# Patient Record
Sex: Female | Born: 1955
Health system: Southern US, Community
[De-identification: ages and names within clinical notes are randomized; demographics above are authoritative.]

## PROBLEM LIST (undated history)

## (undated) DIAGNOSIS — I471 Supraventricular tachycardia, unspecified: Secondary | ICD-10-CM

## (undated) DIAGNOSIS — E663 Overweight: Secondary | ICD-10-CM

## (undated) DIAGNOSIS — G4730 Sleep apnea, unspecified: Secondary | ICD-10-CM

## (undated) DIAGNOSIS — F419 Anxiety disorder, unspecified: Secondary | ICD-10-CM

## (undated) DIAGNOSIS — G4700 Insomnia, unspecified: Secondary | ICD-10-CM

## (undated) DIAGNOSIS — C50919 Malignant neoplasm of unspecified site of unspecified female breast: Secondary | ICD-10-CM

## (undated) DIAGNOSIS — M1990 Unspecified osteoarthritis, unspecified site: Secondary | ICD-10-CM

## (undated) DIAGNOSIS — M549 Dorsalgia, unspecified: Secondary | ICD-10-CM

## (undated) DIAGNOSIS — Z923 Personal history of irradiation: Secondary | ICD-10-CM

## (undated) DIAGNOSIS — I4719 Other supraventricular tachycardia: Secondary | ICD-10-CM

## (undated) DIAGNOSIS — K529 Noninfective gastroenteritis and colitis, unspecified: Secondary | ICD-10-CM

## (undated) DIAGNOSIS — E785 Hyperlipidemia, unspecified: Secondary | ICD-10-CM

## (undated) HISTORY — PX: COLONOSCOPY: SHX174

## (undated) HISTORY — DX: Supraventricular tachycardia, unspecified: I47.10

## (undated) HISTORY — DX: Anxiety disorder, unspecified: F41.9

## (undated) HISTORY — PX: TUBAL LIGATION: SHX77

## (undated) HISTORY — DX: Overweight: E66.3

## (undated) HISTORY — DX: Hyperlipidemia, unspecified: E78.5

## (undated) HISTORY — PX: CYSTOSCOPY: SUR368

## (undated) HISTORY — DX: Supraventricular tachycardia: I47.1

---

## 1998-03-13 ENCOUNTER — Other Ambulatory Visit: Admission: RE | Admit: 1998-03-13 | Discharge: 1998-03-13 | Payer: Self-pay | Admitting: Obstetrics and Gynecology

## 1999-05-02 ENCOUNTER — Encounter (INDEPENDENT_AMBULATORY_CARE_PROVIDER_SITE_OTHER): Payer: Self-pay | Admitting: Specialist

## 1999-05-02 ENCOUNTER — Other Ambulatory Visit: Admission: RE | Admit: 1999-05-02 | Discharge: 1999-05-02 | Payer: Self-pay | Admitting: Surgery

## 1999-05-02 HISTORY — PX: BREAST BIOPSY: SHX20

## 2000-03-16 ENCOUNTER — Encounter: Admission: RE | Admit: 2000-03-16 | Discharge: 2000-03-16 | Payer: Self-pay | Admitting: Internal Medicine

## 2000-03-16 ENCOUNTER — Encounter: Payer: Self-pay | Admitting: Internal Medicine

## 2004-02-16 ENCOUNTER — Emergency Department (HOSPITAL_COMMUNITY): Admission: EM | Admit: 2004-02-16 | Discharge: 2004-02-16 | Payer: Self-pay | Admitting: Emergency Medicine

## 2004-03-26 ENCOUNTER — Encounter: Payer: Self-pay | Admitting: Surgery

## 2004-03-28 ENCOUNTER — Inpatient Hospital Stay (HOSPITAL_COMMUNITY): Admission: RE | Admit: 2004-03-28 | Discharge: 2004-03-30 | Payer: Self-pay | Admitting: Surgery

## 2004-03-28 ENCOUNTER — Ambulatory Visit: Payer: Self-pay | Admitting: Internal Medicine

## 2004-06-03 ENCOUNTER — Ambulatory Visit: Payer: Self-pay | Admitting: Internal Medicine

## 2008-12-30 ENCOUNTER — Emergency Department (HOSPITAL_COMMUNITY): Admission: EM | Admit: 2008-12-30 | Discharge: 2008-12-30 | Payer: Self-pay | Admitting: Emergency Medicine

## 2010-06-15 ENCOUNTER — Encounter: Payer: Self-pay | Admitting: Surgery

## 2010-07-10 ENCOUNTER — Encounter (HOSPITAL_COMMUNITY): Payer: Self-pay

## 2010-07-18 ENCOUNTER — Ambulatory Visit (HOSPITAL_COMMUNITY)
Admission: RE | Admit: 2010-07-18 | Discharge: 2010-07-18 | Disposition: A | Discharge status: Home / Self Care | Payer: 59 | Source: Ambulatory Visit | Attending: Interventional Cardiology | Admitting: Interventional Cardiology

## 2010-07-18 DIAGNOSIS — R000 Tachycardia, unspecified: Secondary | ICD-10-CM | POA: Insufficient documentation

## 2010-07-26 ENCOUNTER — Other Ambulatory Visit (HOSPITAL_COMMUNITY): Payer: Self-pay | Admitting: Sports Medicine

## 2010-07-26 DIAGNOSIS — S83209A Unspecified tear of unspecified meniscus, current injury, unspecified knee, initial encounter: Secondary | ICD-10-CM

## 2010-08-06 ENCOUNTER — Other Ambulatory Visit (HOSPITAL_COMMUNITY): Payer: 59

## 2010-10-11 NOTE — Discharge Summary (Signed)
NAME:  Pamela Underwood, Pamela Underwood             ACCOUNT NO.:  [id number]   MEDICAL RECORD NO.:  [id number]          PATIENT TYPE:  INP   LOCATION:  0359                         FACILITY:  [facility]   PHYSICIAN:  Thornton Park. Daphine Deutscher, MD  DATE OF BIRTH:  Sep 28, 1955   DATE OF ADMISSION:  03/27/2004  DATE OF DISCHARGE:  03/30/2004                                 DISCHARGE SUMMARY   CHIEF COMPLAINT:  Fever, increasing truncal and extremity rash.   HISTORY OF PRESENT ILLNESS:  Pamela Underwood is a 55 year old white female,  [employer] nurse, who had the onset of a rash beginning about 3 days  prior to admission.  This spread under her trunk and then later to her  extremities.  She was seen at [facility] and received some  Levaquin (2 doses) and a steroid dose-pack which she had began taking  earlier on the day of admission.  Today, the day of admission, she began  having a fever up to 101 and was seen by me at the home and was subsequently  referred for labs at [facility].  Her white count was noted to have a count  of 18,100 with 93% granulocytes and no eosinophils.  Urine was positive for  blood, and my concern was that this could possibly be a manifestation of  sepsis and not allergic reaction.   PAST MEDICAL HISTORY:   CURRENT MEDICATIONS:  1.  Diltiazem ER 180 mg daily.  2.  Toprol XL 50 mg daily.  3.  Fleconide per Dr. Evette Georges since September 2005 for PAT.   ALLERGIES:  The patient has no known allergies.   There was also a question of an anxiolytic which she had taken and started  recently, but I do not have the name of that.   PRIOR SURGERY:  1.  Left breast biopsy many years ago.  2.  Three live vaginal births.   PHYSICAL EXAMINATION:  GENERAL:  Florid rash, irregularly raised on the  trunk and extremities but no petechiae with minimal itching.  HEENT:  Remarkable.  There was no conjunctivitis and no petechiae.  NECK:  Supple without meningismus.  CHEST:  Clear  to auscultation.  HEART:  Sinus rhythm without murmurs or gallops.  ABDOMEN:  Nontender.  EXTREMITIES:  Full range of motion except for the rash as noted.  __________  Fever rash, elevated white count.  Work-up for infectious autoimmune or  allergic reaction.   HOSPITAL COURSE:  Ms. Hosack was seen initially and admitted to the [facility]  ER but transferred to [facility] because of a lack of beds.  She was seen by the  [facility], and I called Dr. Merlene Laughter, who was potentially  covering for Dr. Amil Amen.  Dr. Rito Ehrlich saw the patient in consultation as  well.  I obtained a consultation from Dr. Donzetta Starch, who saw her rash and  was concerned this was more of an allergic reaction, likely secondary to  medication hypersensitivity reaction.  He felt it was not consistent with  [facility] Fever or any other etiologic agent such as that.  He  felt the etiology agent was most likely the antidepressant medicine she had  started 2 weeks prior to this eruption.  The name of the medicine was not  known by the patient.   She continued to have this rash and become more florid.  She had 3 sweats  one night, and I got Cliffton Asters, MD, to see her, who felt it was probably  a resolving drug reaction as well.  She was to continue on the 10 day  steroid taper.  She was ready to be discharged on November 5 and was  discharged by Dr. Jamey Ripa.  She was to take steroid prednisone taper at home.  We will be following her up in the office via the office.   FINAL DIAGNOSIS:  Severe allergic reaction, possibly secondary to unknown  antidepressant that the patient was started on back in September.     Matt   MBM/MEDQ  D:  04/10/2004  T:  04/11/2004  Job:  [id number]   cc:   Evette Georges, MD   Rocco Serene, M.D.  [address] [place]  [place] [address]  Fax: [phone]   Cliffton Asters, M.D.  [address] [place]  [place] [address]  Fax: [phone]

## 2010-11-11 ENCOUNTER — Other Ambulatory Visit: Payer: Self-pay | Admitting: Sports Medicine

## 2010-11-11 DIAGNOSIS — M545 Low back pain: Secondary | ICD-10-CM

## 2010-11-16 ENCOUNTER — Ambulatory Visit
Admission: RE | Admit: 2010-11-16 | Discharge: 2010-11-16 | Disposition: A | Discharge status: Home / Self Care | Payer: 59 | Source: Ambulatory Visit | Attending: Sports Medicine | Admitting: Sports Medicine

## 2010-11-16 DIAGNOSIS — M545 Low back pain: Secondary | ICD-10-CM

## 2010-11-28 ENCOUNTER — Encounter: Payer: Self-pay | Admitting: Family Medicine

## 2011-01-10 ENCOUNTER — Ambulatory Visit: Payer: 59 | Attending: Sports Medicine | Admitting: Physical Therapy

## 2011-01-10 DIAGNOSIS — R5381 Other malaise: Secondary | ICD-10-CM | POA: Insufficient documentation

## 2011-01-10 DIAGNOSIS — M545 Low back pain, unspecified: Secondary | ICD-10-CM | POA: Insufficient documentation

## 2011-01-10 DIAGNOSIS — M6281 Muscle weakness (generalized): Secondary | ICD-10-CM | POA: Insufficient documentation

## 2011-01-10 DIAGNOSIS — IMO0001 Reserved for inherently not codable concepts without codable children: Secondary | ICD-10-CM | POA: Insufficient documentation

## 2011-01-13 ENCOUNTER — Ambulatory Visit: Payer: 59

## 2011-01-15 ENCOUNTER — Ambulatory Visit: Payer: 59 | Admitting: Physical Therapy

## 2011-01-20 ENCOUNTER — Ambulatory Visit: Payer: 59

## 2011-01-23 ENCOUNTER — Ambulatory Visit: Payer: 59 | Admitting: Physical Therapy

## 2011-01-28 ENCOUNTER — Encounter: Payer: 59 | Admitting: Physical Therapy

## 2011-01-30 ENCOUNTER — Encounter: Payer: 59 | Admitting: Physical Therapy

## 2011-02-06 ENCOUNTER — Ambulatory Visit: Payer: 59 | Attending: Sports Medicine | Admitting: Physical Therapy

## 2011-02-06 DIAGNOSIS — M545 Low back pain, unspecified: Secondary | ICD-10-CM | POA: Insufficient documentation

## 2011-02-06 DIAGNOSIS — IMO0001 Reserved for inherently not codable concepts without codable children: Secondary | ICD-10-CM | POA: Insufficient documentation

## 2011-02-06 DIAGNOSIS — R5381 Other malaise: Secondary | ICD-10-CM | POA: Insufficient documentation

## 2011-02-06 DIAGNOSIS — M6281 Muscle weakness (generalized): Secondary | ICD-10-CM | POA: Insufficient documentation

## 2011-02-11 ENCOUNTER — Ambulatory Visit: Payer: 59 | Admitting: Physical Therapy

## 2011-02-17 ENCOUNTER — Ambulatory Visit: Payer: 59

## 2011-02-25 ENCOUNTER — Encounter: Payer: 59 | Admitting: Physical Therapy

## 2011-02-27 ENCOUNTER — Encounter: Payer: 59 | Admitting: Physical Therapy

## 2011-03-04 ENCOUNTER — Encounter: Payer: 59 | Admitting: Physical Therapy

## 2011-03-06 ENCOUNTER — Encounter: Payer: 59 | Admitting: Physical Therapy

## 2011-03-19 ENCOUNTER — Ambulatory Visit: Payer: 59 | Attending: Sports Medicine

## 2011-03-19 DIAGNOSIS — M6281 Muscle weakness (generalized): Secondary | ICD-10-CM | POA: Insufficient documentation

## 2011-03-19 DIAGNOSIS — R5381 Other malaise: Secondary | ICD-10-CM | POA: Insufficient documentation

## 2011-03-19 DIAGNOSIS — M545 Low back pain, unspecified: Secondary | ICD-10-CM | POA: Insufficient documentation

## 2011-03-19 DIAGNOSIS — IMO0001 Reserved for inherently not codable concepts without codable children: Secondary | ICD-10-CM | POA: Insufficient documentation

## 2011-03-26 ENCOUNTER — Ambulatory Visit: Payer: 59

## 2011-07-15 ENCOUNTER — Encounter: Payer: Self-pay | Admitting: Gastroenterology

## 2011-08-13 ENCOUNTER — Encounter: Payer: 59 | Admitting: Gastroenterology

## 2011-08-20 ENCOUNTER — Encounter (HOSPITAL_COMMUNITY): Payer: Self-pay | Admitting: *Deleted

## 2011-08-20 ENCOUNTER — Inpatient Hospital Stay (HOSPITAL_COMMUNITY)
Admission: EM | Admit: 2011-08-20 | Discharge: 2011-08-23 | DRG: 392 | Disposition: A | Discharge status: Home / Self Care | Payer: 59 | Attending: General Surgery | Admitting: General Surgery

## 2011-08-20 ENCOUNTER — Emergency Department (HOSPITAL_COMMUNITY): Payer: 59

## 2011-08-20 DIAGNOSIS — F411 Generalized anxiety disorder: Secondary | ICD-10-CM | POA: Diagnosis present

## 2011-08-20 DIAGNOSIS — R109 Unspecified abdominal pain: Secondary | ICD-10-CM

## 2011-08-20 DIAGNOSIS — K5289 Other specified noninfective gastroenteritis and colitis: Principal | ICD-10-CM | POA: Diagnosis present

## 2011-08-20 DIAGNOSIS — Z79899 Other long term (current) drug therapy: Secondary | ICD-10-CM

## 2011-08-20 HISTORY — DX: Supraventricular tachycardia: I47.1

## 2011-08-20 HISTORY — DX: Other supraventricular tachycardia: I47.19

## 2011-08-20 LAB — BASIC METABOLIC PANEL
BUN: 13 mg/dL (ref 6–23)
Calcium: 10.6 mg/dL — ABNORMAL HIGH (ref 8.4–10.5)
Creatinine, Ser: 0.64 mg/dL (ref 0.50–1.10)
GFR calc Af Amer: >90 mL/min (ref >90)
GFR calc non Af Amer: >90 mL/min (ref >90)
Glucose, Bld: 125 mg/dL — ABNORMAL HIGH (ref 70–99)
Potassium: 4.2 mEq/L (ref 3.5–5.1)

## 2011-08-20 LAB — URINALYSIS, ROUTINE W REFLEX MICROSCOPIC
Glucose, UA: NEGATIVE mg/dL
Leukocytes, UA: NEGATIVE
Specific Gravity, Urine: 1.014 (ref 1.005–1.030)
pH: 7 (ref 5.0–8.0)

## 2011-08-20 LAB — CBC
Hemoglobin: 14.5 g/dL (ref 12.0–15.0)
MCH: 32 pg (ref 26.0–34.0)
Platelets: 187 10*3/uL (ref 150–400)
RBC: 4.53 MIL/uL (ref 3.87–5.11)
WBC: 15.2 10*3/uL — ABNORMAL HIGH (ref 4.0–10.5)

## 2011-08-20 LAB — URINE MICROSCOPIC-ADD ON

## 2011-08-20 LAB — DIFFERENTIAL
Eosinophils Absolute: 0.1 10*3/uL (ref 0.0–0.7)
Lymphocytes Relative: 10 % — ABNORMAL LOW (ref 12–46)
Lymphs Abs: 1.6 10*3/uL (ref 0.7–4.0)
Monocytes Relative: 5 % (ref 3–12)
Neutrophils Relative %: 84 % — ABNORMAL HIGH (ref 43–77)

## 2011-08-20 MED ORDER — ACETAMINOPHEN 650 MG RE SUPP
RECTAL | Status: AC
  Filled 2011-08-20: qty 1

## 2011-08-20 MED ORDER — HYDROMORPHONE HCL PF 1 MG/ML IJ SOLN
1.0000 mg | Freq: Once | INTRAMUSCULAR | Status: AC
  Administered 2011-08-20: 1 mg via INTRAVENOUS

## 2011-08-20 MED ORDER — IOHEXOL 300 MG/ML  SOLN
20.0000 mL | INTRAMUSCULAR | Status: AC
  Administered 2011-08-20 (×2): 20 mL via ORAL

## 2011-08-20 MED ORDER — HEPARIN SODIUM (PORCINE) 5000 UNIT/ML IJ SOLN
5000.0000 [IU] | Freq: Three times a day (TID) | INTRAMUSCULAR | Status: DC
  Administered 2011-08-20 – 2011-08-23 (×8): 5000 [IU] via SUBCUTANEOUS
  Filled 2011-08-20 (×13): qty 1

## 2011-08-20 MED ORDER — HYDROMORPHONE HCL PF 1 MG/ML IJ SOLN
1.0000 mg | Freq: Once | INTRAMUSCULAR | Status: AC
  Administered 2011-08-20: 1 mg via INTRAVENOUS
  Filled 2011-08-20: qty 1

## 2011-08-20 MED ORDER — ZOLPIDEM TARTRATE 5 MG PO TABS
5.0000 mg | ORAL_TABLET | Freq: Every evening | ORAL | Status: DC | PRN
  Administered 2011-08-20 – 2011-08-23 (×3): 5 mg via ORAL
  Filled 2011-08-20 (×3): qty 1

## 2011-08-20 MED ORDER — KCL IN DEXTROSE-NACL 20-5-0.9 MEQ/L-%-% IV SOLN
INTRAVENOUS | Status: DC
  Administered 2011-08-20 – 2011-08-22 (×3): via INTRAVENOUS
  Filled 2011-08-20 (×8): qty 1000

## 2011-08-20 MED ORDER — CIPROFLOXACIN IN D5W 400 MG/200ML IV SOLN
400.0000 mg | Freq: Two times a day (BID) | INTRAVENOUS | Status: DC
  Administered 2011-08-20 – 2011-08-23 (×6): 400 mg via INTRAVENOUS
  Filled 2011-08-20 (×7): qty 200

## 2011-08-20 MED ORDER — PANTOPRAZOLE SODIUM 40 MG IV SOLR
40.0000 mg | Freq: Two times a day (BID) | INTRAVENOUS | Status: DC
  Administered 2011-08-20 – 2011-08-23 (×6): 40 mg via INTRAVENOUS
  Filled 2011-08-20 (×9): qty 40

## 2011-08-20 MED ORDER — PROPAFENONE HCL ER 225 MG PO CP12
225.0000 mg | ORAL_CAPSULE | Freq: Two times a day (BID) | ORAL | Status: DC
  Administered 2011-08-20 – 2011-08-23 (×6): 225 mg via ORAL
  Filled 2011-08-20 (×8): qty 1

## 2011-08-20 MED ORDER — ONDANSETRON HCL 4 MG/2ML IJ SOLN: 4.0000 mg | Freq: Four times a day (QID) | INTRAMUSCULAR | Status: DC | PRN

## 2011-08-20 MED ORDER — METRONIDAZOLE IN NACL 5-0.79 MG/ML-% IV SOLN
500.0000 mg | Freq: Three times a day (TID) | INTRAVENOUS | Status: DC
  Administered 2011-08-20 – 2011-08-23 (×9): 500 mg via INTRAVENOUS
  Filled 2011-08-20 (×12): qty 100

## 2011-08-20 MED ORDER — ONDANSETRON HCL 4 MG/2ML IJ SOLN
4.0000 mg | Freq: Once | INTRAMUSCULAR | Status: AC
  Administered 2011-08-20: 4 mg via INTRAVENOUS
  Filled 2011-08-20: qty 2

## 2011-08-20 MED ORDER — HYDROCODONE-ACETAMINOPHEN 5-325 MG PO TABS
1.0000 | ORAL_TABLET | ORAL | Status: DC | PRN
  Administered 2011-08-20: 2 via ORAL
  Administered 2011-08-21: 1 via ORAL
  Administered 2011-08-21 (×2): 2 via ORAL
  Administered 2011-08-21: 1 via ORAL
  Administered 2011-08-22 (×3): 2 via ORAL
  Administered 2011-08-22: 1 via ORAL
  Administered 2011-08-22 – 2011-08-23 (×2): 2 via ORAL
  Filled 2011-08-20 (×2): qty 2
  Filled 2011-08-20: qty 1
  Filled 2011-08-20 (×3): qty 2
  Filled 2011-08-20: qty 1
  Filled 2011-08-20 (×5): qty 2

## 2011-08-20 MED ORDER — MORPHINE SULFATE 4 MG/ML IJ SOLN
4.0000 mg | Freq: Once | INTRAMUSCULAR | Status: AC
  Administered 2011-08-20: 4 mg via INTRAVENOUS

## 2011-08-20 MED ORDER — IOHEXOL 300 MG/ML  SOLN
100.0000 mL | Freq: Once | INTRAMUSCULAR | Status: AC | PRN
  Administered 2011-08-20: 100 mL via INTRAVENOUS

## 2011-08-20 MED ORDER — ACETAMINOPHEN 650 MG RE SUPP
650.0000 mg | Freq: Once | RECTAL | Status: AC
  Administered 2011-08-20: 650 mg via RECTAL

## 2011-08-20 MED ORDER — HYDROMORPHONE HCL PF 1 MG/ML IJ SOLN
INTRAMUSCULAR | Status: AC
  Filled 2011-08-20: qty 1

## 2011-08-20 MED ORDER — SODIUM CHLORIDE 0.9 % IV SOLN
Freq: Once | INTRAVENOUS | Status: AC
  Administered 2011-08-20: 12:00:00 via INTRAVENOUS

## 2011-08-20 MED ORDER — ALPRAZOLAM 0.5 MG PO TABS: 0.5000 mg | ORAL_TABLET | Freq: Every evening | ORAL | Status: DC | PRN

## 2011-08-20 MED ORDER — MORPHINE SULFATE 2 MG/ML IJ SOLN
2.0000 mg | INTRAMUSCULAR | Status: DC | PRN
  Administered 2011-08-20: 4 mg via INTRAVENOUS
  Administered 2011-08-20 (×3): 2 mg via INTRAVENOUS
  Administered 2011-08-21: 4 mg via INTRAVENOUS
  Filled 2011-08-20: qty 2
  Filled 2011-08-20 (×3): qty 1
  Filled 2011-08-20: qty 2

## 2011-08-20 MED ORDER — MORPHINE SULFATE 4 MG/ML IJ SOLN
INTRAMUSCULAR | Status: AC
  Filled 2011-08-20: qty 1

## 2011-08-20 NOTE — ED Notes (Signed)
Pt had some cramping this am and went to work and now with tenderness and pain to left side of abdomen and friend did not hear bowel sounds on that side

## 2011-08-20 NOTE — ED Notes (Signed)
Pt shivering and Pamela Underwood, EMT checked her temp and it was 101.9. Informed pt that we couldn't add more warm blankets with her current temperature. Pt stated that she understood.

## 2011-08-20 NOTE — ED Notes (Signed)
Pt states she was on a cruise for past week. llq pain started yesterday and worse today. Some diarrhea. No vomiting. Skin w/d, resp e/u.

## 2011-08-20 NOTE — H&P (Signed)
Pamela Underwood is an 56 y.o. female.   Chief Complaint: abdominal pain HPI: patient is a 56 year old female who awoke this morning feeling well. 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 Within about an hour this the came very severe. She describes a pressure or aching pain that waxes and wanes. She felt she needed to have a bowel movement but this did not help. The pain intensified and she presented to the emergency room. Since presenting in the emergency room she has had fever to 103. She has not had nausea or vomiting. Normal bowel movement yesterday. No melena or hematochezia. She has no history of significant chronic GI complaints were similar previous episodes. Travel history is significant for returning from a trip to [place] about 4 days ago. No 1 else in the family has been ill. No urinary symptoms appear  Past Medical History  Diagnosis Date  . Atrial tachycardia     Past Surgical History  Procedure Date  . Tubal ligation   . Back surgery     History reviewed. No pertinent family history. Social History:  reports that she has never smoked. She does not have any smokeless tobacco history on file. She reports that she drinks alcohol. She reports that she does not use illicit drugs.  Allergies:  Allergies  Allergen Reactions  . Lexapro Other (See Comments)    Hives, welts, all over skin rash    Medications Prior to Admission  Medication Dose Route Frequency Provider Last Rate Last Dose  . 0.9 %  sodium chloride infusion   Intravenous Once Toy Baker, MD 125 mL/hr at 08/20/11 1223    . acetaminophen (TYLENOL) 650 MG suppository           . acetaminophen (TYLENOL) suppository 650 mg  650 mg Rectal Once Dorthula Matas, PA   650 mg at 08/20/11 1317  . HYDROmorphone (DILAUDID) 1 MG/ML injection           . HYDROmorphone (DILAUDID) injection 1 mg  1 mg Intravenous Once  Toy Baker, MD   1 mg at 08/20/11 1215  . HYDROmorphone (DILAUDID) injection 1 mg  1 mg Intravenous Once Dorthula Matas, PA   1 mg at 08/20/11 1351  . HYDROmorphone (DILAUDID) injection 1 mg  1 mg Intravenous Once Dorthula Matas, PA   1 mg at 08/20/11 1614  . iohexol (OMNIPAQUE) 300 MG/ML solution 100 mL  100 mL Intravenous Once PRN Medication Radiologist, MD   100 mL at 08/20/11 1439  . iohexol (OMNIPAQUE) 300 MG/ML solution 20 mL  20 mL Oral Q1 Hr x 2 Medication Radiologist, MD   20 mL at 08/20/11 1353  . ondansetron (ZOFRAN) injection 4 mg  4 mg Intravenous Once Toy Baker, MD   4 mg at 08/20/11 1215   Medications Prior to Admission  Medication Sig Dispense Refill  . ALPRAZolam (XANAX) 0.5 MG tablet Take 0.5 mg by mouth at bedtime as needed. For stress/anxiety      . aspirin 81 MG tablet Take 81 mg by mouth daily.        Rhythmol bid Diltiazem daily doses unknown-pharmacy to verify    Results for orders placed during the hospital encounter of 08/20/11 (from the past 48 hour(s))  CBC     Status: Abnormal   Collection Time   08/20/11 12:04 PM  Component Value Range Comment   WBC 15.2 (*) 4.0 - 10.5 (K/uL)    RBC 4.53  3.87 - 5.11 (MIL/uL)    Hemoglobin 14.5  12.0 - 15.0 (g/dL)    HCT 21.3  08.6 - 57.8 (%)    MCV 92.1  78.0 - 100.0 (fL)    MCH 32.0  26.0 - 34.0 (pg)    MCHC 34.8  30.0 - 36.0 (g/dL)    RDW 46.9  62.9 - 52.8 (%)    Platelets 187  150 - 400 (K/uL)   DIFFERENTIAL     Status: Abnormal   Collection Time   08/20/11 12:04 PM      Component Value Range Comment   Neutrophils Relative 84 (*) 43 - 77 (%)    Neutro Abs 12.8 (*) 1.7 - 7.7 (K/uL)    Lymphocytes Relative 10 (*) 12 - 46 (%)    Lymphs Abs 1.6  0.7 - 4.0 (K/uL)    Monocytes Relative 5  3 - 12 (%)    Monocytes Absolute 0.8  0.1 - 1.0 (K/uL)    Eosinophils Relative 0  0 - 5 (%)    Eosinophils Absolute 0.1  0.0 - 0.7 (K/uL)    Basophils Relative 0  0 - 1 (%)    Basophils Absolute 0.0  0.0 - 0.1  (K/uL)   BASIC METABOLIC PANEL     Status: Abnormal   Collection Time   08/20/11 12:04 PM      Component Value Range Comment   Sodium 138  135 - 145 (mEq/L)    Potassium 4.2  3.5 - 5.1 (mEq/L)    Chloride 97  96 - 112 (mEq/L)    CO2 27  19 - 32 (mEq/L)    Glucose, Bld 125 (*) 70 - 99 (mg/dL)    BUN 13  6 - 23 (mg/dL)    Creatinine, Ser 4.13  0.50 - 1.10 (mg/dL)    Calcium 24.4 (*) 8.4 - 10.5 (mg/dL)    GFR calc non Af Amer >90  >90 (mL/min)    GFR calc Af Amer >90  >90 (mL/min)   URINALYSIS, ROUTINE W REFLEX MICROSCOPIC     Status: Abnormal   Collection Time   08/20/11 12:14 PM      Component Value Range Comment   Color, Urine YELLOW  YELLOW     APPearance CLEAR  CLEAR     Specific Gravity, Urine 1.014  1.005 - 1.030     pH 7.0  5.0 - 8.0     Glucose, UA NEGATIVE  NEGATIVE (mg/dL)    Hgb urine dipstick MODERATE (*) NEGATIVE     Bilirubin Urine NEGATIVE  NEGATIVE     Ketones, ur 15 (*) NEGATIVE (mg/dL)    Protein, ur NEGATIVE  NEGATIVE (mg/dL)    Urobilinogen, UA 1.0  0.0 - 1.0 (mg/dL)    Nitrite NEGATIVE  NEGATIVE     Leukocytes, UA NEGATIVE  NEGATIVE    URINE MICROSCOPIC-ADD ON     Status: Normal   Collection Time   08/20/11 12:14 PM      Component Value Range Comment   Squamous Epithelial / LPF RARE  RARE     WBC, UA 0-2  <3 (WBC/hpf)    RBC / HPF 7-10  <3 (RBC/hpf)    Bacteria, UA RARE  RARE     Ct Abdomen Pelvis W Contrast  08/20/2011  *RADIOLOGY REPORT*  Clinical Data: Abdominal pain  CT ABDOMEN AND PELVIS WITH CONTRAST  Technique:  Multidetector CT imaging of the abdomen and pelvis was performed following the standard protocol during bolus administration of intravenous contrast.  Contrast:  100 ml Omnipaque-300 IV  Comparison: None.  Findings: There is significant thickening of the proximal small bowel involving the fourth portion the duodenum, ligament of Treitz, and proximal jejunum. Mucosa is significantly thickened in this area but there is no stranding in the  mesentery.  No fluid or abscess is present.  Distal to this thickened segment is a segment of mildly dilated jejunum without wall thickening.  Remainder of the small bowel appears normal.  Appendix is normal and the colon is normal.  Lung bases are clear.  Liver and gallbladder and bile ducts are normal.  Pancreas spleen and kidneys are normal.  Chronic compression fracture of L2.  Disc degeneration at L5-S1.  IMPRESSION: Extensive thickening of the distal duodenum and proximal jejunum. Differential considerations include bowel ischemia and infection. Bowel tumor not considered likely.  No free fluid or adenopathy.  Original Report Authenticated By: Camelia Phenes, M.D.    Review of Systems  Constitutional: Positive for fever, chills and malaise/fatigue. Negative for weight loss.  Eyes: Negative.   Respiratory: Negative for cough and shortness of breath.   Cardiovascular: Positive for palpitations. Negative for chest pain.  Gastrointestinal: Positive for abdominal pain. Negative for heartburn, nausea, vomiting, diarrhea, constipation, blood in stool and melena.  Genitourinary: Negative.   Musculoskeletal: Negative.   Skin: Negative for rash.  Psychiatric/Behavioral: The patient is nervous/anxious.     Blood pressure 163/60, pulse 122, temperature 102.4 F (39.1 C), temperature source Oral, resp. rate 22, SpO2 98.00%. Physical Exam  General: Alert, mildly obese Caucasian female who appears uncomfortable. Skin: Warm and dry without rash or infection. HEENT: No palpable masses or thyromegaly. Sclera nonicteric. Pupils equal round and reactive. Oropharynx clear. Lymph nodes: No cervical, supraclavicular, or inguinal nodes palpable. Lungs: Breath sounds clear and equal without increased work of breathing Cardiovascular: moderate tachcardia without murmur. No JVD or edema. Peripheral pulses intact. Abdomen: Nondistended. Bowel sounds normal. There is localized moderate to marked tenderness in the  left upper quadrant with some guarding.. No masses palpable. No organomegaly. No palpable hernias. Extremities: No edema or joint swelling or deformity. No chronic venous stasis changes. Neurologic: Alert and fully oriented.  Assessment/Plan Acute left upper quadrant abdominal pain and fever with elevated white count. CT scan shows a significant enteritis involving the fourth portion of the duodenum and proximal jejunum. Differential includes infectious and ischemic etiologies. She has no risk factors or history of ischemia. This appears to be infectious, possibly bacterial or viral.  Patient will be admitted, IV antibiotics and bowel rest, IV Cipro and Flagyl with close observation.  Donis Pinder T 08/20/2011, 4:36 PM

## 2011-08-20 NOTE — ED Provider Notes (Signed)
History     CSN: [id number]  Arrival date & time 08/20/11  1113   First MD Initiated Contact with Patient 08/20/11 1145      Chief Complaint  Patient presents with  . Abdominal Pain    left side    (Consider location/radiation/quality/duration/timing/severity/associated sxs/prior treatment) Patient is a 56 y.o. female presenting with abdominal pain. The history is provided by the patient.  Abdominal Pain The primary symptoms of the illness include abdominal pain.   patient here with abdominal pain x1 day. Pain described as sharp localized to her left lower quadrant. Denies any fever, vomiting. Some constipation noted. No bloody stools. Denies any urinary symptoms. No prior history of same. Nothing makes her symptoms better or worse no medications used prior to arrival. She denies any vaginal bleeding or discharge  Past Medical History  Diagnosis Date  . Atrial tachycardia     Past Surgical History  Procedure Date  . Tubal ligation   . Back surgery     No family history on file.  History  Substance Use Topics  . Smoking status: Never Smoker   . Smokeless tobacco: Not on file  . Alcohol Use: Yes     occ    OB History    Grav Para Term Preterm Abortions TAB SAB Ect Mult Living                  Review of Systems  Gastrointestinal: Positive for abdominal pain.  All other systems reviewed and are negative.    Allergies  Lexapro  Home Medications   Current Outpatient Rx  Name Route Sig Dispense Refill  . ALPRAZOLAM 0.5 MG PO TABS Oral Take 0.5 mg by mouth at bedtime as needed.      . ASPIRIN 81 MG PO TABS Oral Take 81 mg by mouth daily.      Marland Kitchen VITAMIN D (CHOLECALCIFEROL) PO Oral Take 1 capsule by mouth daily.    Remus Loffler PO Oral Take 0.5 tablets by mouth at bedtime as needed.      BP 151/84  Pulse 122  Temp(Src) 98.9 F (37.2 C) (Oral)  Resp 20  SpO2 99%  Physical Exam  Nursing note and vitals reviewed. Constitutional: She is oriented to person,  place, and time. She appears well-developed and well-nourished.  Non-toxic appearance. No distress.  HENT:  Head: Normocephalic and atraumatic.  Eyes: Conjunctivae, EOM and lids are normal. Pupils are equal, round, and reactive to light.  Neck: Normal range of motion. Neck supple. No tracheal deviation present. No mass present.  Cardiovascular: Regular rhythm and normal heart sounds.  Tachycardia present.  Exam reveals no gallop.   No murmur heard. Pulmonary/Chest: Effort normal and breath sounds normal. No stridor. No respiratory distress. She has no decreased breath sounds. She has no wheezes. She has no rhonchi. She has no rales.  Abdominal: Soft. Normal appearance and bowel sounds are normal. She exhibits no distension. There is tenderness in the left lower quadrant. There is no rigidity, no rebound, no guarding and no CVA tenderness.  Musculoskeletal: Normal range of motion. She exhibits no edema and no tenderness.  Neurological: She is alert and oriented to person, place, and time. She has normal strength. No cranial nerve deficit or sensory deficit. GCS eye subscore is 4. GCS verbal subscore is 5. GCS motor subscore is 6.  Skin: Skin is warm and dry. No abrasion and no rash noted.  Psychiatric: Her speech is normal and behavior is normal. Her mood appears  anxious.    ED Course  Procedures (including critical care time)   Labs Reviewed  CBC  DIFFERENTIAL  URINALYSIS, ROUTINE W REFLEX MICROSCOPIC  URINE CULTURE  BASIC METABOLIC PANEL   No results found.   No diagnosis found.    MDM  Pt to have abd ct to r/o rupture diverticuli, will send to cdu to complete w/u        Toy Baker, MD 08/21/11 1818

## 2011-08-20 NOTE — ED Provider Notes (Signed)
Pt from [facility] triage with abdominal pain. The patient is now showing a fever of 101 and is tachycardic in the low 100's. 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 Daphine Deutscher, came to see patient in ED and feels that this is a rectal abscess. According to the nurse, he would like to be called with the CT abd/pelv results that are pending. The patient has been given 650 Acetaminophen suppository for fever and will be kept NPO until CT results.  Dr. Johna Sheriff [facility]) has evaluated patient and will be assuming care of the patient from this point.  Dorthula Matas, PA 08/20/11 1836

## 2011-08-20 NOTE — ED Notes (Signed)
Pt assisted to the bathroom and she urinated, no stool. Pt also c/o of h/a and asked for pain medication. T.GreenePA informed of pt's c/o and request.

## 2011-08-20 NOTE — ED Notes (Signed)
Called report to [place], Charity fundraiser.

## 2011-08-20 NOTE — ED Notes (Signed)
Pt was recently on cruise to [place] and develop some diarrhea but no pain until today. Pt having left sided abd pain and diarrhea. Pt comint to cdu and having ct scan with contrast.

## 2011-08-20 NOTE — ED Provider Notes (Signed)
Medical screening examination/treatment/procedure(s) were conducted as a shared visit with non-physician practitioner(s) and myself.  I personally evaluated the patient during the encounter  Toy Baker, MD 08/20/11 [phone]

## 2011-08-20 NOTE — ED Notes (Signed)
Pt given oral contrast to drink. On ascultation able to hear bowel sounds throughout, greater on the right than left. Pt reports that pain is decreased  Since receiving pain medication.

## 2011-08-20 NOTE — ED Notes (Signed)
Marlon Pel, PA into talk with pt about plan of care. General Surgery being paged currently.

## 2011-08-20 NOTE — ED Notes (Signed)
Radiology notified that pt finished drinking her contrast. Dr Sheron Nightingale here to see pt.

## 2011-08-21 LAB — CBC
HCT: 34.2 % — ABNORMAL LOW (ref 36.0–46.0)
Hemoglobin: 12.1 g/dL (ref 12.0–15.0)
MCH: 32.7 pg (ref 26.0–34.0)
MCHC: 35.4 g/dL (ref 30.0–36.0)
MCV: 92.4 fL (ref 78.0–100.0)
Platelets: 157 10*3/uL (ref 150–400)
RBC: 3.7 MIL/uL — ABNORMAL LOW (ref 3.87–5.11)
RDW: 12.3 % (ref 11.5–15.5)
WBC: 13.7 10*3/uL — ABNORMAL HIGH (ref 4.0–10.5)

## 2011-08-21 LAB — URINE CULTURE
Colony Count: NO GROWTH
Culture  Setup Time: 201303272243
Culture: NO GROWTH

## 2011-08-21 LAB — BASIC METABOLIC PANEL
BUN: 8 mg/dL (ref 6–23)
CO2: 28 mEq/L (ref 19–32)
Calcium: 8.8 mg/dL (ref 8.4–10.5)
Creatinine, Ser: 0.61 mg/dL (ref 0.50–1.10)
Glucose, Bld: 112 mg/dL — ABNORMAL HIGH (ref 70–99)

## 2011-08-21 MED ORDER — HYDROMORPHONE HCL PF 1 MG/ML IJ SOLN
1.0000 mg | INTRAMUSCULAR | Status: DC | PRN
  Administered 2011-08-21 (×2): 2 mg via INTRAVENOUS
  Administered 2011-08-21: 1 mg via INTRAVENOUS
  Filled 2011-08-21: qty 2
  Filled 2011-08-21: qty 1
  Filled 2011-08-21: qty 2

## 2011-08-21 NOTE — Progress Notes (Signed)
UR of chart complete.  

## 2011-08-21 NOTE — Progress Notes (Signed)
Patient ID: Pamela Underwood, female   DOB: November 13, 1955, 56 y.o.   MRN: [id number]    Subjective: Pain is a little better. No nausea  Objective: Vital signs in last 24 hours: Temp:  [97.7 F (36.5 C)-103.1 F (39.5 C)] 97.7 F (36.5 C) (03/28 0543) Pulse Rate:  [85-122] 85  (03/28 0543) Resp:  [18-22] 18  (03/28 0543) BP: (126-178)/(56-84) 126/78 mmHg (03/28 0543) SpO2:  [95 %-99 %] 95 % (03/28 0543) Last BM Date: 08/20/11  Intake/Output from previous day: 03/27 0701 - 03/28 0700 In: 1740 [P.O.:240; I.V.:1500] Out: -  Intake/Output this shift:   PE General appearance: alert,  no distress GI: abnormal findings:  moderate tenderness in the LUQ  Lab Results:   Basename 08/21/11 0515 08/20/11 1204  WBC 13.7* 15.2*  HGB 12.1 14.5  HCT 34.2* 41.7  PLT 157 187   BMET  Basename 08/21/11 0515 08/20/11 1204  NA 139 138  K 3.6 4.2  CL 104 97  CO2 28 27  GLUCOSE 112* 125*  BUN 8 13  CREATININE 0.61 0.64  CALCIUM 8.8 10.6*     Studies/Results: Ct Abdomen Pelvis W Contrast  08/20/2011  *RADIOLOGY REPORT*  Clinical Data: Abdominal pain  CT ABDOMEN AND PELVIS WITH CONTRAST  Technique:  Multidetector CT imaging of the abdomen and pelvis was performed following the standard protocol during bolus administration of intravenous contrast.  Contrast:  100 ml Omnipaque-300 IV  Comparison: None.  Findings: There is significant thickening of the proximal small bowel involving the fourth portion the duodenum, ligament of Treitz, and proximal jejunum. Mucosa is significantly thickened in this area but there is no stranding in the mesentery.  No fluid or abscess is present.  Distal to this thickened segment is a segment of mildly dilated jejunum without wall thickening.  Remainder of the small bowel appears normal.  Appendix is normal and the colon is normal.  Lung bases are clear.  Liver and gallbladder and bile ducts are normal.  Pancreas spleen and kidneys are normal.  Chronic compression  fracture of L2.  Disc degeneration at L5-S1.  IMPRESSION: Extensive thickening of the distal duodenum and proximal jejunum. Differential considerations include bowel ischemia and infection. Bowel tumor not considered likely.  No free fluid or adenopathy.  Original Report Authenticated By: Camelia Phenes, M.D.    Anti-infectives: Anti-infectives     Start     Dose/Rate Route Frequency Ordered Stop   08/20/11 1730   ciprofloxacin (CIPRO) IVPB 400 mg        400 mg 200 mL/hr over 60 Minutes Intravenous Every 12 hours 08/20/11 1716     08/20/11 1730   metroNIDAZOLE (FLAGYL) IVPB 500 mg        500 mg 100 mL/hr over 60 Minutes Intravenous Every 8 hours 08/20/11 1716            Assessment/Plan: Enteritis, probably infectious Some improvement with decreased WBC and afebrile Cont abx.  Start CL diet    LOS: 1 day    Ceilidh Torregrossa T 08/21/2011  j

## 2011-08-22 NOTE — Progress Notes (Signed)
Patient ID: Pamela Underwood, female   DOB: 1956-04-23, 56 y.o.   MRN: [id number]    Subjective: Pt reports pain is better overall. She started having loose stools today.  She is overall feeling much better.   Objective: Vital signs in last 24 hours: Temp:  [98.5 F (36.9 C)-99.6 F (37.6 C)] 99.6 F (37.6 C) (03/29 0553) Pulse Rate:  [86-89] 86  (03/29 0553) Resp:  [16-20] 20  (03/29 0553) BP: (133-146)/(71-82) 133/71 mmHg (03/29 0553) SpO2:  [95 %-98 %] 98 % (03/29 0553) Last BM Date: 08/21/11  Intake/Output from previous day: 03/28 0701 - 03/29 0700 In: 2886.7 [P.O.:1120; I.V.:1666.7; IV Piggyback:100] Out: 2 [Urine:2] Intake/Output this shift:   PE General appearance: alert,  no distress GI: abnormal findings:  moderate tenderness in the LUQ, but soft overall and not distended Lab Results:   Basename 08/21/11 0515 08/20/11 1204  WBC 13.7* 15.2*  HGB 12.1 14.5  HCT 34.2* 41.7  PLT 157 187   BMET  Basename 08/21/11 0515 08/20/11 1204  NA 139 138  K 3.6 4.2  CL 104 97  CO2 28 27  GLUCOSE 112* 125*  BUN 8 13  CREATININE 0.61 0.64  CALCIUM 8.8 10.6*     Studies/Results: Ct Abdomen Pelvis W Contrast  08/20/2011  *RADIOLOGY REPORT*  Clinical Data: Abdominal pain  CT ABDOMEN AND PELVIS WITH CONTRAST  Technique:  Multidetector CT imaging of the abdomen and pelvis was performed following the standard protocol during bolus administration of intravenous contrast.  Contrast:  100 ml Omnipaque-300 IV  Comparison: None.  Findings: There is significant thickening of the proximal small bowel involving the fourth portion the duodenum, ligament of Treitz, and proximal jejunum. Mucosa is significantly thickened in this area but there is no stranding in the mesentery.  No fluid or abscess is present.  Distal to this thickened segment is a segment of mildly dilated jejunum without wall thickening.  Remainder of the small bowel appears normal.  Appendix is normal and the colon is  normal.  Lung bases are clear.  Liver and gallbladder and bile ducts are normal.  Pancreas spleen and kidneys are normal.  Chronic compression fracture of L2.  Disc degeneration at L5-S1.  IMPRESSION: Extensive thickening of the distal duodenum and proximal jejunum. Differential considerations include bowel ischemia and infection. Bowel tumor not considered likely.  No free fluid or adenopathy.  Original Report Authenticated By: Camelia Phenes, M.D.    Anti-infectives: Anti-infectives     Start     Dose/Rate Route Frequency Ordered Stop   08/20/11 1730   ciprofloxacin (CIPRO) IVPB 400 mg        400 mg 200 mL/hr over 60 Minutes Intravenous Every 12 hours 08/20/11 1716     08/20/11 1730   metroNIDAZOLE (FLAGYL) IVPB 500 mg        500 mg 100 mL/hr over 60 Minutes Intravenous Every 8 hours 08/20/11 1716            Assessment/Plan: Enteritis, probably infectious  [Inflammation limited to distal duodenum/prox jejunum on CT scan] Will advance to low residue diet and monitor tolerance to po's CBC in am and consider transition over to po antibiotic's  Despite unclear diagnosis, patient is getting better. If she tolerates reg diet and her WBC is improved, probably home tomorrow.   LOS: 2 days    RAYBURN,SHAWN,PA-C Pager [phone] General Trauma Pager [phone]  Ovidio Kin, MD, [facility] Pager: [phone] Office phone:  [phone]

## 2011-08-23 LAB — CBC
MCH: 32.7 pg (ref 26.0–34.0)
Platelets: 167 10*3/uL (ref 150–400)
RBC: 3.76 MIL/uL — ABNORMAL LOW (ref 3.87–5.11)
RDW: 12.3 % (ref 11.5–15.5)
WBC: 8.4 10*3/uL (ref 4.0–10.5)

## 2011-08-23 MED ORDER — HYDROCODONE-ACETAMINOPHEN 5-325 MG PO TABS: 1.0000 | ORAL_TABLET | Freq: Four times a day (QID) | ORAL | Status: AC | PRN

## 2011-08-23 NOTE — Discharge Summary (Signed)
NAME:  EARLY, ORD NO.:  [id number]  MEDICAL RECORD NO.:  [id number]  LOCATION:  5035                         FACILITY:  [facility]  PHYSICIAN:  Sandria Bales. Ezzard Standing, M.D.  DATE OF BIRTH:  Aug 11, 1955  DATE OF ADMISSION:  08/20/2011 DATE OF DISCHARGE:  08/23/2011                              DISCHARGE SUMMARY  DISCHARGE DIAGNOSES: 1. Acute gastroenteritis, involving distal duodenum, proximal jejunum as measured by CT scan. 2. Anxiety.  OPERATIONS PERFORMED:  None.  HISTORY OF PRESENT ILLNESS:  Pamela Underwood is a 56 year old white female, who sees Dr. Harold Hedge for her primary medical care.  She awoke the morning of March 27, and went to work at [employer] and developed increasing left upper quadrant abdominal pain.  This became severe enough that at work, she went to the [facility] Emergency Room with a temperature of 103. She had no nausea or vomiting, had a normal bowel movement a day before presentation and no evidence of blood in her stool.  She has had no prior history of GI complaints.  She did return from a cruise trip that included landing in [place] about 4 days prior to presenting.  Her white blood count was 15,200 with 84% neutrophils, and a CT scan showed a thickening of her distal duodenum and  proximal jejunum.  She was admitted with a diagnosis of acute gastroenteritis with no obvious surgical cause for pain.  Her only medical issues is that she takes some Xanax for anxiety.  HOSPITAL COURSE:  She was placed on Cipro and Flagyl and given Protonix. She got better source steadily.  She is now 3 days into her hospitalization.  Her white blood count has returned to normal at 8,400. She did have stool sent off for C. diff which was negative.  She had stool sent off for cultures which is pending at the time of this dictation.    She is eating a regular diet, but still feels a little full filling after eating.  She is still having some irregular  bowel movements, but no blood in her stool, but she is ready for discharge.  DISCHARGE INSTRUCTIONS:  She will be given Vicodin for pain, but she will not be continued on antibiotics, and we found no bacterial source for her enteritis.   She will be out of work for 1 week's time.   She will be given Vicodin for pain, and her return appointment to office will be on a p.r.n. basis.   I told her if her symptoms resume, I would consider having one of gastroenterologist see her.   Sandria Bales. Ezzard Standing, M.D., FACS  DHN/MEDQ  D:  08/23/2011  T:  08/23/2011  Job:  [id number]  cc:   Guy Sandifer. Henderson Cloud, M.D. Corky Crafts, MD

## 2011-08-23 NOTE — Progress Notes (Signed)
D/C per ambulation per pt insistance. Pt stable,free of pain or nausea, steady gait. D/C with script,D/C instructions.

## 2011-08-23 NOTE — Progress Notes (Signed)
General Surgery Note  LOS: 3 days  PCP - J. Tomblin  Assessment/Plan: 1.  Enteritis, distal duodenum/prox jejunum.  (Note - she did just back from a cruise/visit to [place] 3 days prior to getting sick)  WBC now normal.  C. Difficile - neg - 08/22/2011  Clearly better.  Ready to go home.  Discharge instructions reviewed.  D/C dictated:    Subjective:  Doing better.  Taking reg diet. Objective:   Filed Vitals:   08/23/11 0553  BP: 144/56  Pulse: 87  Temp: 98.1 F (36.7 C)  Resp: 18     Intake/Output from previous day:  03/29 0701 - 03/30 0700 In: 1240 [P.O.:1000; I.V.:240] Out: -   Intake/Output this shift:  Total I/O In: 240 [P.O.:240] Out: -    Physical Exam:   General: WN mildly obese WF who is alert and oriented.    HEENT: Normal. Pupils equal. Good dentition. .   Lungs: Clear.   Abdomen: Soft.  BS present.   Neurologic:  Grossly intact to motor and sensory function.   Psychiatric: Has normal mood and affect. Behavior is normal   Lab Results:    Basename 08/23/11 0600 08/21/11 0515  WBC 8.4 13.7*  HGB 12.3 12.1  HCT 35.0* 34.2*  PLT 167 157    BMET   Basename 08/21/11 0515 08/20/11 1204  NA 139 138  K 3.6 4.2  CL 104 97  CO2 28 27  GLUCOSE 112* 125*  BUN 8 13  CREATININE 0.61 0.64  CALCIUM 8.8 10.6*    PT/INR  No results found for this basename: LABPROT:2,INR:2 in the last 72 hours  ABG  No results found for this basename: PHART:2,PCO2:2,PO2:2,HCO3:2 in the last 72 hours   Studies/Results:  No results found.   Anti-infectives:   Anti-infectives     Start     Dose/Rate Route Frequency Ordered Stop   08/20/11 1730   ciprofloxacin (CIPRO) IVPB 400 mg        400 mg 200 mL/hr over 60 Minutes Intravenous Every 12 hours 08/20/11 1716     08/20/11 1730   metroNIDAZOLE (FLAGYL) IVPB 500 mg        500 mg 100 mL/hr over 60 Minutes Intravenous Every 8 hours 08/20/11 1716            Ovidio Kin, MD, FACS Pager: [phone],   [place]  [facility]gery Office: [phone] 08/23/2011

## 2011-08-23 NOTE — Discharge Instructions (Signed)
[facility] - DISCHARGE INSTRUCTIONS TO PATIENT  Return to work on:  1 week  Activity:  Driving - may drive in 2 to 3 days, if you are feeling okay   Lifting - no limit  Diet:  Eat light until you think your GI track is getting better.  Follow up appointment:  No set follow up appointment unless you keep having symptoms.  Call Dr. Allene Pyo office [facility]) at [phone] for continued problems.  Medications and dosages:  Resume your home medications.  You have a prescription for:  Vicodin.  Call Dr. Ezzard Standing or his office  [phone]) if you have:  Temperature greater than 100.4,  Persistent nausea and vomiting,  Severe uncontrolled pain,  Redness, tenderness, or signs of infection (pain, swelling, redness, odor or green/yellow discharge around the site),  Difficulty breathing, headache or visual disturbances,  Any other questions or concerns you may have after discharge.  In an emergency, call 911 or go to an Emergency Department at a nearby hospital.

## 2011-08-25 LAB — OVA AND PARASITE EXAMINATION

## 2011-08-27 ENCOUNTER — Telehealth (INDEPENDENT_AMBULATORY_CARE_PROVIDER_SITE_OTHER): Payer: Self-pay

## 2011-08-27 NOTE — Telephone Encounter (Signed)
The patient called to complain of her fever returning.  Her temperature is 100 now.  She is feeling very tired.  She has no pain.  The patient is concerned about the fever and that it is related to what was going on in the hospital.  I read her discharge summary which said not to continue antibiotics but she wanted Dr Ezzard Standing to know.  I paged him and he gave an order for Cipro 500mg  po BID for one week.  I called this into [facility] on [address] [phone].  The pt is aware.

## 2011-10-02 ENCOUNTER — Other Ambulatory Visit (INDEPENDENT_AMBULATORY_CARE_PROVIDER_SITE_OTHER): Payer: Self-pay | Admitting: General Surgery

## 2011-10-02 ENCOUNTER — Other Ambulatory Visit (INDEPENDENT_AMBULATORY_CARE_PROVIDER_SITE_OTHER): Payer: Self-pay

## 2011-10-02 ENCOUNTER — Ambulatory Visit (HOSPITAL_COMMUNITY)
Admission: RE | Admit: 2011-10-02 | Discharge: 2011-10-02 | Disposition: A | Discharge status: Home / Self Care | Payer: 59 | Source: Ambulatory Visit | Attending: General Surgery | Admitting: General Surgery

## 2011-10-02 DIAGNOSIS — K5289 Other specified noninfective gastroenteritis and colitis: Secondary | ICD-10-CM | POA: Insufficient documentation

## 2011-10-02 DIAGNOSIS — R109 Unspecified abdominal pain: Secondary | ICD-10-CM | POA: Insufficient documentation

## 2011-10-02 MED ORDER — CIPROFLOXACIN HCL 500 MG PO TABS: 500.0000 mg | ORAL_TABLET | Freq: Two times a day (BID) | ORAL | Status: AC

## 2011-10-02 MED ORDER — IOHEXOL 300 MG/ML  SOLN
80.0000 mL | Freq: Once | INTRAMUSCULAR | Status: AC | PRN
  Administered 2011-10-02: 80 mL via INTRAVENOUS

## 2011-10-02 MED ORDER — HYDROCODONE-ACETAMINOPHEN 5-325 MG PO TABS: 1.0000 | ORAL_TABLET | Freq: Four times a day (QID) | ORAL | Status: AC | PRN

## 2011-10-02 MED ORDER — METRONIDAZOLE 500 MG PO TABS: 500.0000 mg | ORAL_TABLET | Freq: Three times a day (TID) | ORAL | Status: AC

## 2012-10-08 ENCOUNTER — Ambulatory Visit (HOSPITAL_BASED_OUTPATIENT_CLINIC_OR_DEPARTMENT_OTHER): Payer: 59 | Attending: Interventional Cardiology

## 2012-10-08 VITALS — Ht 68.0 in | Wt 189.0 lb

## 2012-10-08 DIAGNOSIS — R0989 Other specified symptoms and signs involving the circulatory and respiratory systems: Secondary | ICD-10-CM | POA: Insufficient documentation

## 2012-10-08 DIAGNOSIS — G4733 Obstructive sleep apnea (adult) (pediatric): Secondary | ICD-10-CM | POA: Insufficient documentation

## 2012-10-08 DIAGNOSIS — R0609 Other forms of dyspnea: Secondary | ICD-10-CM | POA: Insufficient documentation

## 2012-10-16 DIAGNOSIS — G4733 Obstructive sleep apnea (adult) (pediatric): Secondary | ICD-10-CM

## 2012-10-16 DIAGNOSIS — R0989 Other specified symptoms and signs involving the circulatory and respiratory systems: Secondary | ICD-10-CM

## 2012-10-16 DIAGNOSIS — R0609 Other forms of dyspnea: Secondary | ICD-10-CM

## 2012-10-16 NOTE — Procedures (Signed)
NAME:  Pamela Underwood, Pamela Underwood             ACCOUNT NO.:  [id number]  MEDICAL RECORD NO.:  [id number]          PATIENT TYPE:  OUT  LOCATION:  SLEEP CENTER                 FACILITY:  [facility]  PHYSICIAN:  Clinton D. Maple Hudson, MD, FCCP, FACPDATE OF BIRTH:  Sep 25, 1955  DATE OF STUDY:  10/08/2012                           NOCTURNAL POLYSOMNOGRAM  REFERRING PHYSICIAN:  Corky Crafts, MD  INDICATION FOR STUDY:  Hypersomnia with sleep apnea.  EPWORTH SLEEPINESS SCORE:  5/24.  BMI 28.1.  Weight 185 pounds, height 68 inches, neck 13 inches.  MEDICATIONS:  Home medications are charted and reviewed.  SLEEP ARCHITECTURE:  Total sleep time 348.5 minutes with sleep efficiency 91%.  Stage I was 7.5%.  Stage II 67.4%.  Stage III absent. REM 25.1% of total sleep time.  Sleep latency 13.5 minutes, REM latency 88.5 minutes, awake after sleep onset 22 minutes.  Arousal index 14.5. Bedtime medication:  Ambien.  RESPIRATORY DATA:  Apnea-hypopnea index (AHI) 20.3 per hour.  A total of 118 events were scored, including 28 obstructive apneas and 90 hypopneas.  Events were associated with supine sleep position and REM. REM AHI 51.4 per hour.  This study was ordered as a diagnostic NPSG protocol and CPAP titration was not done.  OXYGEN DATA:  Very loud snoring with oxygen desaturation to a nadir of 81% and mean oxygen saturation through the study of 94.4% on room air.  CARDIAC DATA:  Normal sinus rhythm.  MOVEMENT-PARASOMNIA:  No significant movement disturbance.  Bathroom x1.  IMPRESSIONS-RECOMMENDATIONS: 1. Moderate obstructive sleep apnea/hypopnea syndrome, AHI 20.3 per     hour with supine events.  Very loud snoring with oxygen     desaturation to a nadir of 81% and mean oxygen saturation through     the study of 94.4% on room air. 2. This study was ordered as a diagnostic NPSG protocol without CPAP.     Consider return for dedicated CPAP titration study or evaluate for     alternative management as  appropriate.    Clinton D. Maple Hudson, MD, [facility], FACP Diplomate, American Board of Sleep Medicine   CDY/MEDQ  D:  10/16/2012 10:35:13  T:  10/16/2012 11:06:59  Job:  [id number]

## 2012-11-30 ENCOUNTER — Ambulatory Visit (HOSPITAL_BASED_OUTPATIENT_CLINIC_OR_DEPARTMENT_OTHER): Payer: 59

## 2012-12-23 ENCOUNTER — Ambulatory Visit (HOSPITAL_BASED_OUTPATIENT_CLINIC_OR_DEPARTMENT_OTHER): Payer: 59 | Attending: Interventional Cardiology

## 2012-12-23 VITALS — Ht 68.0 in | Wt 185.0 lb

## 2012-12-23 DIAGNOSIS — G4733 Obstructive sleep apnea (adult) (pediatric): Secondary | ICD-10-CM

## 2012-12-23 DIAGNOSIS — G4710 Hypersomnia, unspecified: Secondary | ICD-10-CM | POA: Insufficient documentation

## 2012-12-25 DIAGNOSIS — G4730 Sleep apnea, unspecified: Secondary | ICD-10-CM

## 2012-12-25 DIAGNOSIS — G4710 Hypersomnia, unspecified: Secondary | ICD-10-CM

## 2012-12-25 NOTE — Procedures (Signed)
NAME:  Pamela Underwood, Pamela Underwood             ACCOUNT NO.:  [id number]  MEDICAL RECORD NO.:  [id number]          PATIENT TYPE:  OUT  LOCATION:  SLEEP CENTER                 FACILITY:  [facility]  PHYSICIAN:  Melvena Vink D. Maple Hudson, MD, FCCP, FACPDATE OF BIRTH:  01[birth year]07-1956  DATE OF STUDY:  12/23/2012                           NOCTURNAL POLYSOMNOGRAM  REFERRING PHYSICIAN:  Corky Crafts, MD  INDICATION FOR STUDY:  Hypersomnia with sleep apnea.  EPWORTH SLEEPINESS SCORE:  5/24.  BMI 28.1, weight 185 pounds, height 68 inches, neck 14 inches.  MEDICATIONS:  Home medications charted for review.  The baseline diagnostic study on Oct 08, 2012 recorded AHI 20.3 per hour.  Body weight was 185 pounds.  CPAP titration is requested.  SLEEP ARCHITECTURE:  Total sleep time 308.5 minutes with sleep efficiency 74.6%.  Stage I was 3.7%, stage II was 61.3%, stage III was 0.2%, REM was 34.8% of total sleep time.  Sleep latency 14.5 minutes, REM latency 148.5 minutes.  Arousal index 0.8.  Bedtime medication: Ambien.  RESPIRATORY DATA:  CPAP titration protocol.  CPAP was titrated to 10 CWP with a residual of AHI 5.4 per hour reflecting a couple of central apneas which would not be expected to respond to CPAP.  She wore a medium Eson nasal mask with heated humidifier.  OXYGEN DATA:  Snoring was prevented by CPAP and mean oxygen saturation held 95.6% on room air.  CARDIAC DATA:  Normal sinus rhythm.  MOVEMENT/PARASOMNIA:  No significant movement disturbance.  Bathroom x1.  IMPRESSION/RECOMMENDATION: 1. Successful CPAP titration to 10 CWP, AHI 5.4 per hour reflecting a     few residual central apneas.  She wore a medium Eson nasal mask     with heated humidifier.  Snoring was prevented and mean oxygen     saturation held 95.6% on room air. 2. A baseline diagnostic study on Oct 08, 2012 had recorded AHI 20.3     per hour.  Body weight was 185 pounds for that study.     [name], MD, [facility],  FACP Diplomate, American Board of Sleep Medicine    CDY/MEDQ  D:  12/25/2012 12:06:54  T:  12/25/2012 13:30:27  Job:  [id number]

## 2013-02-15 ENCOUNTER — Other Ambulatory Visit (HOSPITAL_COMMUNITY): Payer: Self-pay | Admitting: Orthopedic Surgery

## 2013-02-15 DIAGNOSIS — R609 Edema, unspecified: Secondary | ICD-10-CM

## 2013-02-15 DIAGNOSIS — M25562 Pain in left knee: Secondary | ICD-10-CM

## 2013-02-18 ENCOUNTER — Ambulatory Visit (HOSPITAL_COMMUNITY)
Admission: RE | Admit: 2013-02-18 | Discharge: 2013-02-18 | Disposition: A | Discharge status: Home / Self Care | Payer: 59 | Source: Ambulatory Visit | Attending: Orthopedic Surgery | Admitting: Orthopedic Surgery

## 2013-02-18 DIAGNOSIS — M25569 Pain in unspecified knee: Secondary | ICD-10-CM | POA: Insufficient documentation

## 2013-02-18 DIAGNOSIS — M25469 Effusion, unspecified knee: Secondary | ICD-10-CM | POA: Insufficient documentation

## 2013-02-18 DIAGNOSIS — M2240 Chondromalacia patellae, unspecified knee: Secondary | ICD-10-CM | POA: Insufficient documentation

## 2013-02-18 DIAGNOSIS — R609 Edema, unspecified: Secondary | ICD-10-CM

## 2013-02-18 DIAGNOSIS — M25562 Pain in left knee: Secondary | ICD-10-CM

## 2013-02-18 DIAGNOSIS — M23319 Other meniscus derangements, anterior horn of medial meniscus, unspecified knee: Secondary | ICD-10-CM | POA: Insufficient documentation

## 2013-02-18 DIAGNOSIS — M1710 Unilateral primary osteoarthritis, unspecified knee: Secondary | ICD-10-CM | POA: Insufficient documentation

## 2013-02-18 DIAGNOSIS — M6750 Plica syndrome, unspecified knee: Secondary | ICD-10-CM | POA: Insufficient documentation

## 2013-02-19 ENCOUNTER — Encounter: Payer: Self-pay | Admitting: Cardiology

## 2013-03-28 ENCOUNTER — Encounter (HOSPITAL_BASED_OUTPATIENT_CLINIC_OR_DEPARTMENT_OTHER): Payer: Self-pay | Admitting: *Deleted

## 2013-03-28 ENCOUNTER — Other Ambulatory Visit: Payer: Self-pay | Admitting: Orthopedic Surgery

## 2013-03-28 NOTE — Progress Notes (Signed)
To come in for bmet-will do ekg if needed-

## 2013-03-29 NOTE — H&P (Signed)
Pamela Underwood/[facility] [address]   [address] [place], [place] [address] [phone] A Division of [facility]  Pamela Underwood, M.D.   Pamela Underwood, M.D.   Pamela Underwood, M.D.   Pamela Underwood, M.D.   Pamela Underwood, M.D Pamela Underwood, M.D.  Pamela Underwood, M.D.   Pamela Underwood, M.D.   Pamela Underwood, M.D. Pamela Underwood. Pamela Dienes, PA-C            Pamela A. Shepperson, PA-C Pamela Pescadero, PA-C [place], [place]   RE: Pamela Underwood, [place]   [id number]      DOB: March 18, 1956 PROGRESS NOTE: 10-19-12 Pamela Underwood comes in for her left knee. Symptoms 2-3 weeks. All of this retropatellar. She started working out with a Systems analyst doing a fair amount of squats and irritated the knee. Rest pain, night pain and swelling.  Cannot do squats or stairs. No other discrete injury. She had mild chondromalacia in the past but nothing marked. No symptoms on the right. She feels like she is de-conditioned from her compression fractures lumbar spine from last year and is making an effort to get back in shape.  Remaining history general exam is outlined included in the chart.  EXAMINATION: 57 year old female, 5'8" 182 pounds. Normal gait and stance. 1+ effusion on the left. Mildly increased q-angle lateral tracking some tethering both sides left greater than right. Not a lot of crepitus. Full motion both knees. No meniscal signs stable ligaments neurovascularly intact distally. Normal femoral version.  X-RAYS: 4 view x-rays left knee really shows good alignment overall structures. No significant degenerative change.  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 This is fairly dramatic.   DISPOSITION: We discussed intraarticular injection to settle down synovitis. Modify exercise program with less squats and lunges. Advance as tolerated. She'll let me know if things resolve or not. She understands and  agrees.  PROCEDURE: The patient's clinical condition is marked by substantial pain and/or significant functional disability. Other conservative therapy has not provided relief, is contraindicated, or not appropriate. There is a reasonable likelihood that injection will significantly improve the patient's pain and/or functional disability.   After routine sterile prep of the knee with use of ethyl chloride and 1% plain Xylocaine the knee is injected with 2:6 Depo-Medrol/Marcaine, accomplished atraumatically.  Patient tolerated the procedure well.   Pamela Underwood, M.D.  Electronically verified by Pamela Underwood, M.D. DFM:kah D 10-19-12  Pamela Underwood/[facility] [address]   [address] [place], [place] [address] [phone] A Division of [facility]  Pamela Underwood, M.D.   Pamela Underwood, M.D.   Pamela Underwood, M.D.   Pamela Underwood, M.D.   Pamela Underwood, M.D Pamela Underwood. Pamela Underwood, M.D.  Pamela Underwood, M.D.  Pamela Underwood, M.D.  Pamela Underwood, M.D.   Pamela Underwood, M.D. Pamela Underwood. Pamela Rough, MD.  Pamela A. Shepperson, PA-C  Pamela Spickard, PA-C [place], [place]  RE: Pamela Underwood, Pamela Underwood   [id number]      DOB: 1956-01-13 PHONE NOTE: 02-23-13 I went over [name]'s MRI and spoke with her. Severe degeneration anterior third medial meniscus with tearing and a cyst. This is where all her symptoms are. There are diffuse changes in her knee mild to moderate especially patellofemoral joint. Medial plica as well. She is getting worse rather than better. We discussed definitive treatment with exam under anesthesia arthroscopy generalized debridement meniscectomy excision of her  plica and chondroplasty. Discussed risks benefits and possible complications in detail. Out of work for 3-6 weeks depending on recovery and rehab. We have exhausted other conservative treatment. Paperwork complete all questions answered. See her at the time of  surgery.  Pamela Underwood, M.D.  Electronically verified by Pamela Underwood, M.D. DFM:kah D 02-23-13 T 02-24-13 T 10-20-12

## 2013-03-30 ENCOUNTER — Encounter (HOSPITAL_BASED_OUTPATIENT_CLINIC_OR_DEPARTMENT_OTHER)
Admission: RE | Admit: 2013-03-30 | Discharge: 2013-03-30 | Disposition: A | Discharge status: Home / Self Care | Payer: 59 | Source: Ambulatory Visit | Attending: Orthopedic Surgery | Admitting: Orthopedic Surgery

## 2013-03-30 LAB — BASIC METABOLIC PANEL WITH GFR
BUN: 19 mg/dL (ref 6–23)
CO2: 22 meq/L (ref 19–32)
Calcium: 9.4 mg/dL (ref 8.4–10.5)
Chloride: 102 meq/L (ref 96–112)
Creatinine, Ser: 0.74 mg/dL (ref 0.50–1.10)
GFR calc Af Amer: >90 mL/min
GFR calc non Af Amer: >90 mL/min
Glucose, Bld: 118 mg/dL — ABNORMAL HIGH (ref 70–99)
Potassium: 4.2 meq/L (ref 3.5–5.1)
Sodium: 138 meq/L (ref 135–145)

## 2013-03-31 ENCOUNTER — Encounter (HOSPITAL_BASED_OUTPATIENT_CLINIC_OR_DEPARTMENT_OTHER)
Admission: RE | Disposition: A | Discharge status: Home / Self Care | Payer: Self-pay | Source: Ambulatory Visit | Attending: Orthopedic Surgery

## 2013-03-31 ENCOUNTER — Encounter (HOSPITAL_BASED_OUTPATIENT_CLINIC_OR_DEPARTMENT_OTHER): Payer: Self-pay | Admitting: *Deleted

## 2013-03-31 ENCOUNTER — Ambulatory Visit (HOSPITAL_BASED_OUTPATIENT_CLINIC_OR_DEPARTMENT_OTHER)
Admission: RE | Admit: 2013-03-31 | Discharge: 2013-03-31 | Disposition: A | Discharge status: Home / Self Care | Payer: 59 | Source: Ambulatory Visit | Attending: Orthopedic Surgery | Admitting: Orthopedic Surgery

## 2013-03-31 ENCOUNTER — Encounter (HOSPITAL_BASED_OUTPATIENT_CLINIC_OR_DEPARTMENT_OTHER): Payer: 59 | Admitting: Anesthesiology

## 2013-03-31 ENCOUNTER — Ambulatory Visit (HOSPITAL_BASED_OUTPATIENT_CLINIC_OR_DEPARTMENT_OTHER): Payer: 59 | Admitting: Anesthesiology

## 2013-03-31 DIAGNOSIS — I472 Ventricular tachycardia, unspecified: Secondary | ICD-10-CM | POA: Insufficient documentation

## 2013-03-31 DIAGNOSIS — M23329 Other meniscus derangements, posterior horn of medial meniscus, unspecified knee: Secondary | ICD-10-CM | POA: Insufficient documentation

## 2013-03-31 DIAGNOSIS — M6750 Plica syndrome, unspecified knee: Secondary | ICD-10-CM | POA: Insufficient documentation

## 2013-03-31 DIAGNOSIS — I4729 Other ventricular tachycardia: Secondary | ICD-10-CM | POA: Insufficient documentation

## 2013-03-31 DIAGNOSIS — M2240 Chondromalacia patellae, unspecified knee: Secondary | ICD-10-CM | POA: Insufficient documentation

## 2013-03-31 DIAGNOSIS — M23302 Other meniscus derangements, unspecified lateral meniscus, unspecified knee: Secondary | ICD-10-CM | POA: Insufficient documentation

## 2013-03-31 DIAGNOSIS — M25562 Pain in left knee: Secondary | ICD-10-CM

## 2013-03-31 HISTORY — PX: KNEE ARTHROSCOPY WITH MEDIAL MENISECTOMY: SHX5651

## 2013-03-31 HISTORY — DX: Sleep apnea, unspecified: G47.30

## 2013-03-31 SURGERY — ARTHROSCOPY, KNEE, WITH MEDIAL MENISCECTOMY
Anesthesia: General | Site: Knee | Laterality: Left | Wound class: Clean

## 2013-03-31 MED ORDER — HYDROMORPHONE HCL PF 1 MG/ML IJ SOLN
INTRAMUSCULAR | Status: AC
  Filled 2013-03-31: qty 1

## 2013-03-31 MED ORDER — CEFAZOLIN SODIUM-DEXTROSE 2-3 GM-% IV SOLR
2.0000 g | INTRAVENOUS | Status: AC
  Administered 2013-03-31: 2 g via INTRAVENOUS

## 2013-03-31 MED ORDER — FENTANYL CITRATE 0.05 MG/ML IJ SOLN
INTRAMUSCULAR | Status: DC | PRN
  Administered 2013-03-31 (×2): 50 ug via INTRAVENOUS

## 2013-03-31 MED ORDER — METHYLPREDNISOLONE ACETATE 80 MG/ML IJ SUSP
INTRAMUSCULAR | Status: DC | PRN
  Administered 2013-03-31: 08:00:00 via INTRA_ARTICULAR

## 2013-03-31 MED ORDER — ACETAMINOPHEN 500 MG PO TABS
ORAL_TABLET | ORAL | Status: AC
  Filled 2013-03-31: qty 2

## 2013-03-31 MED ORDER — ONDANSETRON HCL 4 MG/2ML IJ SOLN
INTRAMUSCULAR | Status: DC | PRN
  Administered 2013-03-31: 4 mg via INTRAVENOUS

## 2013-03-31 MED ORDER — ACETAMINOPHEN 500 MG PO TABS
1000.0000 mg | ORAL_TABLET | Freq: Once | ORAL | Status: AC
  Administered 2013-03-31: 1000 mg via ORAL

## 2013-03-31 MED ORDER — CHLORHEXIDINE GLUCONATE 4 % EX LIQD: 60.0000 mL | Freq: Once | CUTANEOUS | Status: DC

## 2013-03-31 MED ORDER — MIDAZOLAM HCL 2 MG/2ML IJ SOLN
INTRAMUSCULAR | Status: AC
  Filled 2013-03-31: qty 2

## 2013-03-31 MED ORDER — OXYCODONE HCL 5 MG PO TABS
ORAL_TABLET | ORAL | Status: AC
  Filled 2013-03-31: qty 1

## 2013-03-31 MED ORDER — PROPOFOL 10 MG/ML IV BOLUS
INTRAVENOUS | Status: DC | PRN
  Administered 2013-03-31: 200 mg via INTRAVENOUS

## 2013-03-31 MED ORDER — LIDOCAINE HCL (CARDIAC) 20 MG/ML IV SOLN
INTRAVENOUS | Status: DC | PRN
  Administered 2013-03-31: 50 mg via INTRAVENOUS

## 2013-03-31 MED ORDER — DEXTROSE-NACL 5-0.45 % IV SOLN: INTRAVENOUS | Status: DC

## 2013-03-31 MED ORDER — ONDANSETRON HCL 4 MG/2ML IJ SOLN: 4.0000 mg | Freq: Once | INTRAMUSCULAR | Status: DC | PRN

## 2013-03-31 MED ORDER — LACTATED RINGERS IV SOLN
INTRAVENOUS | Status: DC
Start: 2013-03-31 — End: 2013-03-31
  Administered 2013-03-31: 07:00:00 via INTRAVENOUS

## 2013-03-31 MED ORDER — DEXAMETHASONE SODIUM PHOSPHATE 4 MG/ML IJ SOLN
INTRAMUSCULAR | Status: DC | PRN
  Administered 2013-03-31: 10 mg via INTRAVENOUS

## 2013-03-31 MED ORDER — FENTANYL CITRATE 0.05 MG/ML IJ SOLN
50.0000 ug | Freq: Once | INTRAMUSCULAR | Status: AC
  Administered 2013-03-31: 100 ug via INTRAVENOUS

## 2013-03-31 MED ORDER — OXYCODONE-ACETAMINOPHEN 5-325 MG PO TABS: 1.0000 | ORAL_TABLET | ORAL | Status: DC | PRN

## 2013-03-31 MED ORDER — BUPIVACAINE HCL (PF) 0.5 % IJ SOLN
INTRAMUSCULAR | Status: AC
  Filled 2013-03-31: qty 30

## 2013-03-31 MED ORDER — OXYCODONE HCL 5 MG PO TABS
5.0000 mg | ORAL_TABLET | Freq: Once | ORAL | Status: AC | PRN
  Administered 2013-03-31: 5 mg via ORAL

## 2013-03-31 MED ORDER — BUPIVACAINE HCL (PF) 0.25 % IJ SOLN
INTRAMUSCULAR | Status: DC | PRN
  Administered 2013-03-31: 5 mL via INTRA_ARTICULAR

## 2013-03-31 MED ORDER — METHYLPREDNISOLONE ACETATE 80 MG/ML IJ SUSP
INTRAMUSCULAR | Status: AC
  Filled 2013-03-31: qty 1

## 2013-03-31 MED ORDER — BUPIVACAINE HCL (PF) 0.25 % IJ SOLN
INTRAMUSCULAR | Status: AC
  Filled 2013-03-31: qty 30

## 2013-03-31 MED ORDER — OXYCODONE HCL 5 MG/5ML PO SOLN: 5.0000 mg | Freq: Once | ORAL | Status: AC | PRN

## 2013-03-31 MED ORDER — SODIUM CHLORIDE 0.9 % IR SOLN
Status: DC | PRN
  Administered 2013-03-31: 4500 mL

## 2013-03-31 MED ORDER — BUPIVACAINE-EPINEPHRINE PF 0.5-1:200000 % IJ SOLN
INTRAMUSCULAR | Status: DC | PRN
  Administered 2013-03-31: 25 mL

## 2013-03-31 MED ORDER — MIDAZOLAM HCL 5 MG/5ML IJ SOLN
INTRAMUSCULAR | Status: DC | PRN
  Administered 2013-03-31: 1 mg via INTRAVENOUS

## 2013-03-31 MED ORDER — FENTANYL CITRATE 0.05 MG/ML IJ SOLN
INTRAMUSCULAR | Status: AC
  Filled 2013-03-31: qty 4

## 2013-03-31 MED ORDER — FENTANYL CITRATE 0.05 MG/ML IJ SOLN
INTRAMUSCULAR | Status: AC
  Filled 2013-03-31: qty 2

## 2013-03-31 MED ORDER — MIDAZOLAM HCL 2 MG/2ML IJ SOLN
1.0000 mg | INTRAMUSCULAR | Status: DC | PRN
  Administered 2013-03-31: 2 mg via INTRAVENOUS

## 2013-03-31 MED ORDER — KETOROLAC TROMETHAMINE 30 MG/ML IJ SOLN
INTRAMUSCULAR | Status: DC | PRN
  Administered 2013-03-31: 30 mg via INTRAVENOUS

## 2013-03-31 MED ORDER — CEFAZOLIN SODIUM 1-5 GM-% IV SOLN
INTRAVENOUS | Status: AC
  Filled 2013-03-31: qty 100

## 2013-03-31 MED ORDER — HYDROMORPHONE HCL PF 1 MG/ML IJ SOLN
0.2500 mg | INTRAMUSCULAR | Status: DC | PRN
  Administered 2013-03-31 (×4): 0.5 mg via INTRAVENOUS

## 2013-03-31 SURGICAL SUPPLY — 40 items
BANDAGE ELASTIC 6 VELCRO ST LF (GAUZE/BANDAGES/DRESSINGS) ×2 IMPLANT
BLADE CUDA 5.5 (BLADE) IMPLANT
BLADE CUDA GRT WHITE 3.5 (BLADE) IMPLANT
BLADE CUTTER GATOR 3.5 (BLADE) ×2 IMPLANT
BLADE CUTTER MENIS 5.5 (BLADE) IMPLANT
BLADE GREAT WHITE 4.2 (BLADE) ×2 IMPLANT
BUR OVAL 4.0 (BURR) IMPLANT
CANISTER SUCT 3000ML (MISCELLANEOUS) IMPLANT
CANISTER SUCT LVC 12 LTR MEDI- (MISCELLANEOUS) ×2 IMPLANT
CUTTER MENISCUS  4.2MM (BLADE)
CUTTER MENISCUS 4.2MM (BLADE) IMPLANT
DRAPE ARTHROSCOPY W/POUCH 90 (DRAPES) ×2 IMPLANT
DURAPREP 26ML APPLICATOR (WOUND CARE) ×2 IMPLANT
ELECT MENISCUS 165MM 90D (ELECTRODE) IMPLANT
ELECT REM PT RETURN 9FT ADLT (ELECTROSURGICAL) ×2
ELECTRODE REM PT RTRN 9FT ADLT (ELECTROSURGICAL) ×1 IMPLANT
GAUZE XEROFORM 1X8 LF (GAUZE/BANDAGES/DRESSINGS) ×2 IMPLANT
GLOVE BIO SURGEON STRL SZ 6.5 (GLOVE) ×2 IMPLANT
GLOVE BIO SURGEON STRL SZ8 (GLOVE) ×2 IMPLANT
GLOVE BIOGEL PI IND STRL 7.0 (GLOVE) ×2 IMPLANT
GLOVE BIOGEL PI IND STRL 8.5 (GLOVE) ×1 IMPLANT
GLOVE BIOGEL PI INDICATOR 7.0 (GLOVE) ×2
GLOVE BIOGEL PI INDICATOR 8.5 (GLOVE) ×1
GLOVE ORTHO TXT STRL SZ7.5 (GLOVE) ×2 IMPLANT
GOWN PREVENTION PLUS XLARGE (GOWN DISPOSABLE) ×2 IMPLANT
GOWN STRL REIN 2XL XLG LVL4 (GOWN DISPOSABLE) ×2 IMPLANT
HOLDER KNEE FOAM BLUE (MISCELLANEOUS) ×2 IMPLANT
IV NS IRRIG 3000ML ARTHROMATIC (IV SOLUTION) ×4 IMPLANT
KNEE WRAP E Z 3 GEL PACK (MISCELLANEOUS) ×2 IMPLANT
NEEDLE HYPO 22GX1.5 SAFETY (NEEDLE) ×2 IMPLANT
PACK ARTHROSCOPY DSU (CUSTOM PROCEDURE TRAY) ×2 IMPLANT
PACK BASIN DAY SURGERY FS (CUSTOM PROCEDURE TRAY) ×2 IMPLANT
PENCIL BUTTON HOLSTER BLD 10FT (ELECTRODE) IMPLANT
SET ARTHROSCOPY TUBING (MISCELLANEOUS) ×1
SET ARTHROSCOPY TUBING LN (MISCELLANEOUS) ×1 IMPLANT
SPONGE GAUZE 4X4 12PLY (GAUZE/BANDAGES/DRESSINGS) ×2 IMPLANT
SUT ETHILON 3 0 PS 1 (SUTURE) ×2 IMPLANT
SUT VIC AB 3-0 FS2 27 (SUTURE) IMPLANT
TOWEL OR 17X24 6PK STRL BLUE (TOWEL DISPOSABLE) ×2 IMPLANT
WATER STERILE IRR 1000ML POUR (IV SOLUTION) ×2 IMPLANT

## 2013-03-31 NOTE — Anesthesia Postprocedure Evaluation (Signed)
  Anesthesia Post-op Note  Patient: Pamela Underwood  Procedure(s) Performed: Procedure(s): LEFT KNEE ARTHROSCOPY WITH PARTIAL MEDIAL MENISECTOMY, CHONDROPLASTY OF PATELLA  FEMORAL JOINT, EXCISION OF MEDIAL PLICA, PARTIAL LATERAL MENISECTOMY (Left)  Patient Location: PACU  Anesthesia Type:General  Level of Consciousness: awake, alert  and oriented  Airway and Oxygen Therapy: Patient Spontanous Breathing and Patient connected to nasal cannula oxygen  Post-op Pain: mild  Post-op Assessment: Post-op Vital signs reviewed  Post-op Vital Signs: Reviewed  Complications: No apparent anesthesia complications

## 2013-03-31 NOTE — Anesthesia Preprocedure Evaluation (Signed)
Anesthesia Evaluation  Patient identified by MRN, date of birth, ID band Patient awake    Reviewed: Allergy & Precautions, H&P , NPO status , Patient's Chart, lab work & pertinent test results  Airway Mallampati: I TM Distance: >3 FB Neck ROM: Full    Dental  (+) Teeth Intact and Dental Advisory Given   Pulmonary  breath sounds clear to auscultation        Cardiovascular + dysrhythmias Supra Ventricular Tachycardia Rhythm:Regular Rate:Normal     Neuro/Psych    GI/Hepatic   Endo/Other    Renal/GU      Musculoskeletal   Abdominal   Peds  Hematology   Anesthesia Other Findings   Reproductive/Obstetrics                           Anesthesia Physical Anesthesia Plan  ASA: II  Anesthesia Plan: General   Post-op Pain Management:    Induction: Intravenous  Airway Management Planned: LMA  Additional Equipment:   Intra-op Plan:   Post-operative Plan: Extubation in OR  Informed Consent: I have reviewed the patients History and Physical, chart, labs and discussed the procedure including the risks, benefits and alternatives for the proposed anesthesia with the patient or authorized representative who has indicated his/her understanding and acceptance.   Dental advisory given  Plan Discussed with: CRNA, Anesthesiologist and Surgeon  Anesthesia Plan Comments:         Anesthesia Quick Evaluation

## 2013-03-31 NOTE — Interval H&P Note (Signed)
History and Physical Interval Note:  03/31/2013 7:24 AM  Pamela Underwood  has presented today for surgery, with the diagnosis of 836.0 LEFT KNEE MEDIAL MENISCAL TEAR  The various methods of treatment have been discussed with the patient and family. After consideration of risks, benefits and other options for treatment, the patient has consented to  Procedure(s): LEFT KNEE ARTHROSCOPY WITH MEDIAL MENISECTOMY (Left) as a surgical intervention .  The patient's history has been reviewed, patient examined, no change in status, stable for surgery.  I have reviewed the patient's chart and labs.  Questions were answered to the patient's satisfaction.     [name]

## 2013-03-31 NOTE — Interval H&P Note (Signed)
History and Physical Interval Note:  03/31/2013 7:25 AM  Pamela Underwood  has presented today for surgery, with the diagnosis of 836.0 LEFT KNEE MEDIAL MENISCAL TEAR  The various methods of treatment have been discussed with the patient and family. After consideration of risks, benefits and other options for treatment, the patient has consented to  Procedure(s): LEFT KNEE ARTHROSCOPY WITH MEDIAL MENISECTOMY (Left) as a surgical intervention .  The patient's history has been reviewed, patient examined, no change in status, stable for surgery.  I have reviewed the patient's chart and labs.  Questions were answered to the patient's satisfaction.     Anyelina Claycomb F

## 2013-03-31 NOTE — Progress Notes (Signed)
Assisted Dr. Crews with left, knee block. Side rails up, monitors on throughout procedure. See vital signs in flow sheet. Tolerated Procedure well. 

## 2013-03-31 NOTE — Anesthesia Procedure Notes (Signed)
Procedure Name: LMA Insertion Performed by: Miasha Emmons W Pre-anesthesia Checklist: Patient identified, Timeout performed, Emergency Drugs available, Suction available and Patient being monitored Patient Re-evaluated:Patient Re-evaluated prior to inductionOxygen Delivery Method: Circle system utilized Preoxygenation: Pre-oxygenation with 100% oxygen Intubation Type: IV induction Ventilation: Mask ventilation without difficulty LMA: LMA inserted LMA Size: 4.0 Number of attempts: 1 Tube secured with: Tape Dental Injury: Teeth and Oropharynx as per pre-operative assessment      

## 2013-03-31 NOTE — Transfer of Care (Signed)
Immediate Anesthesia Transfer of Care Note  Patient: Pamela Underwood  Procedure(s) Performed: Procedure(s): LEFT KNEE ARTHROSCOPY WITH PARTIAL MEDIAL MENISECTOMY, CHONDROPLASTY OF PATELLA  FEMORAL JOINT, EXCISION OF MEDIAL PLICA, PARTIAL LATERAL MENISECTOMY (Left)  Patient Location: PACU  Anesthesia Type:General  Level of Consciousness: awake, alert  and oriented  Airway & Oxygen Therapy: Patient Spontanous Breathing and Patient connected to face mask oxygen  Post-op Assessment: Report given to PACU RN and Post -op Vital signs reviewed and stable  Post vital signs: Reviewed and stable  Complications: No apparent anesthesia complications

## 2013-04-01 ENCOUNTER — Encounter (HOSPITAL_BASED_OUTPATIENT_CLINIC_OR_DEPARTMENT_OTHER): Payer: Self-pay | Admitting: Orthopedic Surgery

## 2013-04-01 ENCOUNTER — Encounter: Payer: Self-pay | Admitting: Family Medicine

## 2013-04-01 NOTE — Op Note (Signed)
NAME:  Pamela Underwood, Pamela Underwood NO.:  [id number]  MEDICAL RECORD NO.:  [id number]  LOCATION:                               FACILITY:  [facility]  PHYSICIAN:  Loreta Ave, M.D. DATE OF BIRTH:  06-13-1955  DATE OF PROCEDURE:  03/31/2013 DATE OF DISCHARGE:  03/31/2013                              OPERATIVE REPORT   PREOPERATIVE DIAGNOSIS:  Left knee medial meniscus tear with tricompartmental changes.  POSTOPERATIVE DIAGNOSES:  Left knee medial meniscus tear with tricompartmental changes with complex tearing posterior medial meniscus, as well as circumferential complex tearing lateral meniscus.  Grade 3 and 4 changes in entire patellofemoral joint, as well as weightbearing dome, medial femoral condyle and lateral tibial plateau with chondral loose bodies.  Medial plica.  PROCEDURE:  Left knee exam under anesthesia, arthroscopy. Tricompartmental chondroplasty.  Partial medial and lateral meniscectomies.  Excision of medial plica.  SURGEON:  Loreta Ave, MD.  ASSISTANT:  Domingo Cocking, PA-C.  ANESTHESIA:  General.  BLOOD LOSS:  Minimal.  SPECIMENS:  None.  CULTURES:  None.  COMPLICATIONS:  None.  DRESSINGS:  Soft compressive.  TOURNIQUET:  Not employed.  DESCRIPTION OF PROCEDURE:  The patient was brought to the operating room, and placed on the operating table in supine position.  After adequate anesthesia had been obtained, leg holder applied.  Leg prepped and draped in usual sterile fashion.  Two portals, one each medial and lateral parapatellar.  Arthroscope was introduced.  Knee distended and inspected.  Patella tracking relatively good with grade 3 and 4 changes over the entire patellofemoral joint with the entire medial trochlea down the bone.  Fibrotic medial plica excised.  Chondroplasty to a stable surface.  Chondral loose bodies removed.  ACL intact.  Medially grade 3 changes weightbearing dome on the condyle debrided.  Complex tearing  posterior medial meniscus saucerized out to a stable rim, tapered in smoothly.  Some roughening in the frontal meniscus debrided. Laterally, there were flap tears throughout the meniscus debrided all the way around.  The condyle looked good but there was some focal grade 3 and 4 changes in the middle of the plateau with loose chondral fragments.  Chondroplasty to a stable surface.  Given the graphic nature of degenerative changes, microfracture not indicated.  The entire knee was examined to be sure all loose fragments removed.  Instruments were fully removed.  Knee injected with Depo- Medrol and Marcaine.  Portals were closed with nylon.  Sterile compressive dressing applied.  Anesthesia reversed.  Brought to the recovery room.  Tolerated the surgery well.  No complications.     Loreta Ave, M.D.     DFM/MEDQ  D:  03/31/2013  T:  04/01/2013  Job:  [id number]

## 2013-04-04 ENCOUNTER — Ambulatory Visit: Payer: 59 | Admitting: Cardiology

## 2013-04-25 ENCOUNTER — Ambulatory Visit: Payer: 59 | Admitting: Cardiology

## 2013-05-09 ENCOUNTER — Encounter: Payer: Self-pay | Admitting: General Surgery

## 2013-05-09 ENCOUNTER — Ambulatory Visit (INDEPENDENT_AMBULATORY_CARE_PROVIDER_SITE_OTHER): Payer: 59 | Admitting: Cardiology

## 2013-05-09 VITALS — BP 150/90 | HR 87 | Ht 68.0 in | Wt 189.0 lb

## 2013-05-09 DIAGNOSIS — G4733 Obstructive sleep apnea (adult) (pediatric): Secondary | ICD-10-CM

## 2013-05-09 DIAGNOSIS — I471 Supraventricular tachycardia: Secondary | ICD-10-CM | POA: Insufficient documentation

## 2013-05-09 DIAGNOSIS — R5383 Other fatigue: Secondary | ICD-10-CM | POA: Insufficient documentation

## 2013-05-09 DIAGNOSIS — E669 Obesity, unspecified: Secondary | ICD-10-CM

## 2013-05-09 DIAGNOSIS — Z13220 Encounter for screening for lipoid disorders: Secondary | ICD-10-CM | POA: Insufficient documentation

## 2013-05-09 NOTE — Patient Instructions (Signed)
Your physician recommends that you continue on your current medications as directed. Please refer to the Current Medication list given to you today.  We will call you once we have the results of your download from [facility]  Your physician wants you to follow-up in: 6 Months with Dr. Sherlyn Lick will receive a reminder letter in the mail two months in advance. If you don't receive a letter, please call our office to schedule the follow-up appointment.

## 2013-05-09 NOTE — Progress Notes (Signed)
[address] [place], [place]  [address] Phone: [phone] Fax:  [phone]  Date:  05/09/2013   ID:  Pamela Underwood, DOB 03-Jun-1955, MRN [id number]  PCP:  Leo Grosser, MD  Cardiologist:  Everette Rank, MD  Sleep Medicine:  Armanda Magic, MD   History of Present Illness: Pamela Underwood is a 57 y.o. female with a history of SVT who recently underwent PSG showing moderate OSA and underwent CPAP titration to 10cm H2O and now presents today for followup.  She states that she told Dr. Eldridge Dace that she was feeling fatigued with snoring.  She uses a Resmed Airfit Nasal mask.  She feels rested when she gets up in the am.  She has no daytime sleepiness.  She just recently had arthroscopic knee surgery which she is recovering from.  Wt Readings from Last 3 Encounters:  05/09/13 189 lb (85.73 kg)  03/31/13 186 lb 12.8 oz (84.732 kg)  03/31/13 186 lb 12.8 oz (84.732 kg)     Past Medical History  Diagnosis Date  . Wears glasses   . Sleep apnea     has a cpap-mild-moderate  . Atrial tachycardia   . Mechanical low back pain   . Chronic anxiety   . Obesity (BMI 30-39.9)     Current Outpatient Prescriptions  Medication Sig Dispense Refill  . ALPRAZolam (XANAX) 0.5 MG tablet Take 0.5 mg by mouth at bedtime as needed. For stress/anxiety      . aspirin EC 81 MG tablet Take 81 mg by mouth daily.      Marland Kitchen diltiazem (CARDIZEM LA) 120 MG 24 hr tablet Take 120 mg by mouth daily.      . propafenone (RYTHMOL SR) 225 MG 12 hr capsule Take 225 mg by mouth every 12 (twelve) hours.      Marland Kitchen VITAMIN D, CHOLECALCIFEROL, PO Take 1 capsule by mouth daily.      Marland Kitchen zolpidem (AMBIEN) 10 MG tablet Take 5-10 mg by mouth at bedtime as needed. For sleep        No current facility-administered medications for this visit.    Allergies:    Allergies  Allergen Reactions  . Escitalopram Oxalate Other (See Comments)    Hives, welts, all over skin rash    Social History:  The patient  reports  that she has never smoked. She does not have any smokeless tobacco history on file. She reports that she drinks alcohol. She reports that she does not use illicit drugs.   Family History:  The patient's family history includes Heart disease in her father.   ROS:  Please see the history of present illness.      All other systems reviewed and negative.   PHYSICAL EXAM: VS:  BP 150/90  Pulse 87  Ht 5\' 8"  (1.727 m)  Wt 189 lb (85.73 kg)  BMI 28.74 kg/m2 Well nourished, well developed, in no acute distress HEENT: normal Neck: no JVD Cardiac:  normal S1, S2; RRR; no murmur Lungs:  clear to auscultation bilaterally, no wheezing, rhonchi or rales Abd: soft, nontender, no hepatomegaly Ext: no edema Skin: warm and dry Neuro:  CNs 2-12 intact, no focal abnormalities noted \  ASSESSMENT AND PLAN:  1. OSA on CPAP and doing well 2. Obesity - her exercise is limited right now due to recent knee surgery 3. Elevated BP - at home her BP runs around 130/80'smmHg   Followup with me in 6 months  Signed, Armanda Magic, MD 05/09/2013 2:28 PM

## 2013-05-11 ENCOUNTER — Encounter: Payer: Self-pay | Admitting: General Surgery

## 2013-06-21 ENCOUNTER — Other Ambulatory Visit: Payer: Self-pay | Admitting: Physician Assistant

## 2013-06-21 NOTE — H&P (Signed)
TOTAL KNEE ADMISSION H&P  Patient is being admitted for left total knee arthroplasty.  Subjective:  Chief Complaint:left knee pain.  HPI: Pamela Underwood, 58 y.o. female, has a history of pain and functional disability in the left knee due to arthritis and has failed non-surgical conservative treatments for greater than 12 weeks to includeNSAID's and/or analgesics, corticosteriod injections, viscosupplementation injections, flexibility and strengthening excercises, supervised PT with diminished ADL's post treatment, use of assistive devices and activity modification.  Onset of symptoms was gradual, starting >10 years ago with gradually worsening course since that time. The patient noted prior procedures on the knee to include  arthroscopy and menisectomy on the left knee(s).  Patient currently rates pain in the left knee(s) at 3 out of 10 with activity. Patient has night pain, worsening of pain with activity and weight bearing, pain that interferes with activities of daily living, pain with passive range of motion, crepitus and joint swelling.  Patient has evidence of periarticular osteophytes and joint space narrowing by imaging studies. There is no active infection.  Patient Active Problem List   Diagnosis Date Noted  . Paroxysmal supraventricular tachycardia 05/09/2013  . Fatigue 05/09/2013  . Screening for hypercholesterolemia 05/09/2013  . OSA (obstructive sleep apnea)   . Obesity (BMI 30-39.9)    Past Medical History  Diagnosis Date  . Wears glasses   . Sleep apnea     has a cpap-mild-moderate  . Atrial tachycardia   . Mechanical low back pain   . Chronic anxiety   . Obesity (BMI 30-39.9)     Past Surgical History  Procedure Laterality Date  . Tubal ligation    . Colonoscopy    . Breast surgery  1986    lt br bx-neg  . Knee arthroscopy with medial menisectomy Left 03/31/2013    Procedure: LEFT KNEE ARTHROSCOPY WITH PARTIAL MEDIAL MENISECTOMY, CHONDROPLASTY OF PATELLA  FEMORAL  JOINT, EXCISION OF MEDIAL PLICA, PARTIAL LATERAL MENISECTOMY;  Surgeon: Daniel F Murphy, MD;  Location: [facility];  Service: Orthopedics;  Laterality: Left;     (Not in a hospital admission) Allergies  Allergen Reactions  . Escitalopram Oxalate Other (See Comments)    Hives, welts, all over skin rash    History  Substance Use Topics  . Smoking status: Never Smoker   . Smokeless tobacco: Not on file  . Alcohol Use: Yes     Comment: occ    Family History  Problem Relation Age of Onset  . Heart disease Father      Review of Systems  Constitutional: Negative.   HENT: Negative.   Eyes: Negative.   Respiratory: Negative.   Cardiovascular: Negative.   Gastrointestinal: Negative.   Genitourinary: Negative.   Musculoskeletal: Positive for back pain and joint pain.  Skin: Negative.   Neurological: Negative.   Endo/Heme/Allergies: Negative.   Psychiatric/Behavioral: The patient is nervous/anxious.     Objective:  Physical Exam  Constitutional: She is oriented to person, place, and time. She appears well-developed and well-nourished.  HENT:  Head: Normocephalic and atraumatic.  Eyes: EOM are normal. Pupils are equal, round, and reactive to light.  Neck: Normal range of motion. Neck supple.  Cardiovascular: Normal rate and regular rhythm.  Exam reveals no gallop and no friction rub.   No murmur heard. Respiratory: Effort normal. No respiratory distress. She has no wheezes. She has no rales.  GI: Soft. Bowel sounds are normal.  Musculoskeletal:  1+ effusion on the left. Mildly increased q-angle lateral tracking some tethering.    Normal femoral version. Not a lot of crepitus. Motion from about 0-120 degrees. Ligaments stable.  She is neurovascularly intact distally.  Neurological: She is alert and oriented to person, place, and time.  Skin: Skin is warm and dry.  Psychiatric: She has a normal mood and affect. Her behavior is normal. Judgment and thought content  normal.    Vital signs in last 24 hours: @VSRANGES @  Labs:   Estimated body mass index is 28.74 kg/(m^2) as calculated from the following:   Height as of 05/09/13: 5\' 8"  (1.727 m).   Weight as of 05/09/13: 85.73 kg (189 lb).   Imaging Review Plain radiographs demonstrate moderate degenerative joint disease of the left knee(s). The overall alignment isneutral. The bone quality appears to be fair for age and reported activity level.  Assessment/Plan:  End stage arthritis, left knee   The patient history, physical examination, clinical judgment of the provider and imaging studies are consistent with end stage degenerative joint disease of the left knee(s) and total knee arthroplasty is deemed medically necessary. The treatment options including medical management, injection therapy arthroscopy and arthroplasty were discussed at length. The risks and benefits of total knee arthroplasty were presented and reviewed. The risks due to aseptic loosening, infection, stiffness, patella tracking problems, thromboembolic complications and other imponderables were discussed. The patient acknowledged the explanation, agreed to proceed with the plan and consent was signed. Patient is being admitted for inpatient treatment for surgery, pain control, PT, OT, prophylactic antibiotics, VTE prophylaxis, progressive ambulation and ADL's and discharge planning. The patient is planning to be discharged home with home health services

## 2013-06-22 ENCOUNTER — Encounter (HOSPITAL_COMMUNITY): Payer: Self-pay | Admitting: Pharmacy Technician

## 2013-06-27 ENCOUNTER — Ambulatory Visit (HOSPITAL_COMMUNITY)
Admission: RE | Admit: 2013-06-27 | Discharge: 2013-06-27 | Disposition: A | Discharge status: Home / Self Care | Payer: 59 | Source: Ambulatory Visit | Attending: Physician Assistant | Admitting: Physician Assistant

## 2013-06-27 ENCOUNTER — Encounter (HOSPITAL_COMMUNITY)
Admission: RE | Admit: 2013-06-27 | Discharge: 2013-06-27 | Disposition: A | Discharge status: Home / Self Care | Payer: 59 | Source: Ambulatory Visit | Attending: Orthopedic Surgery | Admitting: Orthopedic Surgery

## 2013-06-27 ENCOUNTER — Encounter (HOSPITAL_COMMUNITY): Payer: Self-pay

## 2013-06-27 DIAGNOSIS — Z01812 Encounter for preprocedural laboratory examination: Secondary | ICD-10-CM | POA: Insufficient documentation

## 2013-06-27 DIAGNOSIS — Z01818 Encounter for other preprocedural examination: Secondary | ICD-10-CM | POA: Insufficient documentation

## 2013-06-27 HISTORY — DX: Dorsalgia, unspecified: M54.9

## 2013-06-27 HISTORY — DX: Noninfective gastroenteritis and colitis, unspecified: K52.9

## 2013-06-27 HISTORY — DX: Unspecified osteoarthritis, unspecified site: M19.90

## 2013-06-27 HISTORY — DX: Insomnia, unspecified: G47.00

## 2013-06-27 LAB — URINE MICROSCOPIC-ADD ON

## 2013-06-27 LAB — CBC WITH DIFFERENTIAL/PLATELET
BASOS ABS: 0 10*3/uL (ref 0.0–0.1)
BASOS PCT: 0 % (ref 0–1)
Eosinophils Absolute: 0.2 10*3/uL (ref 0.0–0.7)
Eosinophils Relative: 2 % (ref 0–5)
HCT: 38.5 % (ref 36.0–46.0)
Hemoglobin: 14 g/dL (ref 12.0–15.0)
Lymphocytes Relative: 31 % (ref 12–46)
Lymphs Abs: 3.3 10*3/uL (ref 0.7–4.0)
MCH: 33.9 pg (ref 26.0–34.0)
MCHC: 36.4 g/dL — AB (ref 30.0–36.0)
MCV: 93.2 fL (ref 78.0–100.0)
Monocytes Absolute: 1 10*3/uL (ref 0.1–1.0)
Monocytes Relative: 9 % (ref 3–12)
NEUTROS ABS: 6.2 10*3/uL (ref 1.7–7.7)
Neutrophils Relative %: 58 % (ref 43–77)
Platelets: 203 10*3/uL (ref 150–400)
RBC: 4.13 MIL/uL (ref 3.87–5.11)
RDW: 12 % (ref 11.5–15.5)
WBC: 10.7 10*3/uL — ABNORMAL HIGH (ref 4.0–10.5)

## 2013-06-27 LAB — TYPE AND SCREEN
ABO/RH(D): A NEG
ANTIBODY SCREEN: NEGATIVE

## 2013-06-27 LAB — URINALYSIS, ROUTINE W REFLEX MICROSCOPIC
Bilirubin Urine: NEGATIVE
Glucose, UA: NEGATIVE mg/dL
KETONES UR: NEGATIVE mg/dL
Leukocytes, UA: NEGATIVE
NITRITE: NEGATIVE
PH: 5 (ref 5.0–8.0)
Protein, ur: NEGATIVE mg/dL
Specific Gravity, Urine: 1.026 (ref 1.005–1.030)
UROBILINOGEN UA: 0.2 mg/dL (ref 0.0–1.0)

## 2013-06-27 LAB — COMPREHENSIVE METABOLIC PANEL
ALBUMIN: 4.3 g/dL (ref 3.5–5.2)
ALT: 24 U/L (ref 0–35)
AST: 19 U/L (ref 0–37)
Alkaline Phosphatase: 75 U/L (ref 39–117)
BUN: 16 mg/dL (ref 6–23)
CO2: 25 mEq/L (ref 19–32)
CREATININE: 0.72 mg/dL (ref 0.50–1.10)
Calcium: 9.1 mg/dL (ref 8.4–10.5)
Chloride: 101 mEq/L (ref 96–112)
GFR calc Af Amer: >90 mL/min (ref >90)
GFR calc non Af Amer: >90 mL/min (ref >90)
Glucose, Bld: 93 mg/dL (ref 70–99)
POTASSIUM: 4.1 meq/L (ref 3.7–5.3)
Sodium: 140 mEq/L (ref 137–147)
Total Bilirubin: 0.4 mg/dL (ref 0.3–1.2)
Total Protein: 7.3 g/dL (ref 6.0–8.3)

## 2013-06-27 LAB — APTT: aPTT: 26 seconds (ref 24–37)

## 2013-06-27 LAB — ABO/RH: ABO/RH(D): A NEG

## 2013-06-27 LAB — PROTIME-INR
INR: 0.94 (ref 0.00–1.49)
Prothrombin Time: 12.4 seconds (ref 11.6–15.2)

## 2013-06-27 LAB — SURGICAL PCR SCREEN
MRSA, PCR: NEGATIVE
Staphylococcus aureus: NEGATIVE

## 2013-06-27 MED ORDER — CHLORHEXIDINE GLUCONATE 4 % EX LIQD: 60.0000 mL | Freq: Once | CUTANEOUS | Status: DC

## 2013-06-27 NOTE — Pre-Procedure Instructions (Signed)
Pamela Underwood  06/27/2013   Your procedure is scheduled on:  Wed, Feb 4 @ 12:30 PM  Report to [facility]  at 9:30 AM.  Call this number if you have problems the morning of surgery: [phone]   Remember:   Do not eat food or drink liquids after midnight.   Take these medicines the morning of surgery with A SIP OF WATER: Alprazolam(Xanax),Cardizem(Diltiazem),Propafenone(Rythmol),and Pain Pill(if needed)              Stop taking your Aspirin. No Goody's,BC's,Aleve,Ibuprofen,Fish Oil,or any Herbal Medications   Do not wear jewelry, make-up or nail polish.  Do not wear lotions, powders, or perfumes. You may wear deodorant.  Do not shave 48 hours prior to surgery.   Do not bring valuables to the hospital.  [facility] is not responsible                  for any belongings or valuables.               Contacts, dentures or bridgework may not be worn into surgery.  Leave suitcase in the car. After surgery it may be brought to your room.  For patients admitted to the hospital, discharge time is determined by your                treatment team.                 Special Instructions: Shower using CHG 2 nights before surgery and the night before surgery.  If you shower the day of surgery use CHG.  Use special wash - you have one bottle of CHG for all showers.  You should use approximately 1/3 of the bottle for each shower.   Please read over the following fact sheets that you were given: Pain Booklet, Coughing and Deep Breathing, Blood Transfusion Information, MRSA Information and Surgical Site Infection Prevention

## 2013-06-27 NOTE — Progress Notes (Signed)
Dr.Varanasi is cardiologist with last visit in Mar 2014-to request visit  Denies ever having a heart cath  Echo/Stress test done and to be requested from Dr. Irish Lack  EKG to be requested from [place]  Denies CXR in past yr   Pt doesn't have a Medical Md  Sleep study in epic from 2014

## 2013-06-28 ENCOUNTER — Telehealth: Payer: Self-pay | Admitting: Interventional Cardiology

## 2013-06-28 LAB — URINE CULTURE
Colony Count: NO GROWTH
Culture: NO GROWTH

## 2013-06-28 MED ORDER — CEFAZOLIN SODIUM-DEXTROSE 2-3 GM-% IV SOLR
2.0000 g | INTRAVENOUS | Status: AC
  Administered 2013-06-29: 2 g via INTRAVENOUS
  Filled 2013-06-28: qty 50

## 2013-06-28 MED ORDER — CHLORHEXIDINE GLUCONATE 4 % EX LIQD: 60.0000 mL | Freq: Once | CUTANEOUS | Status: DC

## 2013-06-28 MED ORDER — LACTATED RINGERS IV SOLN: INTRAVENOUS | Status: DC

## 2013-06-28 NOTE — Telephone Encounter (Signed)
Received request from Nurse fax box, documents faxed for surgical clearance. To: Person Fax number: [phone] Attention: [phone]/kdm

## 2013-06-29 ENCOUNTER — Inpatient Hospital Stay (HOSPITAL_COMMUNITY): Payer: 59

## 2013-06-29 ENCOUNTER — Encounter (HOSPITAL_COMMUNITY): Payer: 59 | Admitting: Anesthesiology

## 2013-06-29 ENCOUNTER — Inpatient Hospital Stay (HOSPITAL_COMMUNITY)
Admission: RE | Admit: 2013-06-29 | Discharge: 2013-07-01 | DRG: 470 | Disposition: A | Discharge status: Home Health | Payer: 59 | Source: Ambulatory Visit | Attending: Orthopedic Surgery | Admitting: Orthopedic Surgery

## 2013-06-29 ENCOUNTER — Encounter (HOSPITAL_COMMUNITY)
Admission: RE | Disposition: A | Discharge status: Home Health | Payer: Self-pay | Source: Ambulatory Visit | Attending: Orthopedic Surgery

## 2013-06-29 ENCOUNTER — Encounter (HOSPITAL_COMMUNITY): Payer: Self-pay | Admitting: Anesthesiology

## 2013-06-29 ENCOUNTER — Inpatient Hospital Stay (HOSPITAL_COMMUNITY): Payer: 59 | Admitting: Anesthesiology

## 2013-06-29 DIAGNOSIS — J45909 Unspecified asthma, uncomplicated: Secondary | ICD-10-CM | POA: Diagnosis present

## 2013-06-29 DIAGNOSIS — F411 Generalized anxiety disorder: Secondary | ICD-10-CM | POA: Diagnosis present

## 2013-06-29 DIAGNOSIS — I471 Supraventricular tachycardia, unspecified: Secondary | ICD-10-CM | POA: Diagnosis present

## 2013-06-29 DIAGNOSIS — M1710 Unilateral primary osteoarthritis, unspecified knee: Secondary | ICD-10-CM | POA: Diagnosis present

## 2013-06-29 DIAGNOSIS — Z889 Allergy status to unspecified drugs, medicaments and biological substances status: Secondary | ICD-10-CM

## 2013-06-29 DIAGNOSIS — M25569 Pain in unspecified knee: Secondary | ICD-10-CM | POA: Diagnosis present

## 2013-06-29 DIAGNOSIS — M179 Osteoarthritis of knee, unspecified: Secondary | ICD-10-CM | POA: Diagnosis present

## 2013-06-29 DIAGNOSIS — G4733 Obstructive sleep apnea (adult) (pediatric): Secondary | ICD-10-CM | POA: Diagnosis present

## 2013-06-29 DIAGNOSIS — E669 Obesity, unspecified: Secondary | ICD-10-CM | POA: Diagnosis present

## 2013-06-29 DIAGNOSIS — I10 Essential (primary) hypertension: Secondary | ICD-10-CM | POA: Diagnosis present

## 2013-06-29 DIAGNOSIS — Z8249 Family history of ischemic heart disease and other diseases of the circulatory system: Secondary | ICD-10-CM

## 2013-06-29 HISTORY — PX: TOTAL KNEE ARTHROPLASTY: SHX125

## 2013-06-29 SURGERY — ARTHROPLASTY, KNEE, TOTAL
Anesthesia: General | Site: Knee | Laterality: Left

## 2013-06-29 MED ORDER — MORPHINE SULFATE 10 MG/ML IJ SOLN
INTRAMUSCULAR | Status: DC | PRN
  Administered 2013-06-29 (×5): 2 mg via INTRAVENOUS

## 2013-06-29 MED ORDER — BUPIVACAINE LIPOSOME 1.3 % IJ SUSP
20.0000 mL | Freq: Once | INTRAMUSCULAR | Status: DC
  Filled 2013-06-29: qty 20

## 2013-06-29 MED ORDER — FENTANYL CITRATE 0.05 MG/ML IJ SOLN
INTRAMUSCULAR | Status: AC
  Filled 2013-06-29: qty 5

## 2013-06-29 MED ORDER — ZOLPIDEM TARTRATE 5 MG PO TABS
5.0000 mg | ORAL_TABLET | Freq: Every evening | ORAL | Status: DC | PRN
  Administered 2013-06-29: 5 mg via ORAL
  Administered 2013-07-01: 2.5 mg via ORAL
  Filled 2013-06-29 (×2): qty 1

## 2013-06-29 MED ORDER — ASPIRIN EC 325 MG PO TBEC: 325.0000 mg | DELAYED_RELEASE_TABLET | Freq: Every day | ORAL | Status: DC

## 2013-06-29 MED ORDER — MIDAZOLAM HCL 2 MG/2ML IJ SOLN
INTRAMUSCULAR | Status: AC
  Filled 2013-06-29: qty 2

## 2013-06-29 MED ORDER — DEXAMETHASONE 6 MG PO TABS
10.0000 mg | ORAL_TABLET | Freq: Three times a day (TID) | ORAL | Status: AC
  Administered 2013-06-29 – 2013-06-30 (×3): 10 mg via ORAL
  Filled 2013-06-29 (×3): qty 1

## 2013-06-29 MED ORDER — METHOCARBAMOL 500 MG PO TABS: 500.0000 mg | ORAL_TABLET | Freq: Four times a day (QID) | ORAL | Status: DC | PRN

## 2013-06-29 MED ORDER — PROPOFOL 10 MG/ML IV BOLUS
INTRAVENOUS | Status: AC
  Filled 2013-06-29: qty 20

## 2013-06-29 MED ORDER — PROPOFOL 10 MG/ML IV BOLUS
INTRAVENOUS | Status: DC | PRN
  Administered 2013-06-29: 200 mg via INTRAVENOUS

## 2013-06-29 MED ORDER — OXYCODONE-ACETAMINOPHEN 5-325 MG PO TABS: 1.0000 | ORAL_TABLET | ORAL | Status: DC | PRN

## 2013-06-29 MED ORDER — OXYCODONE-ACETAMINOPHEN 5-325 MG PO TABS
1.0000 | ORAL_TABLET | ORAL | Status: DC | PRN
  Administered 2013-06-29 – 2013-07-01 (×10): 2 via ORAL
  Filled 2013-06-29 (×10): qty 2

## 2013-06-29 MED ORDER — DEXAMETHASONE SODIUM PHOSPHATE 10 MG/ML IJ SOLN
10.0000 mg | Freq: Three times a day (TID) | INTRAMUSCULAR | Status: AC
  Filled 2013-06-29 (×3): qty 1

## 2013-06-29 MED ORDER — MIDAZOLAM HCL 2 MG/2ML IJ SOLN
0.5000 mg | Freq: Once | INTRAMUSCULAR | Status: AC | PRN
  Administered 2013-06-29: 2 mg via INTRAVENOUS

## 2013-06-29 MED ORDER — FENTANYL CITRATE 0.05 MG/ML IJ SOLN
50.0000 ug | Freq: Once | INTRAMUSCULAR | Status: AC
  Administered 2013-06-29: 50 ug via INTRAVENOUS

## 2013-06-29 MED ORDER — ACETAMINOPHEN 325 MG PO TABS
650.0000 mg | ORAL_TABLET | Freq: Four times a day (QID) | ORAL | Status: DC | PRN
  Filled 2013-06-29: qty 2

## 2013-06-29 MED ORDER — METOCLOPRAMIDE HCL 5 MG PO TABS
5.0000 mg | ORAL_TABLET | Freq: Three times a day (TID) | ORAL | Status: DC | PRN
  Filled 2013-06-29: qty 2

## 2013-06-29 MED ORDER — GLYCOPYRROLATE 0.2 MG/ML IJ SOLN
INTRAMUSCULAR | Status: AC
  Filled 2013-06-29: qty 3

## 2013-06-29 MED ORDER — ONDANSETRON HCL 4 MG/2ML IJ SOLN: 4.0000 mg | Freq: Four times a day (QID) | INTRAMUSCULAR | Status: DC | PRN

## 2013-06-29 MED ORDER — MIDAZOLAM HCL 2 MG/2ML IJ SOLN
INTRAMUSCULAR | Status: AC
  Administered 2013-06-29: 1 mg
  Filled 2013-06-29: qty 2

## 2013-06-29 MED ORDER — METHOCARBAMOL 100 MG/ML IJ SOLN
500.0000 mg | Freq: Four times a day (QID) | INTRAVENOUS | Status: DC | PRN
  Filled 2013-06-29: qty 5

## 2013-06-29 MED ORDER — ONDANSETRON HCL 4 MG/2ML IJ SOLN
INTRAMUSCULAR | Status: AC
  Filled 2013-06-29: qty 2

## 2013-06-29 MED ORDER — HYDROMORPHONE HCL PF 1 MG/ML IJ SOLN
0.2500 mg | INTRAMUSCULAR | Status: DC | PRN
  Administered 2013-06-29 (×4): 0.5 mg via INTRAVENOUS

## 2013-06-29 MED ORDER — ONDANSETRON HCL 4 MG/2ML IJ SOLN
INTRAMUSCULAR | Status: DC | PRN
  Administered 2013-06-29: 4 mg via INTRAVENOUS

## 2013-06-29 MED ORDER — HYDROMORPHONE HCL PF 1 MG/ML IJ SOLN
0.5000 mg | INTRAMUSCULAR | Status: DC | PRN
  Administered 2013-06-29 – 2013-06-30 (×9): 0.5 mg via INTRAVENOUS
  Filled 2013-06-29 (×10): qty 1

## 2013-06-29 MED ORDER — METOCLOPRAMIDE HCL 5 MG/ML IJ SOLN
INTRAMUSCULAR | Status: DC | PRN
  Administered 2013-06-29: 10 mg via INTRAVENOUS

## 2013-06-29 MED ORDER — ARTIFICIAL TEARS OP OINT
TOPICAL_OINTMENT | OPHTHALMIC | Status: DC | PRN
  Administered 2013-06-29: 1 via OPHTHALMIC

## 2013-06-29 MED ORDER — BUPIVACAINE HCL (PF) 0.25 % IJ SOLN
INTRAMUSCULAR | Status: DC | PRN
  Administered 2013-06-29: 10 mL

## 2013-06-29 MED ORDER — SORBITOL 70 % SOLN: 30.0000 mL | Freq: Every day | Status: DC | PRN

## 2013-06-29 MED ORDER — SCOPOLAMINE 1 MG/3DAYS TD PT72
MEDICATED_PATCH | TRANSDERMAL | Status: AC
  Administered 2013-06-29: 1.5 mg via TRANSDERMAL
  Filled 2013-06-29: qty 1

## 2013-06-29 MED ORDER — METOCLOPRAMIDE HCL 5 MG/ML IJ SOLN: 10.0000 mg | Freq: Once | INTRAMUSCULAR | Status: DC | PRN

## 2013-06-29 MED ORDER — METOCLOPRAMIDE HCL 5 MG/ML IJ SOLN
INTRAMUSCULAR | Status: AC
  Filled 2013-06-29: qty 2

## 2013-06-29 MED ORDER — METHOCARBAMOL 500 MG PO TABS
500.0000 mg | ORAL_TABLET | Freq: Four times a day (QID) | ORAL | Status: DC | PRN
  Administered 2013-06-29 – 2013-07-01 (×8): 500 mg via ORAL
  Filled 2013-06-29 (×8): qty 1

## 2013-06-29 MED ORDER — FENTANYL CITRATE 0.05 MG/ML IJ SOLN
INTRAMUSCULAR | Status: DC | PRN
Start: 2013-06-29 — End: 2013-06-29
  Administered 2013-06-29: 100 ug via INTRAVENOUS
  Administered 2013-06-29: 50 ug via INTRAVENOUS
  Administered 2013-06-29: 150 ug via INTRAVENOUS
  Administered 2013-06-29: 100 ug via INTRAVENOUS

## 2013-06-29 MED ORDER — OXYCODONE HCL 5 MG/5ML PO SOLN: 5.0000 mg | Freq: Once | ORAL | Status: DC | PRN

## 2013-06-29 MED ORDER — CEFAZOLIN SODIUM-DEXTROSE 2-3 GM-% IV SOLR
2.0000 g | Freq: Four times a day (QID) | INTRAVENOUS | Status: AC
  Administered 2013-06-29 (×2): 2 g via INTRAVENOUS
  Filled 2013-06-29 (×2): qty 50

## 2013-06-29 MED ORDER — NEOSTIGMINE METHYLSULFATE 1 MG/ML IJ SOLN
INTRAMUSCULAR | Status: DC | PRN
  Administered 2013-06-29: 2 mg via INTRAVENOUS
  Administered 2013-06-29 (×2): 1 mg via INTRAVENOUS

## 2013-06-29 MED ORDER — MIDAZOLAM HCL 2 MG/2ML IJ SOLN: 1.0000 mg | Freq: Once | INTRAMUSCULAR | Status: DC

## 2013-06-29 MED ORDER — DILTIAZEM HCL ER COATED BEADS 120 MG PO TB24
120.0000 mg | ORAL_TABLET | Freq: Every day | ORAL | Status: DC
  Filled 2013-06-29: qty 1

## 2013-06-29 MED ORDER — ACETAMINOPHEN 650 MG RE SUPP: 650.0000 mg | Freq: Four times a day (QID) | RECTAL | Status: DC | PRN

## 2013-06-29 MED ORDER — FENTANYL CITRATE 0.05 MG/ML IJ SOLN
INTRAMUSCULAR | Status: AC
  Administered 2013-06-29: 50 ug via INTRAVENOUS
  Filled 2013-06-29: qty 2

## 2013-06-29 MED ORDER — POTASSIUM CHLORIDE IN NACL 20-0.9 MEQ/L-% IV SOLN
INTRAVENOUS | Status: DC
  Administered 2013-06-29 – 2013-06-30 (×2): via INTRAVENOUS
  Filled 2013-06-29 (×4): qty 1000

## 2013-06-29 MED ORDER — HYDROMORPHONE HCL PF 1 MG/ML IJ SOLN
INTRAMUSCULAR | Status: AC
  Filled 2013-06-29: qty 1

## 2013-06-29 MED ORDER — DEXAMETHASONE SODIUM PHOSPHATE 4 MG/ML IJ SOLN
INTRAMUSCULAR | Status: AC
  Filled 2013-06-29: qty 1

## 2013-06-29 MED ORDER — SODIUM CHLORIDE 0.9 % IJ SOLN
INTRAMUSCULAR | Status: DC | PRN
  Administered 2013-06-29: 13:00:00

## 2013-06-29 MED ORDER — SCOPOLAMINE 1 MG/3DAYS TD PT72
1.0000 | MEDICATED_PATCH | TRANSDERMAL | Status: DC
  Administered 2013-06-29: 1.5 mg via TRANSDERMAL

## 2013-06-29 MED ORDER — DEXAMETHASONE SODIUM PHOSPHATE 4 MG/ML IJ SOLN
INTRAMUSCULAR | Status: DC | PRN
  Administered 2013-06-29: 4 mg via INTRAVENOUS

## 2013-06-29 MED ORDER — LIDOCAINE HCL (CARDIAC) 20 MG/ML IV SOLN
INTRAVENOUS | Status: DC | PRN
  Administered 2013-06-29: 60 mg via INTRAVENOUS

## 2013-06-29 MED ORDER — BUPIVACAINE HCL (PF) 0.25 % IJ SOLN
INTRAMUSCULAR | Status: AC
  Filled 2013-06-29: qty 30

## 2013-06-29 MED ORDER — ONDANSETRON HCL 4 MG PO TABS: 4.0000 mg | ORAL_TABLET | Freq: Four times a day (QID) | ORAL | Status: DC | PRN

## 2013-06-29 MED ORDER — DILTIAZEM HCL ER COATED BEADS 120 MG PO CP24
120.0000 mg | ORAL_CAPSULE | Freq: Every day | ORAL | Status: DC
  Administered 2013-06-29 – 2013-06-30 (×2): 120 mg via ORAL
  Filled 2013-06-29 (×4): qty 1

## 2013-06-29 MED ORDER — METHOCARBAMOL 500 MG PO TABS
ORAL_TABLET | ORAL | Status: AC
  Filled 2013-06-29: qty 1

## 2013-06-29 MED ORDER — LACTATED RINGERS IV SOLN
INTRAVENOUS | Status: DC
  Administered 2013-06-29: 10:00:00 via INTRAVENOUS

## 2013-06-29 MED ORDER — NEOSTIGMINE METHYLSULFATE 1 MG/ML IJ SOLN
INTRAMUSCULAR | Status: AC
  Filled 2013-06-29: qty 10

## 2013-06-29 MED ORDER — MENTHOL 3 MG MT LOZG: 1.0000 | LOZENGE | OROMUCOSAL | Status: DC | PRN

## 2013-06-29 MED ORDER — ASPIRIN EC 325 MG PO TBEC
325.0000 mg | DELAYED_RELEASE_TABLET | Freq: Every day | ORAL | Status: DC
  Administered 2013-06-30 – 2013-07-01 (×2): 325 mg via ORAL
  Filled 2013-06-29 (×3): qty 1

## 2013-06-29 MED ORDER — LIDOCAINE HCL (CARDIAC) 20 MG/ML IV SOLN
INTRAVENOUS | Status: AC
  Filled 2013-06-29: qty 5

## 2013-06-29 MED ORDER — SODIUM CHLORIDE 0.9 % IJ SOLN
INTRAMUSCULAR | Status: AC
  Filled 2013-06-29: qty 12

## 2013-06-29 MED ORDER — DIPHENHYDRAMINE HCL 12.5 MG/5ML PO ELIX: 12.5000 mg | ORAL_SOLUTION | ORAL | Status: DC | PRN

## 2013-06-29 MED ORDER — ROCURONIUM BROMIDE 50 MG/5ML IV SOLN
INTRAVENOUS | Status: AC
  Filled 2013-06-29: qty 1

## 2013-06-29 MED ORDER — DEXTROSE 5 % IV SOLN
INTRAVENOUS | Status: DC | PRN
  Administered 2013-06-29: 13:00:00 via INTRAVENOUS

## 2013-06-29 MED ORDER — PHENOL 1.4 % MT LIQD: 1.0000 | OROMUCOSAL | Status: DC | PRN

## 2013-06-29 MED ORDER — ROCURONIUM BROMIDE 100 MG/10ML IV SOLN
INTRAVENOUS | Status: DC | PRN
  Administered 2013-06-29: 50 mg via INTRAVENOUS

## 2013-06-29 MED ORDER — OXYCODONE HCL 5 MG PO TABS: 5.0000 mg | ORAL_TABLET | Freq: Once | ORAL | Status: DC | PRN

## 2013-06-29 MED ORDER — PROPAFENONE HCL ER 225 MG PO CP12
225.0000 mg | ORAL_CAPSULE | Freq: Two times a day (BID) | ORAL | Status: DC
  Administered 2013-06-29 – 2013-07-01 (×4): 225 mg via ORAL
  Filled 2013-06-29 (×6): qty 1

## 2013-06-29 MED ORDER — SODIUM CHLORIDE 0.9 % IR SOLN
Status: DC | PRN
  Administered 2013-06-29: 1000 mL

## 2013-06-29 MED ORDER — CELECOXIB 200 MG PO CAPS
200.0000 mg | ORAL_CAPSULE | Freq: Two times a day (BID) | ORAL | Status: DC
  Administered 2013-06-29 – 2013-07-01 (×4): 200 mg via ORAL
  Filled 2013-06-29 (×5): qty 1

## 2013-06-29 MED ORDER — MORPHINE SULFATE 10 MG/ML IJ SOLN
INTRAMUSCULAR | Status: AC
  Filled 2013-06-29: qty 1

## 2013-06-29 MED ORDER — METOCLOPRAMIDE HCL 5 MG/ML IJ SOLN: 5.0000 mg | Freq: Three times a day (TID) | INTRAMUSCULAR | Status: DC | PRN

## 2013-06-29 MED ORDER — LACTATED RINGERS IV SOLN
INTRAVENOUS | Status: DC | PRN
  Administered 2013-06-29 (×3): via INTRAVENOUS

## 2013-06-29 MED ORDER — GLYCOPYRROLATE 0.2 MG/ML IJ SOLN
INTRAMUSCULAR | Status: DC | PRN
Start: 2013-06-29 — End: 2013-06-29
  Administered 2013-06-29: 0.2 mg via INTRAVENOUS
  Administered 2013-06-29: .3 mg via INTRAVENOUS
  Administered 2013-06-29: .1 mg via INTRAVENOUS

## 2013-06-29 MED ORDER — ALPRAZOLAM 0.5 MG PO TABS: 0.5000 mg | ORAL_TABLET | Freq: Every evening | ORAL | Status: DC | PRN

## 2013-06-29 MED ORDER — LIDOCAINE HCL (CARDIAC) 20 MG/ML IV SOLN
INTRAVENOUS | Status: AC
Start: 2013-06-29 — End: 2013-06-29
  Filled 2013-06-29: qty 5

## 2013-06-29 SURGICAL SUPPLY — 53 items
BANDAGE ESMARK 6X9 LF (GAUZE/BANDAGES/DRESSINGS) ×1 IMPLANT
BENZOIN TINCTURE PRP APPL 2/3 (GAUZE/BANDAGES/DRESSINGS) ×2 IMPLANT
BLADE SAG 18X100X1.27 (BLADE) ×4 IMPLANT
BNDG ESMARK 6X9 LF (GAUZE/BANDAGES/DRESSINGS) ×2
BOWL SMART MIX CTS (DISPOSABLE) ×2 IMPLANT
CEMENT BONE SIMPLEX SPEEDSET (Cement) ×4 IMPLANT
COVER SURGICAL LIGHT HANDLE (MISCELLANEOUS) ×2 IMPLANT
CUFF TOURNIQUET SINGLE 34IN LL (TOURNIQUET CUFF) ×2 IMPLANT
DRAPE EXTREMITY T 121X128X90 (DRAPE) ×2 IMPLANT
DRAPE PROXIMA HALF (DRAPES) IMPLANT
DRAPE U-SHAPE 47X51 STRL (DRAPES) ×2 IMPLANT
DRSG PAD ABDOMINAL 8X10 ST (GAUZE/BANDAGES/DRESSINGS) ×2 IMPLANT
DURAPREP 26ML APPLICATOR (WOUND CARE) ×4 IMPLANT
ELECT CAUTERY BLADE 6.4 (BLADE) ×2 IMPLANT
ELECT REM PT RETURN 9FT ADLT (ELECTROSURGICAL) ×2
ELECTRODE REM PT RTRN 9FT ADLT (ELECTROSURGICAL) ×1 IMPLANT
EVACUATOR 1/8 PVC DRAIN (DRAIN) ×2 IMPLANT
FACESHIELD LNG OPTICON STERILE (SAFETY) ×4 IMPLANT
GLOVE BIO SURGEON STRL SZ 6.5 (GLOVE) ×4 IMPLANT
GLOVE BIOGEL PI IND STRL 7.0 (GLOVE) ×2 IMPLANT
GLOVE BIOGEL PI INDICATOR 7.0 (GLOVE) ×2
GLOVE ORTHO TXT STRL SZ7.5 (GLOVE) ×2 IMPLANT
GOWN STRL REUS W/TWL 2XL LVL3 (GOWN DISPOSABLE) IMPLANT
HANDPIECE INTERPULSE COAX TIP (DISPOSABLE) ×1
IMMOBILIZER KNEE 22 UNIV (SOFTGOODS) ×2 IMPLANT
IMMOBILIZER KNEE 24 THIGH 36 (MISCELLANEOUS) IMPLANT
IMMOBILIZER KNEE 24 UNIV (MISCELLANEOUS)
KIT BASIN OR (CUSTOM PROCEDURE TRAY) ×2 IMPLANT
KIT ROOM TURNOVER OR (KITS) ×2 IMPLANT
KNEE/VIT E POLY LINER LEVEL 1B ×2 IMPLANT
MANIFOLD NEPTUNE II (INSTRUMENTS) ×2 IMPLANT
NEEDLE HYPO 25GX1X1/2 BEV (NEEDLE) ×2 IMPLANT
NS IRRIG 1000ML POUR BTL (IV SOLUTION) ×2 IMPLANT
PACK TOTAL JOINT (CUSTOM PROCEDURE TRAY) ×2 IMPLANT
PAD ARMBOARD 7.5X6 YLW CONV (MISCELLANEOUS) ×4 IMPLANT
PAD CAST 4YDX4 CTTN HI CHSV (CAST SUPPLIES) ×1 IMPLANT
PADDING CAST COTTON 4X4 STRL (CAST SUPPLIES) ×1
PADDING CAST COTTON 6X4 STRL (CAST SUPPLIES) ×2 IMPLANT
SET HNDPC FAN SPRY TIP SCT (DISPOSABLE) ×1 IMPLANT
SPONGE GAUZE 4X4 12PLY (GAUZE/BANDAGES/DRESSINGS) ×2 IMPLANT
STRIP CLOSURE SKIN 1/2X4 (GAUZE/BANDAGES/DRESSINGS) ×4 IMPLANT
SUCTION FRAZIER TIP 10 FR DISP (SUCTIONS) ×2 IMPLANT
SUT MNCRL AB 4-0 PS2 18 (SUTURE) ×2 IMPLANT
SUT MON AB 2-0 CT1 36 (SUTURE) ×2 IMPLANT
SUT VIC AB 0 CT1 27 (SUTURE) ×1
SUT VIC AB 0 CT1 27XBRD ANBCTR (SUTURE) ×1 IMPLANT
SUT VIC AB 2-0 CT1 27 (SUTURE) ×1
SUT VIC AB 2-0 CT1 TAPERPNT 27 (SUTURE) ×1 IMPLANT
SYR 50ML LL SCALE MARK (SYRINGE) ×2 IMPLANT
SYR CONTROL 10ML LL (SYRINGE) ×2 IMPLANT
TOWEL OR 17X24 6PK STRL BLUE (TOWEL DISPOSABLE) ×2 IMPLANT
TOWEL OR 17X26 10 PK STRL BLUE (TOWEL DISPOSABLE) ×2 IMPLANT
WATER STERILE IRR 1000ML POUR (IV SOLUTION) ×4 IMPLANT

## 2013-06-29 NOTE — H&P (View-Only) (Signed)
TOTAL KNEE ADMISSION H&P  Patient is being admitted for left total knee arthroplasty.  Subjective:  Chief Complaint:left knee pain.  HPI: Pamela Underwood, 58 y.o. female, has a history of pain and functional disability in the left knee due to arthritis and has failed non-surgical conservative treatments for greater than 12 weeks to includeNSAID's and/or analgesics, corticosteriod injections, viscosupplementation injections, flexibility and strengthening excercises, supervised PT with diminished ADL's post treatment, use of assistive devices and activity modification.  Onset of symptoms was gradual, starting >10 years ago with gradually worsening course since that time. The patient noted prior procedures on the knee to include  arthroscopy and menisectomy on the left knee(s).  Patient currently rates pain in the left knee(s) at 3 out of 10 with activity. Patient has night pain, worsening of pain with activity and weight bearing, pain that interferes with activities of daily living, pain with passive range of motion, crepitus and joint swelling.  Patient has evidence of periarticular osteophytes and joint space narrowing by imaging studies. There is no active infection.  Patient Active Problem List   Diagnosis Date Noted  . Paroxysmal supraventricular tachycardia 05/09/2013  . Fatigue 05/09/2013  . Screening for hypercholesterolemia 05/09/2013  . OSA (obstructive sleep apnea)   . Obesity (BMI 30-39.9)    Past Medical History  Diagnosis Date  . Wears glasses   . Sleep apnea     has a cpap-mild-moderate  . Atrial tachycardia   . Mechanical low back pain   . Chronic anxiety   . Obesity (BMI 30-39.9)     Past Surgical History  Procedure Laterality Date  . Tubal ligation    . Colonoscopy    . Breast surgery  1986    lt br bx-neg  . Knee arthroscopy with medial menisectomy Left 03/31/2013    Procedure: LEFT KNEE ARTHROSCOPY WITH PARTIAL MEDIAL MENISECTOMY, CHONDROPLASTY OF PATELLA  FEMORAL  JOINT, EXCISION OF MEDIAL PLICA, PARTIAL LATERAL MENISECTOMY;  Surgeon: [name], MD;  Location: [facility];  Service: Orthopedics;  Laterality: Left;     (Not in a hospital admission) Allergies  Allergen Reactions  . Escitalopram Oxalate Other (See Comments)    Hives, welts, all over skin rash    History  Substance Use Topics  . Smoking status: Never Smoker   . Smokeless tobacco: Not on file  . Alcohol Use: Yes     Comment: occ    Family History  Problem Relation Age of Onset  . Heart disease Father      Review of Systems  Constitutional: Negative.   HENT: Negative.   Eyes: Negative.   Respiratory: Negative.   Cardiovascular: Negative.   Gastrointestinal: Negative.   Genitourinary: Negative.   Musculoskeletal: Positive for back pain and joint pain.  Skin: Negative.   Neurological: Negative.   Endo/Heme/Allergies: Negative.   Psychiatric/Behavioral: The patient is nervous/anxious.     Objective:  Physical Exam  Constitutional: She is oriented to person, place, and time. She appears well-developed and well-nourished.  HENT:  Head: Normocephalic and atraumatic.  Eyes: EOM are normal. Pupils are equal, round, and reactive to light.  Neck: Normal range of motion. Neck supple.  Cardiovascular: Normal rate and regular rhythm.  Exam reveals no gallop and no friction rub.   No murmur heard. Respiratory: Effort normal. No respiratory distress. She has no wheezes. She has no rales.  GI: Soft. Bowel sounds are normal.  Musculoskeletal:  1+ effusion on the left. Mildly increased q-angle lateral tracking some tethering.  Normal femoral version. Not a lot of crepitus. Motion from about 0-120 degrees. Ligaments stable.  She is neurovascularly intact distally.  Neurological: She is alert and oriented to person, place, and time.  Skin: Skin is warm and dry.  Psychiatric: She has a normal mood and affect. Her behavior is normal. Judgment and thought content  normal.    Vital signs in last 24 hours: @VSRANGES @  Labs:   Estimated body mass index is 28.74 kg/(m^2) as calculated from the following:   Height as of 05/09/13: 5\' 8"  (1.727 m).   Weight as of 05/09/13: 85.73 kg (189 lb).   Imaging Review Plain radiographs demonstrate moderate degenerative joint disease of the left knee(s). The overall alignment isneutral. The bone quality appears to be fair for age and reported activity level.  Assessment/Plan:  End stage arthritis, left knee   The patient history, physical examination, clinical judgment of the provider and imaging studies are consistent with end stage degenerative joint disease of the left knee(s) and total knee arthroplasty is deemed medically necessary. The treatment options including medical management, injection therapy arthroscopy and arthroplasty were discussed at length. The risks and benefits of total knee arthroplasty were presented and reviewed. The risks due to aseptic loosening, infection, stiffness, patella tracking problems, thromboembolic complications and other imponderables were discussed. The patient acknowledged the explanation, agreed to proceed with the plan and consent was signed. Patient is being admitted for inpatient treatment for surgery, pain control, PT, OT, prophylactic antibiotics, VTE prophylaxis, progressive ambulation and ADL's and discharge planning. The patient is planning to be discharged home with home health services

## 2013-06-29 NOTE — Progress Notes (Signed)
Patient voiced complaint of knee and headache pain. Nurse called Dr. Albertina Parr and informed him of this and he ordered for Nurse to give patient 1-2 mg of Versed and Fentanyl. Will administer as ordered.

## 2013-06-29 NOTE — Interval H&P Note (Signed)
History and Physical Interval Note:  06/29/2013 8:19 AM  Pamela Underwood  has presented today for surgery, with the diagnosis of DJD LEFT KNEE  The various methods of treatment have been discussed with the patient and family. After consideration of risks, benefits and other options for treatment, the patient has consented to  Procedure(s): TOTAL KNEE ARTHROPLASTY (Left) as a surgical intervention .  The patient's history has been reviewed, patient examined, no change in status, stable for surgery.  I have reviewed the patient's chart and labs.  Questions were answered to the patient's satisfaction.     Marylou Wages F

## 2013-06-29 NOTE — Progress Notes (Signed)
Pt tearful. Order received to give versed 2 mg in pauc per Dr Ola Spurr

## 2013-06-29 NOTE — Anesthesia Postprocedure Evaluation (Signed)
Anesthesia Post Note  Patient: Pamela Underwood  Procedure(s) Performed: Procedure(s) (LRB): TOTAL KNEE ARTHROPLASTY (Left)  Anesthesia type: General  Patient location: PACU  Post pain: Pain level controlled  Post assessment: Patient's Cardiovascular Status Stable  Last Vitals:  Filed Vitals:   06/29/13 1449  BP: 153/78  Pulse: 80  Temp:   Resp: 15    Post vital signs: Reviewed and stable  Level of consciousness: alert  Complications: No apparent anesthesia complications

## 2013-06-29 NOTE — Anesthesia Preprocedure Evaluation (Addendum)
Anesthesia Evaluation  Patient identified by MRN, date of birth, ID band Patient awake    Reviewed: Allergy & Precautions, H&P , NPO status , Patient's Chart, lab work & pertinent test results, reviewed documented beta blocker date and time   Airway Mallampati: II TM Distance: >3 FB Neck ROM: full    Dental  (+) Teeth Intact and Dental Advisory Given   Pulmonary asthma , sleep apnea and Continuous Positive Airway Pressure Ventilation ,  breath sounds clear to auscultation        Cardiovascular hypertension, Pt. on medications + dysrhythmias Supra Ventricular Tachycardia Rhythm:regular  Took Rhythmol this morning   Neuro/Psych negative neurological ROS  negative psych ROS   GI/Hepatic negative GI ROS, Neg liver ROS,   Endo/Other  negative endocrine ROS  Renal/GU negative Renal ROS  negative genitourinary   Musculoskeletal   Abdominal   Peds  Hematology negative hematology ROS (+)   Anesthesia Other Findings See surgeon's H&P   Reproductive/Obstetrics negative OB ROS                          Anesthesia Physical Anesthesia Plan  ASA: II  Anesthesia Plan: General   Post-op Pain Management:    Induction: Intravenous  Airway Management Planned: Oral ETT  Additional Equipment:   Intra-op Plan:   Post-operative Plan: Extubation in OR  Informed Consent: I have reviewed the patients History and Physical, chart, labs and discussed the procedure including the risks, benefits and alternatives for the proposed anesthesia with the patient or authorized representative who has indicated his/her understanding and acceptance.   Dental Advisory Given  Plan Discussed with: CRNA and Surgeon  Anesthesia Plan Comments:         Anesthesia Quick Evaluation

## 2013-06-29 NOTE — Discharge Summary (Signed)
Patient ID: Pamela Underwood MRN: [id number] DOB/AGE: 1956/03/27 58 y.o.  Admit date: 06/29/2013 Discharge date: 07/01/2013  Admission Diagnoses:  Active Problems:   DJD (degenerative joint disease) of knee   Discharge Diagnoses:  Same  Past Medical History  Diagnosis Date  . Insomnia     takes Ambien nightly as needed  . Atrial tachycardia     takes rythmol daily and cardiazem daily  . Asthma     as a child  . Sleep apnea     uses a CPAP  . Arthritis   . Joint pain   . Joint swelling   . Back pain     occasionally  . Colitis     hx-one  . Chronic anxiety     takes Xanax daily as needed when heart rate going up    Surgeries: Procedure(s): TOTAL KNEE ARTHROPLASTY on 06/29/2013   Consultants:    Discharged Condition: Improved  Hospital Course: Pamela Underwood is an 58 y.o. female who was admitted 06/29/2013 for operative treatment of<principal problem not specified>. Patient has severe unremitting pain that affects sleep, daily activities, and work/hobbies. After pre-op clearance the patient was taken to the operating room on 06/29/2013 and underwent  Procedure(s): TOTAL KNEE ARTHROPLASTY.    Patient was given perioperative antibiotics:     Anti-infectives   Start     Dose/Rate Route Frequency Ordered Stop   06/29/13 1800  ceFAZolin (ANCEF) IVPB 2 g/50 mL premix     2 g 100 mL/hr over 30 Minutes Intravenous Every 6 hours 06/29/13 1642 06/29/13 2353   06/29/13 0600  ceFAZolin (ANCEF) IVPB 2 g/50 mL premix     2 g 100 mL/hr over 30 Minutes Intravenous On call to O.R. 06/28/13 1439 06/29/13 1235       Patient was given sequential compression devices, early ambulation, and chemoprophylaxis to prevent DVT.  Patient benefited maximally from hospital stay and there were no complications.    Recent vital signs:  Patient Vitals for the past 24 hrs:  BP Temp Temp src Pulse Resp SpO2  07/01/13 0526 125/55 mmHg 98.4 F (36.9 C) - 81 18 97 %  07/01/13 0400 - - - - 18 99 %   07/01/13 0000 - - - - 18 97 %  06/30/13 2006 113/54 mmHg 99.2 F (37.3 C) Oral 87 18 96 %  06/30/13 2000 - - - - 18 96 %  06/30/13 1411 121/55 mmHg 98.2 F (36.8 C) - 74 18 96 %     Recent laboratory studies:   Recent Labs  06/30/13 0600 07/01/13 0355  WBC 12.2* 17.9*  HGB 11.4* 10.7*  HCT 32.3* 30.2*  PLT 198 186  NA 138 143  K 4.7 4.4  CL 101 106  CO2 24 24  BUN 14 14  CREATININE 0.74 0.68  GLUCOSE 162* 148*  CALCIUM 8.5 8.8     Discharge Medications:     Medication List    STOP taking these medications       HYDROcodone-acetaminophen 5-325 MG per tablet  Commonly known as:  NORCO/VICODIN      TAKE these medications       ALPRAZolam 0.5 MG tablet  Commonly known as:  XANAX  Take 0.5 mg by mouth at bedtime as needed. For stress/anxiety     aspirin EC 325 MG tablet  Take 1 tablet (325 mg total) by mouth daily.     diltiazem 120 MG 24 hr tablet  Commonly known as:  CARDIZEM LA  Take  120 mg by mouth daily.     methocarbamol 500 MG tablet  Commonly known as:  ROBAXIN  Take 1 tablet (500 mg total) by mouth every 6 (six) hours as needed for muscle spasms.     OxyCODONE 10 mg T12a 12 hr tablet  Commonly known as:  OXYCONTIN  Take 1 tablet (10 mg total) by mouth every 12 (twelve) hours. PRN pain.     oxyCODONE-acetaminophen 5-325 MG per tablet  Commonly known as:  ROXICET  Take 1-2 tablets by mouth every 4 (four) hours as needed for severe pain.     propafenone 225 MG 12 hr capsule  Commonly known as:  RYTHMOL SR  Take 225 mg by mouth every 12 (twelve) hours.     VITAMIN D (CHOLECALCIFEROL) PO  Take 1 tablet by mouth daily.     zolpidem 10 MG tablet  Commonly known as:  AMBIEN  - Take 5-10 mg by mouth at bedtime as needed. For sleep  -         Diagnostic Studies: Dg Chest 2 View  06/27/2013   CLINICAL DATA:  Pre admit knee surgery  EXAM: CHEST  2 VIEW  COMPARISON:  DG CHEST 2 VIEW dated 03/26/2004  FINDINGS: Normal mediastinum and cardiac  silhouette. Normal pulmonary vasculature. No evidence of effusion, infiltrate, or pneumothorax. No acute bony abnormality.  IMPRESSION: No acute cardiopulmonary process.   Electronically Signed   By: Suzy Bouchard M.D.   On: 06/27/2013 15:26   Dg Knee Left Port  06/29/2013   CLINICAL DATA:  Knee replacement  EXAM: PORTABLE LEFT KNEE - 1-2 VIEW  COMPARISON:  None.  FINDINGS: The left knee demonstrates a total knee arthroplasty without evidence of hardware failure complication. There is no significant joint effusion. There is no fracture or dislocation. The alignment is anatomic. Surgical drains are present. Post-surgical changes noted in the surrounding soft tissues.  IMPRESSION: Left total knee arthroplasty.   Electronically Signed   By: Kathreen Devoid   On: 06/29/2013 15:39    Disposition: 01-Home or Self Care  Discharge Orders   Future Appointments Provider Department Dept Phone   08/08/2013 3:30 PM Casandra Doffing, MD [place] [phone]   Future Orders Complete By Expires   Call MD / Call 911  As directed    Comments:     If you experience chest pain or shortness of breath, CALL 911 and be transported to the hospital emergency room.  If you develope a fever above 101 F, pus (white drainage) or increased drainage or redness at the wound, or calf pain, call your surgeon's office.   Change dressing  As directed    Comments:     Change dressing on Saturday, then change the dressing daily with sterile 4 x 4 inch gauze dressing and apply TED hose.  You may clean the incision with alcohol prior to redressing.   Constipation Prevention  As directed    Comments:     Drink plenty of fluids.  Prune juice may be helpful.  You may use a stool softener, such as Colace (over the counter) 100 mg twice a day.  Use MiraLax (over the counter) for constipation as needed.   CPM  As directed    Comments:     Continuous passive motion machine (CPM):      Use the CPM from 0- to 60 for 6 hours  per day.      You may increase by 10 per day.  You  may break it up into 2 or 3 sessions per day.      Use CPM for 3-4 weeks or until you are told to stop.   Diet - low sodium heart healthy  As directed    Discharge instructions  As directed    Comments:     Total Knee Replacement Care After Refer to this sheet in the next few weeks. These discharge instructions provide you with general information on caring for yourself after you leave the hospital. Your caregiver may also give you specific instructions. Your treatment has been planned according to the most current medical practices available, but unavoidable complications sometimes occur. If you have any problems or questions after discharge, please call your caregiver. Regaining a near full range of motion of your knee within the first 3 to 6 weeks after surgery is critical. [place] may resume a normal diet and activities as directed.  Perform exercises as directed.  Place gray foam block, curve side up under heel at all times except when in CPM or when walking.  DO NOT modify, tear, cut, or change in any way the gray foam block. You will receive physical therapy daily  Take showers instead of baths until informed otherwise.  You may shower on Sunday.  Please wash whole leg including wound with soap and water  Change bandages (dressings)daily It is OK to take over-the-counter tylenol in addition to the oxycodone for pain, discomfort, or fever. Oxycodone is VERY constipating.  Please take stool softener twice a day and laxatives daily until bowels are regular Eat a well-balanced diet.  Avoid lifting or driving until you are instructed otherwise.  Make an appointment to see your caregiver for stitches (suture) or staple removal as directed.  If you have been sent home with a continuous passive motion machine (CPM machine), 0-90 degrees 6 hrs a day   2 hrs a shift SEEK MEDICAL CARE IF: You have swelling of your calf or leg.   You develop shortness of breath or chest pain.  You have redness, swelling, or increasing pain in the wound.  There is pus or any unusual drainage coming from the surgical site.  You notice a bad smell coming from the surgical site or dressing.  The surgical site breaks open after sutures or staples have been removed.  There is persistent bleeding from the suture or staple line.  You are getting worse or are not improving.  You have any other questions or concerns.  SEEK IMMEDIATE MEDICAL CARE IF:  You have a fever.  You develop a rash.  You have difficulty breathing.  You develop any reaction or side effects to medicines given.  Your knee motion is decreasing rather than improving.  MAKE SURE YOU:  Understand these instructions.  Will watch your condition.  Will get help right away if you are not doing well or get worse.   Do not put a pillow under the knee. Place it under the heel.  As directed    Comments:     Place gray foam under operative heel when in bed or in a chair to work on extension   Increase activity slowly as tolerated  As directed    TED hose  As directed    Comments:     Use stockings (TED hose) for 2 weeks on both leg(s).  You may remove them at night for sleeping.      Follow-up Information   Schedule an appointment as soon  as possible for a visit in 2 weeks to follow up.       SignedLarae Grooms 07/01/2013, 7:44 AM

## 2013-06-29 NOTE — Progress Notes (Signed)
hemavac output is 400, hemavac is clamped

## 2013-06-29 NOTE — Anesthesia Procedure Notes (Addendum)
Procedure Name: Intubation Date/Time: 06/29/2013 12:33 PM Performed by: Terrill Mohr Pre-anesthesia Checklist: Patient identified, Emergency Drugs available, Suction available and Patient being monitored Patient Re-evaluated:Patient Re-evaluated prior to inductionOxygen Delivery Method: Circle system utilized Preoxygenation: Pre-oxygenation with 100% oxygen Intubation Type: IV induction Ventilation: Mask ventilation without difficulty Laryngoscope Size: Mac and 3 Grade View: Grade II Tube type: Oral Tube size: 7.5 mm Airway Equipment and Method: Stylet Placement Confirmation: ETT inserted through vocal cords under direct vision,  breath sounds checked- equal and bilateral and positive ETCO2 (cords a bit anterior; Sellick's helped) Secured at: 21 (cm at teeth) cm Tube secured with: Tape Dental Injury: Teeth and Oropharynx as per pre-operative assessment

## 2013-06-29 NOTE — Transfer of Care (Signed)
Immediate Anesthesia Transfer of Care Note  Patient: Pamela Underwood  Procedure(s) Performed: Procedure(s): TOTAL KNEE ARTHROPLASTY (Left)  Patient Location: PACU  Anesthesia Type:General  Level of Consciousness: awake, alert , oriented and patient cooperative  Airway & Oxygen Therapy: Patient Spontanous Breathing and Patient connected to nasal cannula oxygen  Post-op Assessment: Report given to PACU RN, Post -op Vital signs reviewed and stable and Patient moving all extremities  Post vital signs: Reviewed and stable  Complications: No apparent anesthesia complications

## 2013-06-29 NOTE — Preoperative (Signed)
Beta Blockers   Reason not to administer Beta Blockers:Not Applicable 

## 2013-06-29 NOTE — Progress Notes (Signed)
Orthopedic Tech Progress Note Patient Details:  Pamela Underwood Oct 01, 1955 [id number]  CPM Left Knee CPM Left Knee: On Left Knee Flexion (Degrees): 60 Left Knee Extension (Degrees): 0 Trapeze bar patient helper; footsie roll Viewed order from doctor's order list Hildred Priest 06/29/2013, 3:09 PM

## 2013-06-30 ENCOUNTER — Encounter: Payer: Self-pay | Admitting: General Surgery

## 2013-06-30 ENCOUNTER — Encounter (HOSPITAL_COMMUNITY): Payer: Self-pay | Admitting: General Practice

## 2013-06-30 LAB — CBC
HCT: 32.3 % — ABNORMAL LOW (ref 36.0–46.0)
Hemoglobin: 11.4 g/dL — ABNORMAL LOW (ref 12.0–15.0)
MCH: 32.9 pg (ref 26.0–34.0)
MCHC: 35.3 g/dL (ref 30.0–36.0)
MCV: 93.4 fL (ref 78.0–100.0)
PLATELETS: 198 10*3/uL (ref 150–400)
RBC: 3.46 MIL/uL — AB (ref 3.87–5.11)
RDW: 12 % (ref 11.5–15.5)
WBC: 12.2 10*3/uL — ABNORMAL HIGH (ref 4.0–10.5)

## 2013-06-30 LAB — BASIC METABOLIC PANEL
BUN: 14 mg/dL (ref 6–23)
CALCIUM: 8.5 mg/dL (ref 8.4–10.5)
CO2: 24 meq/L (ref 19–32)
CREATININE: 0.74 mg/dL (ref 0.50–1.10)
Chloride: 101 mEq/L (ref 96–112)
GFR calc Af Amer: >90 mL/min (ref >90)
Glucose, Bld: 162 mg/dL — ABNORMAL HIGH (ref 70–99)
Potassium: 4.7 mEq/L (ref 3.7–5.3)
Sodium: 138 mEq/L (ref 137–147)

## 2013-06-30 MED ORDER — HYDROMORPHONE HCL PF 1 MG/ML IJ SOLN
0.5000 mg | INTRAMUSCULAR | Status: DC | PRN
  Administered 2013-06-30: 1 mg via INTRAVENOUS
  Filled 2013-06-30: qty 1

## 2013-06-30 MED ORDER — OXYCODONE HCL 5 MG PO TABS
5.0000 mg | ORAL_TABLET | ORAL | Status: DC | PRN
  Administered 2013-07-01 (×4): 10 mg via ORAL
  Filled 2013-06-30 (×5): qty 2

## 2013-06-30 NOTE — Evaluation (Signed)
Occupational Therapy Evaluation Patient Details Name: Pamela Underwood MRN: [id number] DOB: [birth year] Today's Date: 06/30/2013 Time: 7510-2585 OT Time Calculation (min): 23 min  OT Assessment / Plan / Recommendation History of present illness Pt. is s/p KL TKA    Clinical Impression   Pt is s/p L TKA. She is currently close supervision for functional transfers (toilet & recliner & functional mobility in room). She is Min guard A LB ADL's. Pt will have PRN family assist at d/c. She plans to sponge bathe initially upon d/c secondary to tub on 2nd fl & will be staying on 1st fl. Pt doing very well, will follow for 1 more acute OT visit if pt is still here tomorrow.    OT Assessment  Patient needs continued OT Services    Follow Up Recommendations  No OT follow up;Supervision - Intermittent    Barriers to Discharge      Equipment Recommendations  None recommended by OT    Recommendations for Other Services    Frequency  Min 2X/week    Precautions / Restrictions Precautions Precautions: Knee Precaution Booklet Issued: Yes (comment) Precaution Comments: Pt. provided with TKA exercise sheet and initial exercises reviewed.  Pt. also instructed in knee precautions Required Braces or Orthoses: Knee Immobilizer - Left Knee Immobilizer - Left: Other (comment) (Until d/c'd) Restrictions Weight Bearing Restrictions: Yes LLE Weight Bearing: Weight bearing as tolerated   Pertinent Vitals/Pain 6/10 L knee pain, RN made aware and pt requested & was given IV pain medication. Repositioned, resting and ice applied at end of session.    ADL  Grooming: Performed;Wash/dry hands;Wash/dry face;Supervision/safety Where Assessed - Grooming: Unsupported standing Upper Body Bathing: Simulated;Set up Where Assessed - Upper Body Bathing: Supported sitting Lower Body Bathing: Simulated;Min guard;Set up Where Assessed - Lower Body Bathing: Supported sit to stand Upper Body Dressing: Simulated;Set  up Where Assessed - Upper Body Dressing: Unsupported sitting Lower Body Dressing: Simulated;Minimal assistance Where Assessed - Lower Body Dressing: Supported sit to stand Toilet Transfer: Chartered loss adjuster Method: Sit to Loss adjuster, chartered: Raised toilet seat with arms (or 3-in-1 over toilet) Toileting - Clothing Manipulation and Hygiene: Performed;Supervision/safety;Min guard Where Assessed - Best boy and Hygiene: Sit to stand from 3-in-1 or toilet Tub/Shower Transfer Method: Not assessed Equipment Used: Knee Immobilizer;Rolling walker Transfers/Ambulation Related to ADLs: Pt overall close supervision level for functional transfers chair to toilet and back using [place], RW.  ADL Comments: Pt was educated in role of OT and participated in ADL retraining session today for toileting, standing at sink for grooming tasks. She was also educated in knee precautions, home safety set-up (remove throw rugs, a/e PRN vs initial family assist PRN). Pt plans to d/c home w/ family assist. Pt is anxious, however, doing very well - moves nicely.    OT Diagnosis: Acute pain  OT Problem List: Decreased knowledge of precautions;Decreased knowledge of use of DME or AE;Pain OT Treatment Interventions: Self-care/ADL training;DME and/or AE instruction;Patient/family education;Therapeutic activities   OT Goals(Current goals can be found in the care plan section) Acute Rehab OT Goals Patient Stated Goal: Home w/ family Time For Goal Achievement: 07/07/13 Potential to Achieve Goals: Good  Visit Information  Last OT Received On: 06/30/13 Assistance Needed: +1 History of Present Illness: Pt. is s/p KL TKA        Prior Functioning     Home Living Family/patient expects to be discharged to:: Private residence Living Arrangements: Spouse/significant other Available Help at Discharge: Family;Available 24 hours/day Type of  Home: House Home Access:  Stairs to enter [employer] of Steps: 1 Entrance Stairs-Rails: None Home Layout: Two level;Able to live on main level with bedroom/bathroom;1/2 bath on main level Home Equipment: Hospital bed;Walker - 2 wheels;Bedside commode Additional Comments: Pt. reports she rented a hospital bed PTA Prior Function Level of Independence: Independent Comments: works as an Therapist, sports in day surgery with [employer] Communication Communication: No difficulties Dominant Hand: Right    Vision/Perception Vision - History Baseline Vision: Wears glasses all the time Patient Visual Report: No change from baseline   Cognition  Cognition Arousal/Alertness: Awake/alert Behavior During Therapy: WFL for tasks assessed/performed;Anxious Overall Cognitive Status: Within Functional Limits for tasks assessed    Extremity/Trunk Assessment Upper Extremity Assessment Upper Extremity Assessment: Overall WFL for tasks assessed Lower Extremity Assessment Lower Extremity Assessment: Defer to PT evaluation LLE Deficits / Details: good quad set and ankle pump; SLR with -10 degree lag Cervical / Trunk Assessment Cervical / Trunk Assessment: Normal    Mobility Bed Mobility Overal bed mobility: Modified Independent General bed mobility comments: increased time for moving to edge of bed needed but no physical assist Transfers Overall transfer level: Needs assistance Equipment used: Rolling walker (2 wheeled) Transfers: Sit to/from Stand Sit to Stand: Supervision;Min guard General transfer comment: cues for hand placement and safe techique to rise to stand and to descend to recliner       Balance Balance Overall balance assessment: Needs assistance Sitting-balance support: Feet supported Sitting balance-Leahy Scale: Normal Standing balance support: No upper extremity supported;During functional activity Standing balance-Leahy Scale: Good Standing balance comment: Standing at sink for grooming tasks/ADL's   End of  Session OT - End of Session Equipment Utilized During Treatment: Rolling walker;Left knee immobilizer Activity Tolerance: Patient tolerated treatment well;Other (comment) (Pt somewhat anxious. ) Patient left: in chair;with call bell/phone within reach Nurse Communication: Mobility status;Other (comment) (Pt requests RN to look at IV) CPM Left Knee CPM Left Knee: Off  GO     Almyra Deforest 06/30/2013, 9:57 AM

## 2013-06-30 NOTE — Care Management Note (Signed)
CARE MANAGEMENT NOTE 06/30/2013  Patient:  Pamela Underwood,Pamela Underwood   Account Number:  [id number]  Date Initiated:  06/30/2013  Documentation initiated by:  Ricki Miller  Subjective/Objective Assessment:   58 yr old female s/p left total knee arthroplasty.     Action/Plan:   Case Manager spoke with patient concerning [place] and DME needs. Choice offered. Referral called to [place].  Patient has rolling walker and 3in1 from previous surgery.   Anticipated DC Date:  07/01/2013   Anticipated DC Plan:  [place]  CM consult      PAC Choice  [place]   Choice offered to / List presented to:  C-1 Patient   DME arranged  [facility]      DME agency  [employer]     HH arranged  HH-2 PT      [place].   Status of service:  Completed, signed off   Discharge Disposition:  [place]

## 2013-06-30 NOTE — Progress Notes (Signed)
Subjective:  1 Day Post-Op Procedure(s) (LRB): TOTAL KNEE ARTHROPLASTY (Left) Patient reports pain as 5 on 0-10 scale.  Patient feels as though she is going to require IV pain meds for the next few days and is wanting to stay until Saturday if possible.  No nausea/vomiting, flatus or bm.  Good appetite.    Objective: Vital signs in last 24 hours: Temp:  [97.7 F (36.5 C)-98.6 F (37 C)] 98.2 F (36.8 C) (02/05 0459) Pulse Rate:  [79-104] 95 (02/05 0459) Resp:  [11-20] 18 (02/05 0459) BP: (102-154)/(53-85) 102/58 mmHg (02/05 0459) SpO2:  [95 %-100 %] 98 % (02/05 0459)  Intake/Output from previous day: 02/04 0701 - 02/05 0700 In: 2100 [I.V.:2050] Out: 875 [Urine:350; Drains:400; Blood:125] Intake/Output this shift: Total I/O In: 240 [P.O.:240] Out: -    Recent Labs  06/27/13 1403 06/30/13 0600  HGB 14.0 11.4*    Recent Labs  06/27/13 1403 06/30/13 0600  WBC 10.7* 12.2*  RBC 4.13 3.46*  HCT 38.5 32.3*  PLT 203 198    Recent Labs  06/27/13 1403 06/30/13 0600  NA 140 138  K 4.1 4.7  CL 101 101  CO2 25 24  BUN 16 14  CREATININE 0.72 0.74  GLUCOSE 93 162*  CALCIUM 9.1 8.5    Recent Labs  06/27/13 1403  INR 0.94    Neurologically intact ABD soft Neurovascular intact Sensation intact distally Intact pulses distally Dorsiflexion/Plantar flexion intact Compartment soft hemovac drain pulled by me today Scant drainage noted through ace bandage  Assessment/Plan: 1 Day Post-Op Procedure(s) (LRB): TOTAL KNEE ARTHROPLASTY (Left) Advance diet Up with therapy D/C IV fluids Probable D/C tomorrow but possibly Saturday  ANTON, M. LINDSEY 06/30/2013, 10:27 AM

## 2013-06-30 NOTE — Progress Notes (Signed)
Utilization review completed.  

## 2013-06-30 NOTE — Discharge Instructions (Signed)
Total Knee Replacement °Care After °Refer to this sheet in the next few weeks. These discharge instructions provide you with general information on caring for yourself after you leave the hospital. Your caregiver may also give you specific instructions. Your treatment has been planned according to the most current medical practices available, but unavoidable complications sometimes occur. If you have any problems or questions after discharge, please call your caregiver. Regaining a near full range of motion of your knee within the first 3 to 6 weeks after surgery is critical. °HOME CARE INSTRUCTIONS  °You may resume a normal diet and activities as directed.  °Perform exercises as directed.  °Place gray foam block, curve side up under heel at all times except when in CPM or when walking.  DO NOT modify, tear, cut, or change in any way the gray foam block. °You will receive physical therapy daily  °Take showers instead of baths until informed otherwise.  You may shower on Sunday.  Please wash whole leg including wound with soap and water  °Change bandages (dressings)daily °It is OK to take over-the-counter tylenol in addition to the oxycodone for pain, discomfort, or fever. °Oxycodone is VERY constipating.  Please take stool softener twice a day and laxatives daily until bowels are regular °Eat a well-balanced diet.  °Avoid lifting or driving until you are instructed otherwise.  °Make an appointment to see your caregiver for stitches (suture) or staple removal as directed.  °If you have been sent home with a continuous passive motion machine (CPM machine), 0-90 degrees 6 hrs a day   2 hrs a shift °SEEK MEDICAL CARE IF: °You have swelling of your calf or leg.  °You develop shortness of breath or chest pain.  °You have redness, swelling, or increasing pain in the wound.  °There is pus or any unusual drainage coming from the surgical site.  °You notice a bad smell coming from the surgical site or dressing.  °The surgical  site breaks open after sutures or staples have been removed.  °There is persistent bleeding from the suture or staple line.  °You are getting worse or are not improving.  °You have any other questions or concerns.  °SEEK IMMEDIATE MEDICAL CARE IF:  °You have a fever.  °You develop a rash.  °You have difficulty breathing.  °You develop any reaction or side effects to medicines given.  °Your knee motion is decreasing rather than improving.  °MAKE SURE YOU:  °Understand these instructions.  °Will watch your condition.  °Will get help right away if you are not doing well or get worse.  ° °Home Health physical therapy to be provided by [facility] [phone] °

## 2013-06-30 NOTE — Plan of Care (Signed)
Problem: Consults Goal: Diagnosis- Total Joint Replacement Primary Total Knee Left     

## 2013-06-30 NOTE — Evaluation (Signed)
Physical Therapy Evaluation Patient Details Name: Pamela Underwood MRN: [id number] DOB: [birth year] Today's Date: 06/30/2013 Time: 9629-5284 PT Time Calculation (min): 45 min  PT Assessment / Plan / Recommendation History of Present Illness  Pt. is s/p KL TKA   Clinical Impression  This patient underwent a left TKA and presents to PT with anticipated post-op decrease in strength and ROM, decreased functional mobility and gait.  Pt. Will benefit from acute PT to address these and below issues.  She progressed well with her mobility today, however was limited to ~ 65 degrees knee flexion in sitting due to pain.  Educated/encouraged pt. To progress flexion as much as she can tolerate early on for best outcome. She is very pleasant but somewhat anxious .  She believes she will need to stay in the hospital till Satruday, however I anticipate she will meet goals by Friday for safe Dc home.       PT Assessment  Patient needs continued PT services    Follow Up Recommendations  Home health PT;Supervision/Assistance - 24 hour    Does the patient have the potential to tolerate intense rehabilitation      Barriers to Discharge        Equipment Recommendations  None recommended by PT (pt. already has in the home)    Recommendations for Other Services     Frequency 7X/week    Precautions / Restrictions Precautions Precautions: Knee Precaution Booklet Issued: Yes (comment) Precaution Comments: Pt. provided with TKA exercise sheet and initial exercises reviewed.  Pt. also instructed in knee precautions Required Braces or Orthoses: Knee Immobilizer - Left Knee Immobilizer - Left: Other (comment) (until [place] per orders) Restrictions Weight Bearing Restrictions: Yes LLE Weight Bearing: Weight bearing as tolerated   Pertinent Vitals/Pain ,see Pt. With limitation in knee flexion due to pain level.      Mobility  Bed Mobility Overal bed mobility: Modified Independent General bed mobility  comments: increased time for moving to edge of bed needed but no physical assist Transfers Overall transfer level: Needs assistance Equipment used: Rolling walker (2 wheeled) Transfers: Sit to/from Stand Sit to Stand: Min assist General transfer comment: cues for hand placement and safe techique to rise to stand and to descend to recliner Ambulation/Gait Ambulation/Gait assistance: Min guard Ambulation Distance (Feet): 40 Feet Assistive device: Rolling walker (2 wheeled) Gait Pattern/deviations: Step-to pattern;Decreased step length - right;Decreased step length - left;Decreased stance time - left;Trunk flexed Gait velocity: decreased    Exercises Total Joint Exercises Ankle Circles/Pumps: AROM;Both;10 reps Quad Sets: AROM;Both;10 reps Straight Leg Raises: AROM;Left;10 reps;Supine (with 10 degree lag) Knee Flexion: AROM;Left;5 reps;Seated Goniometric ROM: ~ -10 to 65   PT Diagnosis: Difficulty walking;Acute pain  PT Problem List: Decreased strength;Decreased range of motion;Decreased activity tolerance;Decreased balance;Decreased mobility;Decreased knowledge of use of DME;Decreased knowledge of precautions;Pain PT Treatment Interventions: DME instruction;Gait training;Stair training;Functional mobility training;Therapeutic activities;Therapeutic exercise;Balance training;Patient/family education     PT Goals(Current goals can be found in the care plan section) Acute Rehab PT Goals Patient Stated Goal: return to work in 4 months PT Goal Formulation: With patient Time For Goal Achievement: 07/07/13 Potential to Achieve Goals: Good  Visit Information  Last PT Received On: 06/30/13 Assistance Needed: +1 History of Present Illness: Pt. is s/p KL TKA        Prior Functioning  Home Living Family/patient expects to be discharged to:: Private residence Living Arrangements: Spouse/significant other;Other relatives Available Help at Discharge: Family;Available 24 hours/day Type of  Home: House Home Access: Stairs  to enter Entrance Stairs-Number of Steps: 1 Entrance Stairs-Rails: None Home Layout: Two level;Able to live on main level with bedroom/bathroom;1/2 bath on main level Home Equipment: Hospital bed;Walker - 2 wheels;Bedside commode;Other (comment) Additional Comments: Pt. reports she rented a hospital bed PTA Prior Function Level of Independence: Independent Comments: works as an Therapist, sports in day surgery with [employer] Communication Communication: No difficulties Dominant Hand: Right    Cognition  Cognition Arousal/Alertness: Awake/alert Behavior During Therapy: WFL for tasks assessed/performed;Anxious (pt. somewhat anxious during session) Overall Cognitive Status: Within Functional Limits for tasks assessed    Extremity/Trunk Assessment Upper Extremity Assessment Upper Extremity Assessment: Overall WFL for tasks assessed Lower Extremity Assessment Lower Extremity Assessment: LLE deficits/detail LLE Deficits / Details: good quad set and ankle pump; SLR with -10 degree lag Cervical / Trunk Assessment Cervical / Trunk Assessment: Normal   Balance Balance Overall balance assessment: Needs assistance Sitting-balance support: Feet supported Sitting balance-Leahy Scale: Normal Standing balance support: Bilateral upper extremity supported;During functional activity Standing balance-Leahy Scale: Good  End of Session PT - End of Session Equipment Utilized During Treatment: Gait belt;Left knee immobilizer Activity Tolerance: Patient tolerated treatment well Patient left: in chair;with call bell/phone within reach Nurse Communication: Mobility status;Patient requests pain meds  GP     Ladona Ridgel 06/30/2013, 9:19 AM Gerlean Ren PT Acute Rehab Services [phone] Beeper [phone]

## 2013-06-30 NOTE — Progress Notes (Signed)
Physical Therapy Treatment Patient Details Name: Pamela Underwood MRN: [id number] DOB: Jul 12, 1955 Today's Date: 06/30/2013 Time: 2947-6546 PT Time Calculation (min): 32 min  PT Assessment / Plan / Recommendation  History of Present Illness Pt. is s/p KL TKA    PT Comments   Pt. Continues with some anxiousness with knowing what to expect and needs step by step by instructions.  Overall she is improving and expect she will be ready for DC home tomorrow following therapies.  Follow Up Recommendations  Home health PT;Supervision/Assistance - 24 hour     Does the patient have the potential to tolerate intense rehabilitation     Barriers to Discharge        Equipment Recommendations  None recommended by PT    Recommendations for Other Services    Frequency 7X/week   Progress towards PT Goals Progress towards PT goals: Progressing toward goals  Plan Current plan remains appropriate    Precautions / Restrictions Precautions Precautions: Knee Required Braces or Orthoses: Knee Immobilizer - Left Knee Immobilizer - Left: Other (comment) (until DC'd per orders) Restrictions Weight Bearing Restrictions: Yes LLE Weight Bearing: Weight bearing as tolerated   Pertinent Vitals/Pain See vitals tab     Mobility  Bed Mobility Overal bed mobility: Modified Independent General bed mobility comments: mod I to get L LE back into bed from seated at EOB Transfers Overall transfer level: Needs assistance Equipment used: Rolling walker (2 wheeled) Transfers: Sit to/from Stand Sit to Stand: Supervision General transfer comment: reminders for hand placement and safe technique Ambulation/Gait Ambulation/Gait assistance: Supervision Ambulation Distance (Feet): 100 Feet Assistive device: Rolling walker (2 wheeled) Gait Pattern/deviations: Step-to pattern General Gait Details: need sequencing cues and supervision for safety    Exercises Total Joint Exercises Ankle Circles/Pumps: AROM;Both;10  reps Quad Sets: AROM;Both;10 reps Short Arc Quad: AROM;Left;10 reps;Supine Hip ABduction/ADduction: AROM;Left;10 reps;Supine Straight Leg Raises: AROM;Left;10 reps;Supine Long Arc Quad: AROM;Left;10 reps;Seated Knee Flexion: AROM;Left;5 reps;Seated Goniometric ROM: -8 to 72   PT Diagnosis:    PT Problem List:   PT Treatment Interventions:     PT Goals (current goals can now be found in the care plan section)    Visit Information  Last PT Received On: 06/30/13 Assistance Needed: +1 History of Present Illness: Pt. is s/p KL TKA     Subjective Data  Subjective: Pt. presents with anxiousness, asking lots of questions   Cognition  Cognition Arousal/Alertness: Awake/alert Behavior During Therapy: [facility] for tasks assessed/performed;Anxious Overall Cognitive Status: Within Functional Limits for tasks assessed    Balance     End of Session PT - End of Session Equipment Utilized During Treatment: Gait belt;Left knee immobilizer Activity Tolerance: Patient tolerated treatment well Patient left: in bed;with call bell/phone within reach Nurse Communication: Mobility status;Patient requests pain meds   GP     Ladona Ridgel 06/30/2013, 4:08 PM Gerlean Ren PT Acute Rehab Services [place] [phone]

## 2013-06-30 NOTE — Op Note (Signed)
NAME:  Pamela Underwood, Pamela Underwood NO.:  [id number]  MEDICAL RECORD NO.:  [id number]  LOCATION:  5N06C                        FACILITY:  [facility]  PHYSICIAN:  [name], M.D. DATE OF BIRTH:  09/07/1955  DATE OF PROCEDURE:  06/29/2013 DATE OF DISCHARGE:                              OPERATIVE REPORT   PREOPERATIVE DIAGNOSIS:  Left knee end-stage degenerative arthritis, varus alignment.  POSTOPERATIVE DIAGNOSIS:  Left knee end-stage degenerative arthritis, varus alignment.  PROCEDURE:  Left knee modified minimally invasive total knee replacement, Stryker triathlon prosthesis.  Soft tissue balancing. Cemented pegged posterior stabilized #3 femoral component.  Cemented #4 tibial component, 9 mm polyethylene insert.  Cemented resurfacing 32-mm patellar component.  SURGEON:  [name], MD  ASSISTANT:  Doran Stabler, PA-C, present throughout the entire case and necessary for timely completion of procedure.  ANESTHESIA:  General.  BLOOD LOSS:  Minimal.  SPECIMENS:  None.  CULTURES:  None.  COMPLICATIONS:  None.  DRESSINGS:  Soft compressive knee immobilizer.  DRAINS:  Hemovac x1.  DESCRIPTION OF PROCEDURE:  The patient was brought to the operating room, placed on the operating table in supine position.  After adequate anesthesia had been obtained, tourniquet applied.  Prepped and draped in usual sterile fashion.  Exsanguinated with elevation of Esmarch. Tourniquet inflated to 350 mmHg.  Mild flexion contracture, varus about 5 fairly good flexion.  Anterior approach with incision above the patella down to the tibial tubercle.  Skin and subcutaneous tissue divided.  Hemostasis with cautery.  Medial arthrotomy, vastus splitting, preserving quad tendon.  Medial capsule release.  Knee exposed. Remnants of menisci, cruciate ligaments, periarticular spurs, loose bodies removed.  Distal femur exposed.  Intramedullary guide placed. Flexible rod.  An 8 mm  resection, 5 degrees of valgus.  Using epicondylar axis, the knee was size cut and fitted for a posterior stabilized pegged #3 component.  Proximal tibial resection extramedullary guide.  A 3-degree posterior slope cut.  Size #4 component.  Debris cleared throughout the knee.  Patella exposed. Posterior 10 mm removed.  Drilled, sized, and fitted for a 32-mm component.  Copious irrigation throughout.  Trials put in place.  With the 9 mm insert, I was very pleased with flexion and extension by mechanical axis, nicely balanced.  Good patellar tracking.  Tibia was marked for rotation and hand reamed.  All trials removed.  I irrigated once again.  Cement prepared, I placed on all components, firmly seated. Polyethylene attached to tibia and knee reduced.  Patella held with clamp.  Once cement hardened, the knee was irrigated again.  Soft tissues injected with Exparel.  Arthrotomy closed with #1 Vicryl.  Skin and subcutaneous tissue closed with a subcutaneous subcuticular closure. Margins were injected with Marcaine.  Sterile compressive dressing applied.  Tourniquet deflated and removed.  Knee immobilizer applied. Anesthesia reversed.  Brought to the recovery room.  Tolerated the surgery well with no complications.     [name], M.D.     DFM/MEDQ  D:  06/29/2013  T:  06/30/2013  Job:  [id number]

## 2013-07-01 ENCOUNTER — Encounter (HOSPITAL_COMMUNITY): Payer: Self-pay | Admitting: Orthopedic Surgery

## 2013-07-01 LAB — CBC
HCT: 30.2 % — ABNORMAL LOW (ref 36.0–46.0)
Hemoglobin: 10.7 g/dL — ABNORMAL LOW (ref 12.0–15.0)
MCH: 33.4 pg (ref 26.0–34.0)
MCHC: 35.4 g/dL (ref 30.0–36.0)
MCV: 94.4 fL (ref 78.0–100.0)
PLATELETS: 186 10*3/uL (ref 150–400)
RBC: 3.2 MIL/uL — ABNORMAL LOW (ref 3.87–5.11)
RDW: 12.2 % (ref 11.5–15.5)
WBC: 17.9 10*3/uL — AB (ref 4.0–10.5)

## 2013-07-01 LAB — BASIC METABOLIC PANEL
BUN: 14 mg/dL (ref 6–23)
CHLORIDE: 106 meq/L (ref 96–112)
CO2: 24 mEq/L (ref 19–32)
CREATININE: 0.68 mg/dL (ref 0.50–1.10)
Calcium: 8.8 mg/dL (ref 8.4–10.5)
GFR calc non Af Amer: >90 mL/min (ref >90)
Glucose, Bld: 148 mg/dL — ABNORMAL HIGH (ref 70–99)
Potassium: 4.4 mEq/L (ref 3.7–5.3)
SODIUM: 143 meq/L (ref 137–147)

## 2013-07-01 MED ORDER — OXYCODONE HCL ER 10 MG PO T12A: 10.0000 mg | EXTENDED_RELEASE_TABLET | Freq: Two times a day (BID) | ORAL | Status: DC

## 2013-07-01 MED ORDER — OXYCODONE-ACETAMINOPHEN 5-325 MG PO TABS: 1.0000 | ORAL_TABLET | ORAL | Status: DC | PRN

## 2013-07-01 NOTE — Progress Notes (Signed)
Physical Therapy Treatment Patient Details Name: Pamela Underwood MRN: [id number] DOB: March 14, 1956 Today's Date: 07/01/2013 Time: 4098-1191 PT Time Calculation (min): 48 min  PT Assessment / Plan / Recommendation  History of Present Illness Pt. is s/p Lt TKA    PT Comments   Patient making good gains with PT.  Able to negotiate steps with min assist.  Follow Up Recommendations  Home health PT;Supervision/Assistance - 24 hour     Does the patient have the potential to tolerate intense rehabilitation     Barriers to Discharge        Equipment Recommendations  None recommended by PT    Recommendations for Other Services    Frequency 7X/week   Progress towards PT Goals Progress towards PT goals: Progressing toward goals  Plan Current plan remains appropriate    Precautions / Restrictions Precautions Precautions: Knee Precaution Comments: Reviewed precautions Required Braces or Orthoses: Knee Immobilizer - Left Knee Immobilizer - Left: On except when in CPM;On when out of bed or walking Restrictions Weight Bearing Restrictions: Yes LLE Weight Bearing: Weight bearing as tolerated   Pertinent Vitals/Pain     Mobility  Bed Mobility Overal bed mobility: Modified Independent General bed mobility comments: Patient able to don KI on LLE with min assist.  Able to get to EOB using bed rail.  Good balance in sitting. Transfers Overall transfer level: Needs assistance Equipment used: Rolling walker (2 wheeled) Transfers: Sit to/from Stand Sit to Stand: Supervision General transfer comment: reminders for hand placement and safe technique Ambulation/Gait Ambulation/Gait assistance: Supervision Ambulation Distance (Feet): 180 Feet Assistive device: Rolling walker (2 wheeled) Gait Pattern/deviations: Step-to pattern;Decreased stance time - left;Decreased step length - right;Antalgic Gait velocity: decreased Gait velocity interpretation: Below normal speed for age/gender General Gait  Details: Patient able to recall and demonstrate correct sequencing with RW.  Cues to stand upright.   Stairs: Yes Stairs assistance: Min assist Stair Management: No rails;Step to pattern;Forwards Number of Stairs: 3 General stair comments: Instructed patient to negotiate steps using hand hold assist and step-to sequence.  Patient able to perform with min assist.    Exercises Total Joint Exercises Ankle Circles/Pumps: AROM;Both;10 reps;Seated Quad Sets: AROM;Both;10 reps;Seated Short Arc Quad: AROM;Left;10 reps;Seated Heel Slides: AROM;Left;10 reps;Seated Hip ABduction/ADduction: AROM;Left;10 reps;Seated Long Arc Quad: AROM;Left;10 reps;Seated Knee Flexion: AROM;Left;10 reps;Seated     PT Goals (current goals can now be found in the care plan section)    Visit Information  Last PT Received On: 07/01/13 Assistance Needed: +1 History of Present Illness: Pt. is s/p KL TKA     Subjective Data  Subjective: Asking questions about gait sequence, etc.  "Do you think I'm OK to go home"  Patient apprehensive   Cognition  Cognition Arousal/Alertness: Awake/alert Behavior During Therapy: [facility] for tasks assessed/performed;Anxious Overall Cognitive Status: Within Functional Limits for tasks assessed    Balance     End of Session PT - End of Session Equipment Utilized During Treatment: Gait belt;Left knee immobilizer Activity Tolerance: Patient tolerated treatment well Patient left: in chair;with call bell/phone within reach Nurse Communication: Mobility status CPM Left Knee CPM Left Knee: Off   GP     Despina Pole 07/01/2013, 1:40 PM Carita Pian. Sanjuana Kava, [place] Pager [phone]

## 2013-07-01 NOTE — Progress Notes (Signed)
Subjective: 2 Days Post-Op Procedure(s) (LRB): TOTAL KNEE ARTHROPLASTY (Left) Patient reports pain as 6 on 0-10 scale.  Patient still complains of pain and is anxious about being discharged home today.  She has been doing well with PT.  No nausea/vomiting.  Positive flatus but no bm as of yet.  Good appetite.  Objective: Vital signs in last 24 hours: Temp:  [98.2 F (36.8 C)-99.2 F (37.3 C)] 98.4 F (36.9 C) (02/06 0526) Pulse Rate:  [74-87] 81 (02/06 0526) Resp:  [18] 18 (02/06 0526) BP: (113-125)/(54-55) 125/55 mmHg (02/06 0526) SpO2:  [96 %-99 %] 97 % (02/06 0526)  Intake/Output from previous day: 02/05 0701 - 02/06 0700 In: 2331.7 [P.O.:960; I.V.:1371.7] Out: -  Intake/Output this shift:     Recent Labs  06/30/13 0600 07/01/13 0355  HGB 11.4* 10.7*    Recent Labs  06/30/13 0600 07/01/13 0355  WBC 12.2* 17.9*  RBC 3.46* 3.20*  HCT 32.3* 30.2*  PLT 198 186    Recent Labs  06/30/13 0600 07/01/13 0355  NA 138 143  K 4.7 4.4  CL 101 106  CO2 24 24  BUN 14 14  CREATININE 0.74 0.68  GLUCOSE 162* 148*  CALCIUM 8.5 8.8   No results found for this basename: LABPT, INR,  in the last 72 hours  Neurologically intact ABD soft Neurovascular intact Sensation intact distally Intact pulses distally Dorsiflexion/Plantar flexion intact Incision: scant drainage No cellulitis present Compartment soft Dressing changed by me today  Assessment/Plan: 2 Days Post-Op Procedure(s) (LRB): TOTAL KNEE ARTHROPLASTY (Left) Advance diet Up with therapy Discharge home with home health most likely today.   Possibly tomorrow due to pain control.   Larae Grooms 07/01/2013, 7:33 AM

## 2013-07-01 NOTE — Progress Notes (Signed)
Came to visit patient on behalf of Link to [employer] program for [employer] employees/dependents with [employer]. States she does not have any current Link to Wellness needs. Left brochure and contact information at bedside. However, appreciative of post discharge call.  Marthenia Rolling, MSN, RN,BSN- [facility] [phone]

## 2013-07-01 NOTE — Progress Notes (Signed)
Patient provided with discharge instructions and follow up information. She is going home with HHPT through [place]. She has her equipment and will be transported home in private vehicle with her husband and sister in law for support.

## 2013-07-01 NOTE — Progress Notes (Addendum)
Physical Therapy Progress Note  07/01/13 1514  PT Visit Information  Last PT Received On 07/01/13  Assistance Needed +1  History of Present Illness Pt. is s/p KL TKA   PT Time Calculation  PT Start Time 1500  PT Stop Time 1509  PT Time Calculation (min) 9 min  Subjective Data  Subjective Patient reports she wants husband to practice assisting on stairs.  Precautions  Precautions Knee  Required Braces or Orthoses Knee Immobilizer - Left  Knee Immobilizer - Left On except when in CPM;On when out of bed or walking  Restrictions  Weight Bearing Restrictions Yes  LLE Weight Bearing WBAT  Cognition  Arousal/Alertness Awake/alert  Behavior During Therapy WFL for tasks assessed/performed;Anxious  Overall Cognitive Status Within Functional Limits for tasks assessed  Ambulation/Gait  Stairs Yes  Stairs assistance Min assist  Stair Management No rails;Step to pattern;Forwards  Number of Stairs 3  General stair comments Instructed husband on proper guarding technique when assisting wife on stairs.  Instructed husband on proper sequence for negotiating stairs without rail with hand-hold assist.  Patient able to perform.  PT - End of Session  Patient left in chair  PT - Assessment/Plan  PT Plan Current plan remains appropriate  PT Frequency 7X/week  Follow Up Recommendations Home health PT;Supervision/Assistance - 24 hour  PT equipment None recommended by PT  PT Goal Progression  Progress towards PT goals Progressing toward goals  PT General Charges  $$ ACUTE PT VISIT 1 Procedure  PT Treatments  $Self Care/Home Management 8-22  Carita Pian. Sanjuana Kava, [place] Pager [phone]

## 2013-07-01 NOTE — Progress Notes (Signed)
Pt states pain is not currently being controlled with current medication regimen.  On call PA Ainsley Spinner) contacted and new orders received.  Pt also has anxiety about pain control once discharged to home.  Will continue to monitor patient and educate patient as needed.

## 2013-07-26 ENCOUNTER — Other Ambulatory Visit: Payer: Self-pay | Admitting: Cardiology

## 2013-07-26 MED ORDER — PROPAFENONE HCL ER 225 MG PO CP12: 225.0000 mg | ORAL_CAPSULE | Freq: Two times a day (BID) | ORAL | Status: DC

## 2013-07-26 NOTE — Telephone Encounter (Signed)
Refilled

## 2013-08-08 ENCOUNTER — Ambulatory Visit: Payer: 59 | Admitting: Interventional Cardiology

## 2013-08-16 ENCOUNTER — Ambulatory Visit: Payer: 59 | Admitting: Interventional Cardiology

## 2013-09-14 ENCOUNTER — Other Ambulatory Visit: Payer: Self-pay | Admitting: Interventional Cardiology

## 2013-09-21 ENCOUNTER — Ambulatory Visit: Payer: 59 | Admitting: Interventional Cardiology

## 2013-10-02 ENCOUNTER — Other Ambulatory Visit: Payer: Self-pay | Admitting: Cardiology

## 2013-10-02 DIAGNOSIS — I4891 Unspecified atrial fibrillation: Secondary | ICD-10-CM

## 2013-10-02 MED ORDER — PROPAFENONE HCL ER 225 MG PO CP12: 225.0000 mg | ORAL_CAPSULE | Freq: Two times a day (BID) | ORAL | Status: DC

## 2013-10-04 ENCOUNTER — Other Ambulatory Visit: Payer: Self-pay | Admitting: Interventional Cardiology

## 2013-11-03 ENCOUNTER — Other Ambulatory Visit: Payer: Self-pay | Admitting: Interventional Cardiology

## 2013-11-16 ENCOUNTER — Other Ambulatory Visit: Payer: Self-pay | Admitting: Interventional Cardiology

## 2013-11-21 ENCOUNTER — Encounter: Payer: Self-pay | Admitting: Interventional Cardiology

## 2013-11-21 ENCOUNTER — Ambulatory Visit (INDEPENDENT_AMBULATORY_CARE_PROVIDER_SITE_OTHER): Payer: 59 | Admitting: Interventional Cardiology

## 2013-11-21 VITALS — BP 150/90 | HR 87 | Ht 67.5 in | Wt 187.0 lb

## 2013-11-21 DIAGNOSIS — R5382 Chronic fatigue, unspecified: Secondary | ICD-10-CM

## 2013-11-21 DIAGNOSIS — Z13220 Encounter for screening for lipoid disorders: Secondary | ICD-10-CM

## 2013-11-21 DIAGNOSIS — R5383 Other fatigue: Secondary | ICD-10-CM

## 2013-11-21 DIAGNOSIS — I471 Supraventricular tachycardia: Secondary | ICD-10-CM

## 2013-11-21 DIAGNOSIS — R5381 Other malaise: Secondary | ICD-10-CM

## 2013-11-21 NOTE — Progress Notes (Signed)
Patient ID: Pamela Underwood, female   DOB: 1955/07/14, 58 y.o.   MRN: [id number]    [address], [address] [place], [place]  [address] Phone: [phone] Fax:  [phone]  Date:  11/21/2013   ID:  Pamela Underwood, DOB 05-15-1956, MRN [id number]  PCP:  Wenda Low, MD      History of Present Illness: Pamela Underwood is a 58 y.o. female who has had SVT. Controlled on propafenone. Has declined evaluation for ablation in the past. Can feel premature beats as well at times. She reports some fatigue. She was concerned that it is the beta blocker and it was stopped.   She is now using CPAP.  She had a TKR several months ago.  She had a lot of pain after that.  This is improved.    Wt Readings from Last 3 Encounters:  11/21/13 187 lb (84.823 kg)  06/27/13 187 lb 9.6 oz (85.095 kg)  05/09/13 189 lb (85.73 kg)     Past Medical History  Diagnosis Date  . Insomnia     takes Ambien nightly as needed  . Atrial tachycardia     takes rythmol daily and cardiazem daily  . Asthma     as a child  . Sleep apnea     uses a CPAP  . Arthritis   . Joint pain   . Joint swelling   . Back pain     occasionally  . Colitis     hx-one  . Chronic anxiety     takes Xanax daily as needed when heart rate going up    Current Outpatient Prescriptions  Medication Sig Dispense Refill  . ALPRAZolam (XANAX) 0.5 MG tablet Take 0.5 mg by mouth at bedtime as needed. For stress/anxiety      . aspirin 81 MG tablet Take 81 mg by mouth daily.      Marland Kitchen diltiazem (CARDIZEM CD) 120 MG 24 hr capsule TAKE 1 CAPSULE BY MOUTH ONCE DAILY  30 capsule  1  . propafenone (RYTHMOL SR) 225 MG 12 hr capsule TAKE 1 CAPSULE BY MOUTH EVERY 12 HOURS  60 capsule  2  . VITAMIN D, CHOLECALCIFEROL, PO Take 1 tablet by mouth daily.       Marland Kitchen zolpidem (AMBIEN) 10 MG tablet Take 5-10 mg by mouth at bedtime as needed. For sleep        No current facility-administered medications for this visit.    Allergies:    Allergies    Allergen Reactions  . Escitalopram Oxalate Other (See Comments)    Hives, welts, all over skin rash    Social History:  The patient  reports that she has never smoked. She has never used smokeless tobacco. She reports that she drinks alcohol. She reports that she does not use illicit drugs.   Family History:  The patient's family history includes Heart disease in her father.   ROS:  Please see the history of present illness.  No nausea, vomiting.  No fevers, chills.  No focal weakness.  No dysuria.   All other systems reviewed and negative.   PHYSICAL EXAM: VS:  BP 150/90  Pulse 87  Ht 5' 7.5" (1.715 m)  Wt 187 lb (84.823 kg)  BMI 28.84 kg/m2 Well nourished, well developed, in no acute distress HEENT: normal Neck: no JVD, no carotid bruits Cardiac:  normal S1, S2; RRR;  Lungs:  clear to auscultation bilaterally, no wheezing, rhonchi or rales Abd: soft, nontender, no hepatomegaly Ext: no edema Skin:  warm and dry Neuro:   no focal abnormalities noted  EKG:   NSR, NSST, QT interval less than 1/2 RR interval  ASSESSMENT AND PLAN:  Paroxysmal supraventricular tachycardia  Continue Rythmol SR Capsule Extended Release 12 Hour, 225 MG, 1 capsule, Orally, every 12 hrs IMAGING: EKG   Harward,Amy 07/27/2012 03:20:24 PM > VARANASI,JAY 07/27/2012 03:44:37 PM > NSR, NSST   Notes: Rhythm controlled.  No SVT at the time of knee replacement in 2/15. 2. Fatigue  Continue Cardizem CD Capsule Extended Release 24 Hour, 120 MG, 1 capsule, Orally, Once a day, 30 day(s), 30, Refills 11 Notes: Better with CPAP.   3. Screening for hypercholesterolemia  Due for Lipid Panel  Notes: LDL 155 in 2011. Target is <160.     Signed, Mina Marble, MD, [facility] 11/21/2013 3:32 PM

## 2013-11-21 NOTE — Patient Instructions (Signed)
Your physician recommends that you continue on your current medications as directed. Please refer to the Current Medication list given to you today.  Your physician recommends that you return for a FASTING lipid profile:  Your physician wants you to follow-up in: 1 year with Dr. Irish Lack. You will receive a reminder letter in the mail two months in advance. If you don't receive a letter, please call our office to schedule the follow-up appointment.

## 2014-02-01 ENCOUNTER — Other Ambulatory Visit: Payer: Self-pay | Admitting: Interventional Cardiology

## 2014-02-07 ENCOUNTER — Telehealth (INDEPENDENT_AMBULATORY_CARE_PROVIDER_SITE_OTHER): Payer: Self-pay

## 2014-02-07 ENCOUNTER — Other Ambulatory Visit (HOSPITAL_COMMUNITY): Payer: Self-pay | Admitting: Radiology

## 2014-02-07 DIAGNOSIS — C50911 Malignant neoplasm of unspecified site of right female breast: Secondary | ICD-10-CM

## 2014-02-07 MED ORDER — DIAZEPAM 5 MG PO TABS: ORAL_TABLET | ORAL | Status: DC

## 2014-02-07 NOTE — Telephone Encounter (Signed)
Tammy,RN calling to request a written rx for pt to take before the scheduled MRI. Ok per Dr Donne Hazel to order Valium 5mg  #1 take 1 tablet 78min. Before the procedure signed by Dr Johney Maine. Rx ready at front desk for p/u.

## 2014-02-08 ENCOUNTER — Ambulatory Visit (HOSPITAL_COMMUNITY)
Admission: RE | Admit: 2014-02-08 | Discharge: 2014-02-08 | Disposition: A | Discharge status: Home / Self Care | Payer: 59 | Source: Ambulatory Visit | Attending: Radiology | Admitting: Radiology

## 2014-02-08 DIAGNOSIS — C50919 Malignant neoplasm of unspecified site of unspecified female breast: Secondary | ICD-10-CM | POA: Diagnosis not present

## 2014-02-08 DIAGNOSIS — C50911 Malignant neoplasm of unspecified site of right female breast: Secondary | ICD-10-CM

## 2014-02-08 MED ORDER — GADOBENATE DIMEGLUMINE 529 MG/ML IV SOLN
17.0000 mL | Freq: Once | INTRAVENOUS | Status: AC | PRN
  Administered 2014-02-08: 17 mL via INTRAVENOUS

## 2014-02-14 ENCOUNTER — Other Ambulatory Visit: Payer: Self-pay | Admitting: Interventional Cardiology

## 2014-02-15 ENCOUNTER — Ambulatory Visit (HOSPITAL_COMMUNITY): Payer: 59

## 2014-02-17 ENCOUNTER — Encounter (HOSPITAL_BASED_OUTPATIENT_CLINIC_OR_DEPARTMENT_OTHER): Payer: Self-pay | Admitting: *Deleted

## 2014-02-17 NOTE — Progress Notes (Addendum)
Bring all medications. Plans to get BMET on Monday. Dr. Al Corpus reviewed history - Ok for surgery.

## 2014-02-20 ENCOUNTER — Encounter (HOSPITAL_BASED_OUTPATIENT_CLINIC_OR_DEPARTMENT_OTHER)
Admission: RE | Admit: 2014-02-20 | Discharge: 2014-02-20 | Disposition: A | Discharge status: Home / Self Care | Payer: 59 | Source: Ambulatory Visit | Attending: General Surgery | Admitting: General Surgery

## 2014-02-20 DIAGNOSIS — G4730 Sleep apnea, unspecified: Secondary | ICD-10-CM | POA: Diagnosis not present

## 2014-02-20 DIAGNOSIS — Z888 Allergy status to other drugs, medicaments and biological substances status: Secondary | ICD-10-CM | POA: Diagnosis not present

## 2014-02-20 DIAGNOSIS — C50919 Malignant neoplasm of unspecified site of unspecified female breast: Secondary | ICD-10-CM | POA: Diagnosis present

## 2014-02-20 LAB — BASIC METABOLIC PANEL
Anion gap: 11 (ref 5–15)
BUN: 14 mg/dL (ref 6–23)
CALCIUM: 9.5 mg/dL (ref 8.4–10.5)
CO2: 28 mEq/L (ref 19–32)
Chloride: 101 mEq/L (ref 96–112)
Creatinine, Ser: 0.72 mg/dL (ref 0.50–1.10)
GFR calc Af Amer: >90 mL/min (ref >90)
Glucose, Bld: 104 mg/dL — ABNORMAL HIGH (ref 70–99)
Potassium: 4.8 mEq/L (ref 3.7–5.3)
Sodium: 140 mEq/L (ref 137–147)

## 2014-02-21 ENCOUNTER — Encounter (HOSPITAL_BASED_OUTPATIENT_CLINIC_OR_DEPARTMENT_OTHER): Payer: Self-pay | Admitting: *Deleted

## 2014-02-21 ENCOUNTER — Other Ambulatory Visit (INDEPENDENT_AMBULATORY_CARE_PROVIDER_SITE_OTHER): Payer: Self-pay | Admitting: General Surgery

## 2014-02-21 DIAGNOSIS — C50411 Malignant neoplasm of upper-outer quadrant of right female breast: Secondary | ICD-10-CM

## 2014-02-22 ENCOUNTER — Encounter (HOSPITAL_BASED_OUTPATIENT_CLINIC_OR_DEPARTMENT_OTHER): Payer: Self-pay

## 2014-02-22 ENCOUNTER — Ambulatory Visit (HOSPITAL_BASED_OUTPATIENT_CLINIC_OR_DEPARTMENT_OTHER): Payer: 59 | Admitting: Anesthesiology

## 2014-02-22 ENCOUNTER — Ambulatory Visit (HOSPITAL_COMMUNITY)
Admission: RE | Admit: 2014-02-22 | Discharge: 2014-02-22 | Disposition: A | Discharge status: Home / Self Care | Payer: 59 | Source: Ambulatory Visit | Attending: General Surgery | Admitting: General Surgery

## 2014-02-22 ENCOUNTER — Ambulatory Visit (HOSPITAL_BASED_OUTPATIENT_CLINIC_OR_DEPARTMENT_OTHER)
Admission: RE | Admit: 2014-02-22 | Discharge: 2014-02-22 | Disposition: A | Discharge status: Home / Self Care | Payer: 59 | Source: Ambulatory Visit | Attending: General Surgery | Admitting: General Surgery

## 2014-02-22 ENCOUNTER — Encounter (HOSPITAL_BASED_OUTPATIENT_CLINIC_OR_DEPARTMENT_OTHER)
Admission: RE | Disposition: A | Discharge status: Home / Self Care | Payer: Self-pay | Source: Ambulatory Visit | Attending: General Surgery

## 2014-02-22 ENCOUNTER — Encounter (HOSPITAL_BASED_OUTPATIENT_CLINIC_OR_DEPARTMENT_OTHER): Payer: 59 | Admitting: Anesthesiology

## 2014-02-22 DIAGNOSIS — C50919 Malignant neoplasm of unspecified site of unspecified female breast: Secondary | ICD-10-CM

## 2014-02-22 DIAGNOSIS — C50411 Malignant neoplasm of upper-outer quadrant of right female breast: Secondary | ICD-10-CM

## 2014-02-22 DIAGNOSIS — G4730 Sleep apnea, unspecified: Secondary | ICD-10-CM | POA: Insufficient documentation

## 2014-02-22 DIAGNOSIS — Z888 Allergy status to other drugs, medicaments and biological substances status: Secondary | ICD-10-CM | POA: Insufficient documentation

## 2014-02-22 HISTORY — PX: BREAST LUMPECTOMY WITH AXILLARY LYMPH NODE BIOPSY: SHX5593

## 2014-02-22 HISTORY — DX: Malignant neoplasm of unspecified site of unspecified female breast: C50.919

## 2014-02-22 SURGERY — RADIOACTIVE SEED GUIDED PARTIAL MASTECTOMY WITH AXILLARY SENTINEL LYMPH NODE BIOPSY
Anesthesia: General | Site: Breast | Laterality: Right

## 2014-02-22 MED ORDER — MIDAZOLAM HCL 2 MG/2ML IJ SOLN
INTRAMUSCULAR | Status: AC
  Filled 2014-02-22: qty 2

## 2014-02-22 MED ORDER — HYDROMORPHONE HCL 1 MG/ML IJ SOLN
0.2500 mg | INTRAMUSCULAR | Status: DC | PRN
  Administered 2014-02-22 (×2): 0.5 mg via INTRAVENOUS

## 2014-02-22 MED ORDER — FENTANYL CITRATE 0.05 MG/ML IJ SOLN
50.0000 ug | INTRAMUSCULAR | Status: DC | PRN
  Administered 2014-02-22 (×2): 100 ug via INTRAVENOUS

## 2014-02-22 MED ORDER — PROMETHAZINE HCL 25 MG/ML IJ SOLN: 6.2500 mg | INTRAMUSCULAR | Status: DC | PRN

## 2014-02-22 MED ORDER — 0.9 % SODIUM CHLORIDE (POUR BTL) OPTIME
TOPICAL | Status: DC | PRN
  Administered 2014-02-22: 1000 mL

## 2014-02-22 MED ORDER — HYDROMORPHONE HCL 1 MG/ML IJ SOLN
INTRAMUSCULAR | Status: AC
  Filled 2014-02-22: qty 1

## 2014-02-22 MED ORDER — PROPOFOL 10 MG/ML IV BOLUS
INTRAVENOUS | Status: DC | PRN
  Administered 2014-02-22: 160 mg via INTRAVENOUS

## 2014-02-22 MED ORDER — FENTANYL CITRATE 0.05 MG/ML IJ SOLN
INTRAMUSCULAR | Status: AC
  Filled 2014-02-22: qty 2

## 2014-02-22 MED ORDER — OXYCODONE HCL 5 MG/5ML PO SOLN: 5.0000 mg | Freq: Once | ORAL | Status: AC | PRN

## 2014-02-22 MED ORDER — LACTATED RINGERS IV SOLN
INTRAVENOUS | Status: DC
  Administered 2014-02-22: 12:00:00 via INTRAVENOUS

## 2014-02-22 MED ORDER — ONDANSETRON HCL 4 MG/2ML IJ SOLN
INTRAMUSCULAR | Status: DC | PRN
  Administered 2014-02-22: 4 mg via INTRAVENOUS

## 2014-02-22 MED ORDER — BUPIVACAINE HCL (PF) 0.25 % IJ SOLN
INTRAMUSCULAR | Status: AC
  Filled 2014-02-22: qty 30

## 2014-02-22 MED ORDER — TECHNETIUM TC 99M SULFUR COLLOID FILTERED
1.0000 | Freq: Once | INTRAVENOUS | Status: AC | PRN
  Administered 2014-02-22: 1 via INTRADERMAL

## 2014-02-22 MED ORDER — OXYCODONE HCL 5 MG PO TABS
5.0000 mg | ORAL_TABLET | Freq: Once | ORAL | Status: AC | PRN
  Administered 2014-02-22: 5 mg via ORAL

## 2014-02-22 MED ORDER — OXYCODONE-ACETAMINOPHEN 10-325 MG PO TABS: 1.0000 | ORAL_TABLET | Freq: Four times a day (QID) | ORAL | Status: DC | PRN

## 2014-02-22 MED ORDER — BUPIVACAINE HCL (PF) 0.25 % IJ SOLN: INTRAMUSCULAR | Status: DC | PRN

## 2014-02-22 MED ORDER — METHYLENE BLUE 1 % INJ SOLN
INTRAMUSCULAR | Status: AC
  Filled 2014-02-22: qty 10

## 2014-02-22 MED ORDER — MIDAZOLAM HCL 2 MG/2ML IJ SOLN
1.0000 mg | INTRAMUSCULAR | Status: DC | PRN
  Administered 2014-02-22 (×2): 2 mg via INTRAVENOUS

## 2014-02-22 MED ORDER — MIDAZOLAM HCL 5 MG/5ML IJ SOLN
INTRAMUSCULAR | Status: DC | PRN
  Administered 2014-02-22: 2 mg via INTRAVENOUS

## 2014-02-22 MED ORDER — FENTANYL CITRATE 0.05 MG/ML IJ SOLN
INTRAMUSCULAR | Status: DC | PRN
  Administered 2014-02-22: 100 ug via INTRAVENOUS

## 2014-02-22 MED ORDER — BUPIVACAINE-EPINEPHRINE (PF) 0.5% -1:200000 IJ SOLN
INTRAMUSCULAR | Status: DC | PRN
  Administered 2014-02-22: 30 mL

## 2014-02-22 MED ORDER — CEFAZOLIN SODIUM-DEXTROSE 2-3 GM-% IV SOLR
INTRAVENOUS | Status: DC | PRN
  Administered 2014-02-22: 2 g via INTRAVENOUS

## 2014-02-22 MED ORDER — ACETAMINOPHEN 500 MG PO TABS
1000.0000 mg | ORAL_TABLET | Freq: Once | ORAL | Status: AC
Start: 2014-02-22 — End: 2014-02-22
  Administered 2014-02-22: 1000 mg via ORAL

## 2014-02-22 MED ORDER — LIDOCAINE HCL (CARDIAC) 20 MG/ML IV SOLN
INTRAVENOUS | Status: DC | PRN
  Administered 2014-02-22: 80 mg via INTRAVENOUS

## 2014-02-22 MED ORDER — SODIUM CHLORIDE 0.9 % IJ SOLN
INTRAMUSCULAR | Status: AC
  Filled 2014-02-22: qty 10

## 2014-02-22 MED ORDER — ACETAMINOPHEN 500 MG PO TABS
ORAL_TABLET | ORAL | Status: AC
  Filled 2014-02-22: qty 2

## 2014-02-22 MED ORDER — FENTANYL CITRATE 0.05 MG/ML IJ SOLN
INTRAMUSCULAR | Status: AC
  Filled 2014-02-22: qty 6

## 2014-02-22 MED ORDER — CEFAZOLIN SODIUM-DEXTROSE 2-3 GM-% IV SOLR
INTRAVENOUS | Status: AC
  Filled 2014-02-22: qty 50

## 2014-02-22 MED ORDER — OXYCODONE HCL 5 MG PO TABS
ORAL_TABLET | ORAL | Status: AC
  Filled 2014-02-22: qty 1

## 2014-02-22 MED ORDER — DEXAMETHASONE SODIUM PHOSPHATE 4 MG/ML IJ SOLN
INTRAMUSCULAR | Status: DC | PRN
  Administered 2014-02-22: 10 mg via INTRAVENOUS

## 2014-02-22 SURGICAL SUPPLY — 63 items
APPLIER CLIP 9.375 MED OPEN (MISCELLANEOUS) ×3
BENZOIN TINCTURE PRP APPL 2/3 (GAUZE/BANDAGES/DRESSINGS) ×3 IMPLANT
BINDER BREAST LRG (GAUZE/BANDAGES/DRESSINGS) IMPLANT
BINDER BREAST MEDIUM (GAUZE/BANDAGES/DRESSINGS) IMPLANT
BINDER BREAST XLRG (GAUZE/BANDAGES/DRESSINGS) ×3 IMPLANT
BINDER BREAST XXLRG (GAUZE/BANDAGES/DRESSINGS) IMPLANT
BLADE SURG 15 STRL LF DISP TIS (BLADE) ×1 IMPLANT
BLADE SURG 15 STRL SS (BLADE) ×2
CANISTER SUC SOCK COL 7IN (MISCELLANEOUS) IMPLANT
CANISTER SUCT 1200ML W/VALVE (MISCELLANEOUS) ×3 IMPLANT
CHLORAPREP W/TINT 26ML (MISCELLANEOUS) ×3 IMPLANT
CLIP APPLIE 9.375 MED OPEN (MISCELLANEOUS) ×1 IMPLANT
CLOSURE WOUND 1/2 X4 (GAUZE/BANDAGES/DRESSINGS) ×1
COVER MAYO STAND STRL (DRAPES) ×3 IMPLANT
COVER PROBE W GEL 5X96 (DRAPES) ×3 IMPLANT
COVER TABLE BACK 60X90 (DRAPES) ×3 IMPLANT
DECANTER SPIKE VIAL GLASS SM (MISCELLANEOUS) IMPLANT
DERMABOND ADVANCED (GAUZE/BANDAGES/DRESSINGS) ×2
DERMABOND ADVANCED .7 DNX12 (GAUZE/BANDAGES/DRESSINGS) ×1 IMPLANT
DEVICE DUBIN W/COMP PLATE 8390 (MISCELLANEOUS) ×6 IMPLANT
DRAPE LAPAROSCOPIC ABDOMINAL (DRAPES) ×3 IMPLANT
DRSG TEGADERM 4X4.75 (GAUZE/BANDAGES/DRESSINGS) ×9 IMPLANT
ELECT COATED BLADE 2.86 ST (ELECTRODE) ×3 IMPLANT
ELECT REM PT RETURN 9FT ADLT (ELECTROSURGICAL) ×3
ELECTRODE REM PT RTRN 9FT ADLT (ELECTROSURGICAL) ×1 IMPLANT
GLOVE BIO SURGEON STRL SZ7 (GLOVE) ×6 IMPLANT
GLOVE BIOGEL PI IND STRL 7.0 (GLOVE) ×1 IMPLANT
GLOVE BIOGEL PI IND STRL 7.5 (GLOVE) ×1 IMPLANT
GLOVE BIOGEL PI INDICATOR 7.0 (GLOVE) ×2
GLOVE BIOGEL PI INDICATOR 7.5 (GLOVE) ×2
GLOVE ECLIPSE 6.5 STRL STRAW (GLOVE) ×3 IMPLANT
GOWN STRL REUS W/ TWL LRG LVL3 (GOWN DISPOSABLE) ×2 IMPLANT
GOWN STRL REUS W/TWL LRG LVL3 (GOWN DISPOSABLE) ×4
KIT MARKER MARGIN INK (KITS) ×3 IMPLANT
MARKER SKIN DUAL TIP RULER LAB (MISCELLANEOUS) ×3 IMPLANT
NDL SAFETY ECLIPSE 18X1.5 (NEEDLE) IMPLANT
NEEDLE HYPO 18GX1.5 SHARP (NEEDLE)
NEEDLE HYPO 25X1 1.5 SAFETY (NEEDLE) ×3 IMPLANT
NS IRRIG 1000ML POUR BTL (IV SOLUTION) ×3 IMPLANT
PACK BASIN DAY SURGERY FS (CUSTOM PROCEDURE TRAY) ×3 IMPLANT
PENCIL BUTTON HOLSTER BLD 10FT (ELECTRODE) ×3 IMPLANT
SHEET MEDIUM DRAPE 40X70 STRL (DRAPES) IMPLANT
SLEEVE SCD COMPRESS KNEE MED (MISCELLANEOUS) ×3 IMPLANT
SPONGE GAUZE 4X4 12PLY STER LF (GAUZE/BANDAGES/DRESSINGS) ×3 IMPLANT
SPONGE LAP 4X18 X RAY DECT (DISPOSABLE) ×6 IMPLANT
STAPLER VISISTAT 35W (STAPLE) ×3 IMPLANT
STOCKINETTE IMPERVIOUS LG (DRAPES) IMPLANT
STRIP CLOSURE SKIN 1/2X4 (GAUZE/BANDAGES/DRESSINGS) ×2 IMPLANT
SUT ETHILON 2 0 FS 18 (SUTURE) IMPLANT
SUT MNCRL AB 4-0 PS2 18 (SUTURE) ×3 IMPLANT
SUT MON AB 5-0 PS2 18 (SUTURE) ×3 IMPLANT
SUT SILK 2 0 SH (SUTURE) IMPLANT
SUT VIC AB 2-0 SH 27 (SUTURE) ×2
SUT VIC AB 2-0 SH 27XBRD (SUTURE) ×1 IMPLANT
SUT VIC AB 3-0 SH 27 (SUTURE) ×4
SUT VIC AB 3-0 SH 27X BRD (SUTURE) ×2 IMPLANT
SUT VIC AB 5-0 PS2 18 (SUTURE) IMPLANT
SYR CONTROL 10ML LL (SYRINGE) ×3 IMPLANT
TOWEL OR 17X24 6PK STRL BLUE (TOWEL DISPOSABLE) ×3 IMPLANT
TOWEL OR NON WOVEN STRL DISP B (DISPOSABLE) ×3 IMPLANT
TUBE CONNECTING 20'X1/4 (TUBING) ×1
TUBE CONNECTING 20X1/4 (TUBING) ×2 IMPLANT
YANKAUER SUCT BULB TIP NO VENT (SUCTIONS) ×3 IMPLANT

## 2014-02-22 NOTE — Anesthesia Preprocedure Evaluation (Signed)
Anesthesia Evaluation  Patient identified by MRN, date of birth, ID band Patient awake    Reviewed: Allergy & Precautions, H&P , NPO status , Patient's Chart, lab work & pertinent test results, reviewed documented beta blocker date and time   Airway Mallampati: II TM Distance: >3 FB Neck ROM: full    Dental  (+) Teeth Intact, Dental Advisory Given   Pulmonary asthma , sleep apnea and Continuous Positive Airway Pressure Ventilation ,  breath sounds clear to auscultation        Cardiovascular hypertension, Pt. on medications + dysrhythmias Supra Ventricular Tachycardia Rhythm:regular Rate:Normal  Took Rhythmol this morning   Neuro/Psych negative neurological ROS  negative psych ROS   GI/Hepatic negative GI ROS, Neg liver ROS,   Endo/Other  negative endocrine ROS  Renal/GU      Musculoskeletal   Abdominal   Peds  Hematology negative hematology ROS (+)   Anesthesia Other Findings See surgeon's H&P   Reproductive/Obstetrics negative OB ROS                           Anesthesia Physical Anesthesia Plan  ASA: II  Anesthesia Plan: General and General LMA   Post-op Pain Management: MAC Combined w/ Regional for Post-op pain   Induction:   Airway Management Planned:   Additional Equipment:   Intra-op Plan:   Post-operative Plan:   Informed Consent: I have reviewed the patients History and Physical, chart, labs and discussed the procedure including the risks, benefits and alternatives for the proposed anesthesia with the patient or authorized representative who has indicated his/her understanding and acceptance.   Dental Advisory Given  Plan Discussed with: CRNA and Surgeon  Anesthesia Plan Comments:         Anesthesia Quick Evaluation

## 2014-02-22 NOTE — Transfer of Care (Signed)
Immediate Anesthesia Transfer of Care Note  Patient: Pamela Underwood  Procedure(s) Performed: Procedure(s): RADIOACTIVE SEED(2 seeds)GUIDED PARTIAL MASTECTOMY AND EXCISOIONAL BX WITH AXILLARY SENTINEL LYMPH NODE BIOPSY (Right)  Patient Location: PACU  Anesthesia Type:General  Level of Consciousness: awake, alert  and oriented  Airway & Oxygen Therapy: Patient Spontanous Breathing and Patient connected to face mask oxygen  Post-op Assessment: Report given to PACU RN and Post -op Vital signs reviewed and stable  Post vital signs: Reviewed and stable  Complications: No apparent anesthesia complications

## 2014-02-22 NOTE — Op Note (Signed)
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 Serita Grammes Anesthesia: Gen. With pectoral block Estimated blood loss: Minimal Drains: None Complications: None Specimens: #1 right breast excisional biopsy marked with paint #2 right breast lumpectomy marked with paint #3 radioactive seed from lumpectomy specimen sent separately #4 right axillary sentinel lymph node x2 with counts of 174 and 868 Disposition to recovery stable Sponge count correct at completion  Indications: This is a 58 year old female underwent screening mammography with the finding of a right upper outer quadrant lesion. 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 She underwent evaluation with MRI and also was noted to have an abnormal appearing lymph node as well as an upper inner breast lesion. The node was negative on biopsy. The upper inner lesion was a fibroepithelial lesion. We discussed all of her options and decided to proceed with a lumpectomy, excisional biopsy, and sentinel node biopsy.  Procedure: The patient first had radioactive seeds placed in both lesions. Her mammograms were in the operating room. After informed consent was obtained she then underwent a pectoral block. She was given cefazolin. Sequential compression devices were on her legs. She was then placed under general anesthesia without complication. Her right breast and axilla were then prepped and draped in the standard sterile surgical fashion. A surgical timeout was then performed.  I approached the benign lesion first. This was in the 3:00 position right on the areolar border. I made a periareolar incision using the neoprobe to guide the  excision of the seed and the surrounding tissue. There was no radioactivity left in that position once I removed it. The seed was confirmed to be in the specimen by the neoprobe. I then marked this with paint. I then did a mammogram confirming removal of the clip in the radioactive seed. This was confirmed by radiology and sent to pathology who confirmed removal of the seed. I then closed with 2-0 Vicryl, 3-0 Vicryl, 4-0 Monocryl, and Dermabond and Steri-Strips.  I then made a curvilinear incision in the upper outer quadrant overlying the tumor. I then proceeded using neoprobe to guide removal of the seed in the tissue around it. There was a small hematoma cavity that the seed came out of the specimen. I sent this off the table as a separate specimen. We confirmed removal with mammography as well.  I then removed the umpectomy specimen with an attempt to get a clear margin. I then marked this with paint. Mammogram confirmed removal of the clip which was in the middle of the specimen. This was then sent to pathology as well. This had been confirmed by radiology also. Hemostasis was obtained. I placed clips all around my cavity. I then closed this with 2-0 Vicryl, 3-0 Vicryl, and 4-0 Monocryl. Dermabond Steri-Strips were placed.  I then made a incision below the axillary hairline. I carried this through the axillary fascia. I then used the neoprobe to identify 2 axillary sentinel lymph nodes with the counts listed as above. There was no background radioactivity. I then obtained hemostasis. The 2 sentinel nodes were passed off the table as specimens. I closed the axillary fascia with 2-0 Vicryl. The skin was closed with 3-0 Vicryl, 4-0 Monocryl, Steri-Strips and a dressing. She tolerated this well was extubated and transferred  to recovery stable.

## 2014-02-22 NOTE — Discharge Instructions (Signed)
[facility] °Office Phone Number [phone] ° °BREAST BIOPSY/ PARTIAL MASTECTOMY: POST OP INSTRUCTIONS ° °Always review your discharge instruction sheet given to you by the facility where your surgery was performed. ° °IF YOU HAVE DISABILITY OR FAMILY LEAVE FORMS, YOU MUST BRING THEM TO THE OFFICE FOR PROCESSING.  DO NOT GIVE THEM TO YOUR DOCTOR. ° °1. A prescription for pain medication may be given to you upon discharge.  Take your pain medication as prescribed, if needed.  If narcotic pain medicine is not needed, then you may take acetaminophen (Tylenol), naprosyn (Alleve) or ibuprofen (Advil) as needed. °2. Take your usually prescribed medications unless otherwise directed °3. If you need a refill on your pain medication, please contact your pharmacy.  They will contact our office to request authorization.  Prescriptions will not be filled after 5pm or on week-ends. °4. You should eat very light the first 24 hours after surgery, such as soup, crackers, pudding, etc.  Resume your normal diet the day after surgery. °5. Most patients will experience some swelling and bruising in the breast.  Ice packs and a good support bra will help.  Wear the breast binder provided or a sports bra for 72 hours day and night.  After that wear a sports bra during the day until you return to the office. Swelling and bruising can take several days to resolve.  °6. It is common to experience some constipation if taking pain medication after surgery.  Increasing fluid intake and taking a stool softener will usually help or prevent this problem from occurring.  A mild laxative (Milk of Magnesia or Miralax) should be taken according to package directions if there are no bowel movements after 48 hours. °7. Unless discharge instructions indicate otherwise, you may remove your bandages 48 hours after surgery and you may shower at that time.  You may have steri-strips (small skin tapes) in place directly over the incision.   These strips should be left on the skin for 7-10 days and will come off on their own.  If your surgeon used skin glue on the incision, you may shower in 24 hours.  The glue will flake off over the next 2-3 weeks.  Any sutures or staples will be removed at the office during your follow-up visit. °8. ACTIVITIES:  You may resume regular daily activities (gradually increasing) beginning the next day.  Wearing a good support bra or sports bra minimizes pain and swelling.  You may have sexual intercourse when it is comfortable. °a. You may drive when you no longer are taking prescription pain medication, you can comfortably wear a seatbelt, and you can safely maneuver your car and apply brakes. °b. RETURN TO WORK:  ______________________________________________________________________________________ °9. You should see your doctor in the office for a follow-up appointment approximately two weeks after your surgery.  Your doctor’s nurse will typically make your follow-up appointment when she calls you with your pathology report.  Expect your pathology report 3-4 business days after your surgery.  You may call to check if you do not hear from us after three days. °10. OTHER INSTRUCTIONS: _______________________________________________________________________________________________ _____________________________________________________________________________________________________________________________________ °_____________________________________________________________________________________________________________________________________ °_____________________________________________________________________________________________________________________________________ ° °WHEN TO CALL DR WAKEFIELD: °1. Fever over 101.0 °2. Nausea and/or vomiting. °3. Extreme swelling or bruising. °4. Continued bleeding from incision. °5. Increased pain, redness, or drainage from the incision. ° °The clinic staff is available to  answer your questions during regular business hours.  Please don’t hesitate to call and ask to speak to one of the nurses for   clinical concerns.  If you have a medical emergency, go to the nearest emergency room or call 911.  A surgeon from [facility] is always on call at the hospital. ° °For further questions, please visit [email or web address] ° ° ° °Post Anesthesia Home Care Instructions ° °Activity: °Get plenty of rest for the remainder of the day. A responsible adult should stay with you for 24 hours following the procedure.  °For the next 24 hours, DO NOT: °-Drive a car °-Operate machinery °-Drink alcoholic beverages °-Take any medication unless instructed by your physician °-Make any legal decisions or sign important papers. ° °Meals: °Start with liquid foods such as gelatin or soup. Progress to regular foods as tolerated. Avoid greasy, spicy, heavy foods. If nausea and/or vomiting occur, drink only clear liquids until the nausea and/or vomiting subsides. Call your physician if vomiting continues. ° °Special Instructions/Symptoms: °Your throat may feel dry or sore from the anesthesia or the breathing tube placed in your throat during surgery. If this causes discomfort, gargle with warm salt water. The discomfort should disappear within 24 hours. ° ° ° °Call your surgeon if you experience:  ° °1.  Fever over 101.0. °2.  Inability to urinate. °3.  Nausea and/or vomiting. °4.  Extreme swelling or bruising at the surgical site. °5.  Continued bleeding from the incision. °6.  Increased pain, redness or drainage from the incision. °7.  Problems related to your pain medication. °8. Any change in color, movement and/or sensation °9. Any problems and/or concerns °

## 2014-02-22 NOTE — Anesthesia Postprocedure Evaluation (Signed)
  Anesthesia Post-op Note  Patient: Pamela Underwood  Procedure(s) Performed: Procedure(s): RADIOACTIVE SEED(2 seeds)GUIDED PARTIAL MASTECTOMY AND EXCISOIONAL BX WITH AXILLARY SENTINEL LYMPH NODE BIOPSY (Right)  Patient Location: PACU  Anesthesia Type:GA combined with regional for post-op pain  Level of Consciousness: awake and alert   Airway and Oxygen Therapy: Patient Spontanous Breathing  Post-op Pain: mild  Post-op Assessment: Post-op Vital signs reviewed  Post-op Vital Signs: stable  Last Vitals:  Filed Vitals:   02/22/14 1512  BP:   Pulse: 73  Temp:   Resp: 13    Complications: No apparent anesthesia complications

## 2014-02-22 NOTE — Anesthesia Procedure Notes (Addendum)
Anesthesia Regional Block:  Pectoralis block  Pre-Anesthetic Checklist: ,, timeout performed, Correct Patient, Correct Site, Correct Laterality, Correct Procedure, Correct Position, site marked, Risks and benefits discussed,  Surgical consent,  Pre-op evaluation,  At surgeon's request and post-op pain management  Laterality: Right and Upper  Prep: chloraprep       Needles:   Needle Type: Echogenic Needle     Needle Length: 9cm 9 cm Needle Gauge: 21 and 21 G  Needle insertion depth: 8 cm   Additional Needles:  Procedures: ultrasound guided (picture in chart) Pectoralis block Narrative:  Start time: 02/22/2014 11:45 AM End time: 02/22/2014 12:00 PM Injection made incrementally with aspirations every 5 mL.  Performed by: Personally  Anesthesiologist: t Massagee  Additional Notes: Tolerated well   Procedure Name: LMA Insertion Performed by: Terrance Mass Pre-anesthesia Checklist: Patient identified, Timeout performed, Emergency Drugs available, Suction available and Patient being monitored Patient Re-evaluated:Patient Re-evaluated prior to inductionOxygen Delivery Method: Circle system utilized Preoxygenation: Pre-oxygenation with 100% oxygen Intubation Type: IV induction Ventilation: Mask ventilation without difficulty LMA: LMA inserted LMA Size: 5.0 Tube type: Oral Number of attempts: 1 Placement Confirmation: positive ETCO2 Tube secured with: Tape Dental Injury: Teeth and Oropharynx as per pre-operative assessment

## 2014-02-22 NOTE — H&P (Signed)
History of Present Illness Rolm Bookbinder MD; 02/14/2014 5:11 PM) Patient words: [facility] for New Breast Cancer.  The patient is a 58 year old female who presents with breast cancer. 73 yof who underwent regular screening mm with finding of breast density C and irregular mass in right breast. Ultrasound then showed a 1.1 cm irregular solid mass with irregular margin in right breast as well as oval node with focal cortex thickening in right axilla. There is also a 1.1 cm mass in the right upper inner quadrant. biopsy of all three have been done. the biopsy results show at 40 oclcok a 100% er pos, 85% pr positive, Ki 67- 12% her 2 not amplified grade I IDC with associated DCIS. The second lesion at 1 oclcok is a biphasic epithelial and stromal lesion and the node is negative. She has also undergone mri that shows a normal left breast, bilateral nonspecific IM nodes measuring 4 mm in greatest size. There is a UOQ mass with clip artifact measuring 10x9x14 m in size. There is another clip and post biopsy change in region of other lesion. the right axillary nodes are mildly prominent with some post biopsy change. She comes in today to discuss options. She has history of two FA excisions a long time ago and no FH of breast or ovarian cancer.   Other Problems Ivor [place], [place]; 02/14/2014 4:13 PM) Heart murmur Lump In Breast Sleep Apnea  Past Surgical History Ivor [place], [place]; 02/14/2014 4:13 PM) Breast Biopsy Left. Knee Surgery Left.  Diagnostic Studies History Ivor [place], [place]; 02/14/2014 4:13 PM) Colonoscopy within last year Mammogram within last year Pap Smear 1-5 years ago  Allergies Ivor [place], [place]; 02/14/2014 4:14 PM) Escitalopram Oxalate *ANTIDEPRESSANTS*  Medication History Ivor [place], [place]; 02/14/2014 4:17 PM) Xanax (0.5MG  Tablet, Oral) Active. Valium (5MG  Tablet, Oral) Active. Diltiazem CD (120MG  Capsule ER 24HR, Oral) Active. Rythmol SR (225MG  Capsule ER  12HR, Oral) Active. Vit D-Vit E-Safflower Oil (External) Active. Zolpidem Tartrate (10MG  Tablet, Oral) Active.  Social History Alyse Low [place], [place]; 02/14/2014 4:13 PM) Alcohol use Occasional alcohol use. Caffeine use Coffee. No drug use Tobacco use Never smoker.  Family History Ivor [place], [place]; 02/14/2014 4:13 PM) Heart Disease Father. Heart disease in female family member before age 5 Hypertension Father.  Pregnancy / Birth History Ivor [place], [place]; 02/14/2014 4:13 PM) Age at menarche 42 years. Age of menopause 72-55 Gravida 3 Maternal age 34-30 Para 3  Review of Systems Ivor Costa [place]; 02/14/2014 4:13 PM) General Not Present- Appetite Loss, Chills, Fatigue, Fever, Night Sweats, Weight Gain and Weight Loss. Skin Not Present- Change in Wart/Mole, Dryness, Hives, Jaundice, New Lesions, Non-Healing Wounds, Rash and Ulcer. HEENT Not Present- Earache, Hearing Loss, Hoarseness, Nose Bleed, Oral Ulcers, Ringing in the Ears, Seasonal Allergies, Sinus Pain, Sore Throat, Visual Disturbances, Wears glasses/contact lenses and Yellow Eyes. Respiratory Not Present- Bloody sputum, Chronic Cough, Difficulty Breathing, Snoring and Wheezing. Breast Not Present- Breast Mass, Breast Pain, Nipple Discharge and Skin Changes. Cardiovascular Not Present- Chest Pain, Difficulty Breathing Lying Down, Leg Cramps, Palpitations, Rapid Heart Rate, Shortness of Breath and Swelling of Extremities. Gastrointestinal Not Present- Abdominal Pain, Bloating, Bloody Stool, Change in Bowel Habits, Chronic diarrhea, Constipation, Difficulty Swallowing, Excessive gas, Gets full quickly at meals, Hemorrhoids, Indigestion, Nausea, Rectal Pain and Vomiting. Female Genitourinary Not Present- Frequency, Nocturia, Painful Urination, Pelvic Pain and Urgency. Musculoskeletal Not Present- Back Pain, Joint Pain, Joint Stiffness, Muscle Pain, Muscle Weakness and Swelling of Extremities. Neurological Not Present-  Decreased Memory, Fainting, Headaches,  Numbness, Seizures, Tingling, Tremor, Trouble walking and Weakness. Psychiatric Not Present- Anxiety, Bipolar, Change in Sleep Pattern, Depression, Fearful and Frequent crying. Endocrine Not Present- Cold Intolerance, Excessive Hunger, Hair Changes, Heat Intolerance, Hot flashes and New Diabetes. Hematology Not Present- Easy Bruising, Excessive bleeding, Gland problems, HIV and Persistent Infections.   Vitals Alyse Low [place] MA; 02/14/2014 4:12 PM) 02/14/2014 4:12 PM Weight: 186.4 lb Height: 67in Body Surface Area: 2 m Body Mass Index: 29.19 kg/m Temp.: 97.25F(Temporal)  Pulse: 80 (Regular)  Resp.: 16 (Unlabored)  BP: 152/90 (Sitting, Left Arm, Standard)    Physical Exam Rolm Bookbinder MD; 02/14/2014 4:26 PM) General Mental Status-Alert. Orientation-Oriented X3.  Chest and Lung Exam Chest and lung exam reveals -normal excursion with symmetric chest walls and on auscultation, normal breath sounds, no adventitious sounds and normal vocal resonance.  Breast Nipples Discharge - Bilateral - None. Breast - Left -Note: two old excisional biopsy scars present.  Breast - Right -Note: eccymosis throughout breast.  Breast Lump-No Palpable Breast Mass.  Cardiovascular Cardiovascular examination reveals -on palpation PMI is normal in location and amplitude, no palpable S3 or S4. Normal cardiac borders..  Lymphatic General Lymphatics Description - Normal . Axillary  General Axillary Region: Bilateral - Description - Normal. Note: no [place]as[place] adenopathy     Assessment & Plan Rolm Bookbinder MD; 02/14/2014 5:16 PM) PRIMARY CANCER OF UPPER OUTER QUADRANT OF FEMALE BREAST (174.4  C50.419) Impression: Right breast radioactive seed guided lumpectomy, right breast radioactive seed guided excisional biopsy, right axillary sn biopsy We discussed the staging and pathophysiology of breast cancer. We discussed all of the  different options for treatment for breast cancer including surgery, chemotherapy, radiation therapy, Herceptin, and antiestrogen therapy. We discussed a sentinel lymph node biopsy as she does not appear to having lymph node involvement right now. We discussed the performance of that with injection of radioactive tracer. We discussed that she would have an incision underneath her axillary hairline. We discussed that there is a chance of having a positive node with a sentinel lymph node biopsy and we will await the permanent pathology to make any other first further decisions in terms of her treatment. One of these options might be to return to the operating room to perform an axillary lymph node dissection. We discussed about up to a 5% risk lifetime of chronic shoulder pain as well as lymphedema associated with a sentinel lymph node biopsy. We discussed the options for treatment of the breast cancer which included lumpectomy versus a mastectomy. We discussed the performance of the lumpectomy with seed placement. We discussed a 5-10% chance of a positive margin requiring reexcision in the operating room. We also discussed that she may need radiation therapy if she undergoes lumpectomy. We discussed the mastectomy and the postoperative care for that as well. We discussed that there is no difference in her survival whether she undergoes lumpectomy with radiation therapy versus a mastectomy. There is a slight difference in the local recurrence rate being 3-5% with lumpectomy and about 1% with a mastectomy. We discussed the risks of operation including bleeding, infection, possible reoperation. She understands her further therapy will be based on what her stages at the time of her operation.

## 2014-02-23 LAB — POCT HEMOGLOBIN-HEMACUE: HEMOGLOBIN: 14.3 g/dL (ref 12.0–15.0)

## 2014-02-23 NOTE — Addendum Note (Signed)
Addendum created 02/23/14 9242 by Tawni Millers, CRNA   Modules edited: Charges VN

## 2014-02-27 ENCOUNTER — Encounter: Payer: Self-pay | Admitting: Radiation Oncology

## 2014-02-27 ENCOUNTER — Telehealth (INDEPENDENT_AMBULATORY_CARE_PROVIDER_SITE_OTHER): Payer: Self-pay

## 2014-02-27 DIAGNOSIS — C50912 Malignant neoplasm of unspecified site of left female breast: Secondary | ICD-10-CM

## 2014-02-27 NOTE — Telephone Encounter (Signed)
Pt is a s/p bilateral lumpectomy with a left br cancer needing referrals to [facility]. The pt already had an appt scheduled for Dr Valere Dross on 10/6 so only a medical oncology referral was placed in epic.

## 2014-02-27 NOTE — Progress Notes (Signed)
Location of Breast Cancer: right upper inner  Histology per Pathology Report:  02/22/14 Diagnosis 1. Breast, lumpectomy, right upper inner quadrant lesion - FIBROADENOMA. - FIBROCYSTIC CHANGES WITH CALCIFICATIONS. - BIOPSY SITE REACTION. - NO EVIDENCE OF MALIGNANCY. 2. 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 - INVASIVE CARCINOMA LESS THAN 0.1 CM FROM ANTERIOR MARGIN. 3. Lymph node, sentinel, biopsy, right axillary #1 - ONE BENIGN LYMPH NODE (0/1). 4. Lymph node, sentinel, biopsy, right axillary #2 - ONE BENIGN LYMPH NODE (0/1).  Receptor Status: ER(100%), PR (85%), Her2-neu (-)  Did patient present with symptoms (if so, please note symptoms) or was this found on screening mammography?:  Screening mammogram  Past/Anticipated interventions by surgeon, if any: right lumpectomy, node biopsies, Dr Donne Hazel  Past/Anticipated interventions by medical oncology, if any: Chemotherapy, Dr Jana Hakim: new consult today  Lymphedema issues, if any:    Swelling in right axilla  Pain issues, if any:   Some pain in right axilla, Oxycodone prn  SAFETY ISSUES:  Prior radiation? no  Pacemaker/ICD? no  Possible current pregnancy? no  Is the patient on methotrexate? 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    Andria Rhein, RN 02/27/2014,12:55 PM

## 2014-02-28 ENCOUNTER — Encounter: Payer: Self-pay | Admitting: Oncology

## 2014-02-28 ENCOUNTER — Ambulatory Visit: Payer: 59

## 2014-02-28 ENCOUNTER — Encounter: Payer: Self-pay | Admitting: Radiation Oncology

## 2014-02-28 ENCOUNTER — Telehealth: Payer: Self-pay | Admitting: *Deleted

## 2014-02-28 ENCOUNTER — Ambulatory Visit
Admission: RE | Admit: 2014-02-28 | Discharge: 2014-02-28 | Disposition: A | Discharge status: Home / Self Care | Payer: 59 | Source: Ambulatory Visit | Attending: Radiation Oncology | Admitting: Radiation Oncology

## 2014-02-28 ENCOUNTER — Other Ambulatory Visit: Payer: Self-pay | Admitting: *Deleted

## 2014-02-28 ENCOUNTER — Ambulatory Visit (HOSPITAL_BASED_OUTPATIENT_CLINIC_OR_DEPARTMENT_OTHER): Payer: 59 | Admitting: Oncology

## 2014-02-28 ENCOUNTER — Ambulatory Visit (HOSPITAL_BASED_OUTPATIENT_CLINIC_OR_DEPARTMENT_OTHER): Payer: 59

## 2014-02-28 ENCOUNTER — Encounter: Payer: Self-pay | Admitting: *Deleted

## 2014-02-28 VITALS — BP 123/63 | HR 93 | Temp 98.3°F | Resp 98 | Ht 67.0 in | Wt 187.3 lb

## 2014-02-28 VITALS — BP 126/79 | HR 120 | Temp 98.8°F | Resp 18 | Ht 67.0 in | Wt 187.5 lb

## 2014-02-28 DIAGNOSIS — I471 Supraventricular tachycardia, unspecified: Secondary | ICD-10-CM

## 2014-02-28 DIAGNOSIS — C50211 Malignant neoplasm of upper-inner quadrant of right female breast: Secondary | ICD-10-CM

## 2014-02-28 DIAGNOSIS — C50411 Malignant neoplasm of upper-outer quadrant of right female breast: Secondary | ICD-10-CM

## 2014-02-28 DIAGNOSIS — M170 Bilateral primary osteoarthritis of knee: Secondary | ICD-10-CM

## 2014-02-28 DIAGNOSIS — Z510 Encounter for antineoplastic radiation therapy: Secondary | ICD-10-CM | POA: Insufficient documentation

## 2014-02-28 DIAGNOSIS — E669 Obesity, unspecified: Secondary | ICD-10-CM

## 2014-02-28 DIAGNOSIS — Z170 Estrogen receptor positive status [ER+]: Secondary | ICD-10-CM

## 2014-02-28 DIAGNOSIS — G4733 Obstructive sleep apnea (adult) (pediatric): Secondary | ICD-10-CM

## 2014-02-28 HISTORY — DX: Malignant neoplasm of unspecified site of unspecified female breast: C50.919

## 2014-02-28 LAB — COMPREHENSIVE METABOLIC PANEL (CC13)
ALT: 19 U/L (ref 0–55)
AST: 16 U/L (ref 5–34)
Albumin: 3.9 g/dL (ref 3.5–5.0)
Alkaline Phosphatase: 81 U/L (ref 40–150)
Anion Gap: 8 mEq/L (ref 3–11)
BUN: 13.7 mg/dL (ref 7.0–26.0)
CALCIUM: 9.6 mg/dL (ref 8.4–10.4)
CO2: 24 mEq/L (ref 22–29)
CREATININE: 0.8 mg/dL (ref 0.6–1.1)
Chloride: 107 mEq/L (ref 98–109)
Glucose: 112 mg/dl (ref 70–140)
Potassium: 4 mEq/L (ref 3.5–5.1)
Sodium: 139 mEq/L (ref 136–145)
TOTAL PROTEIN: 7.1 g/dL (ref 6.4–8.3)
Total Bilirubin: 0.65 mg/dL (ref 0.20–1.20)

## 2014-02-28 LAB — CBC WITH DIFFERENTIAL/PLATELET
BASO%: 0.6 % (ref 0.0–2.0)
Basophils Absolute: 0.1 10*3/uL (ref 0.0–0.1)
EOS%: 2 % (ref 0.0–7.0)
Eosinophils Absolute: 0.2 10*3/uL (ref 0.0–0.5)
HCT: 42.9 % (ref 34.8–46.6)
HEMOGLOBIN: 14.3 g/dL (ref 11.6–15.9)
LYMPH#: 3.2 10*3/uL (ref 0.9–3.3)
LYMPH%: 27.8 % (ref 14.0–49.7)
MCH: 31.5 pg (ref 25.1–34.0)
MCHC: 33.3 g/dL (ref 31.5–36.0)
MCV: 94.7 fL (ref 79.5–101.0)
MONO#: 1 10*3/uL — ABNORMAL HIGH (ref 0.1–0.9)
MONO%: 8.8 % (ref 0.0–14.0)
NEUT%: 60.8 % (ref 38.4–76.8)
NEUTROS ABS: 7 10*3/uL — AB (ref 1.5–6.5)
Platelets: 242 10*3/uL (ref 145–400)
RBC: 4.53 10*6/uL (ref 3.70–5.45)
RDW: 12.7 % (ref 11.2–14.5)
WBC: 11.5 10*3/uL — ABNORMAL HIGH (ref 3.9–10.3)

## 2014-02-28 NOTE — Progress Notes (Signed)
[facility] NEW PATIENT EVALUATION  Name: Pamela Underwood MRN: [id number]  Date:   02/28/2014           DOB: [birth year]  Status: outpatient   CC: TOMBLIN Marjean Donna, MD  Rolm Bookbinder, MD , Dr. 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 female who is seen today through the courtesy of Dr. Donne Hazel for discussion of radiation therapy following conservative surgery in the management of her T1 N0 invasive ductal/DCIS of the right breast. At the time of a screening mammogram at [facility] on 02/03/2014 she was found to have a focal asymmetry within the right breast. Ultrasound on September 14 showed a 1.1 cm mass at 11:00 and another mass at 1:00 also measuring approximately 1.1 cm. There was a thickened right axillary lymph node not felt to be suspicious. Ultrasound guided biopsies were diagnostic for invasive ductal carcinoma at 11:00 which was ER/PR positive with a Ki-67 of percent, HER-2/neu negative along with DCIS. The second lesion at 1:00 was benign and the axillary lymph node was also benign. Breast MR showed nonspecific internal mammary lymph nodes measuring 4 mm in greatest size and the 2 right breast lesions. There was no enhancement of the biphasic lesion seen at 1:00. On 02/22/2014 she underwent a right partial mastectomy for each of the right breast lesions and also a sentinel lymph node biopsy. 2 lymph nodes were free of metastatic disease. The lesion at 11:00 again revealed invasive ductal carcinoma/DCIS measuring 1.6 cm. There was invasive disease and DCIS less than 0.1 cm from the anterior margin. The lesion at 1:00 was a fibroadenoma. Dr. Donne Hazel indicated to the patient that the anterior margin was basically skin and there was no more breast tissue to excise. She is doing well postoperatively. She  does have a small amount of fluid within the right axilla. She saw Dr. Jana Hakim he was sending off slides for Oncotype DX testing.  PREVIOUS RADIATION THERAPY: No   PAST MEDICAL HISTORY:  has a past medical history of Insomnia; Atrial tachycardia; Asthma; Sleep apnea; Joint pain; Joint swelling; Back pain; Colitis; Chronic anxiety; Dysrhythmia; and Breast cancer (02/22/14).     PAST SURGICAL HISTORY:  Past Surgical History  Procedure Laterality Date  . Tubal ligation    . Colonoscopy    . Knee arthroscopy with medial menisectomy Left 03/31/2013    Procedure: LEFT KNEE ARTHROSCOPY WITH PARTIAL MEDIAL MENISECTOMY, CHONDROPLASTY OF PATELLA  FEMORAL JOINT, EXCISION OF MEDIAL PLICA, PARTIAL LATERAL MENISECTOMY;  Surgeon: [name], MD;  Location: [place];  Service: Orthopedics;  Laterality: Left;  . Breast biopsy Left 05/02/1999    benign  . Cystoscopy    . Total knee arthroplasty Left 06/29/2013    Procedure: TOTAL KNEE ARTHROPLASTY;  Surgeon: [name], MD;  Location: [place];  Service: Orthopedics;  Laterality: Left;  . Breast lumpectomy with axillary lymph node biopsy Right 02/22/14    upper inner     FAMILY HISTORY: family history includes Heart disease in her father; Hypertension in her father. Her father died of a heart attack in his 27s. Her mother died in a fire in her early 21s. No family history of breast cancer.   SOCIAL HISTORY:  reports that she has never smoked. She has never used smokeless tobacco. She reports that she  drinks alcohol. She reports that she does not use illicit drugs. Married, 3 children. She works as a Marine scientist for [employer].   ALLERGIES: Escitalopram oxalate   MEDICATIONS:  Current Outpatient Prescriptions  Medication Sig Dispense Refill  . ALPRAZolam (XANAX) 0.5 MG tablet Take 0.5 mg by mouth at bedtime as needed. For stress/anxiety      . aspirin 81 MG tablet Take 81 mg by mouth daily.      Marland Kitchen diltiazem (CARDIZEM CD)  120 MG 24 hr capsule TAKE 1 CAPSULE BY MOUTH ONCE DAILY  30 capsule  9  . oxyCODONE-acetaminophen (PERCOCET) 10-325 MG per tablet Take 1 tablet by mouth every 6 (six) hours as needed for pain.  20 tablet  0  . propafenone (RYTHMOL SR) 225 MG 12 hr capsule TAKE 1 CAPSULE BY MOUTH EVERY 12 HOURS  60 capsule  2  . VITAMIN D, CHOLECALCIFEROL, PO Take 1 tablet by mouth daily.       Marland Kitchen zolpidem (AMBIEN) 10 MG tablet Take 5-10 mg by mouth at bedtime as needed. For sleep        No current facility-administered medications for this encounter.     REVIEW OF SYSTEMS:  Pertinent items are noted in HPI.    PHYSICAL EXAM:  height is $RemoveB'5\' 7"'[id number]$  (1.702 m) and weight is 187 lb 8 oz (85.049 kg). Her oral temperature is 98.8 F (37.1 C). Her blood pressure is 126/79 and her pulse is 120. Her respiration is 18.   Alert and oriented 58 year old white female appearing her stated age. Head and neck examination: Grossly unremarkable. Nodes: Without palpable cervical, supraclavicular, or axillary lymphadenopathy. There is a small fluid collection within the right axilla. Her axillary wound is healing well. Breasts: There are 2 partial mastectomy wounds, one extending from 11 to 12:00 and the other from 1 to 4:00 along the left periareolar region. Both are healing well. There is surrounding ecchymosis. No dominant masses are appreciated. Left breast without masses or lesions.  Extremities: Without edema.   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 Local management options include  mastectomy versus partial mastectomy followed by radiation therapy. She desires breast preservation. I need to confirm with Dr. Donne Hazel that her anterior margin is felt to be adequate. We discussed the potential acute and late toxicities of radiation therapy. We also discussed hypo-fractionated treatment which I think would be a good choice for her. Dr. Jana Hakim is sending off Oncotype DX testing, and therefore I will tentatively schedule her for simulation/treatment planning the week of October 26. Lastly, from a technical standpoint, we may applied bolus material every other day to her skin at 11:00 to maximize the dose to the skin surface because of her close anterior margin. She will have an electron beam boost to her tumor bed as well.   PLAN: As discussed above.  I spent 45 minutes face to face with the patient  and more than 50% of that time was spent in counseling and/or coordination of care.

## 2014-02-28 NOTE — Progress Notes (Signed)
[place]  Telephone:(336) [phone] Fax:(336) [phone]     ID: Pamela Underwood DOB: 1956/05/26  MR#: [id number]  NKN#:[id number]  Patient Care Team: Allena Katz, MD as PCP - General (Obstetrics and Gynecology) Jettie Booze, MD as Consulting Physician (Cardiology) Allena Katz, MD as Consulting Physician (Obstetrics and Gynecology) Chauncey Cruel, MD as Consulting Physician (Oncology) OTHER MD:  CHIEF COMPLAINT: Estrogen receptor positive breast cancer  CURRENT TREATMENT: Completing adjuvant therapy   BREAST CANCER HISTORY: Pamela Underwood had routine screening mammography at [facility] 02/03/2014. This showed a focal asymmetry in the right breast and additional views on 02/06/2014 showed an irregular mass in the right breast, 11:00 position. Ultrasound the same day showed this to be 1.1 cm, with irregular margins, and hypoechoic. There was also an oval lymph nodes with focal cortical thickening in the right axilla. There was also, finally, a 1.1 cm oval mass with circumscribed margins in the right breast upper inner quadrant. Color flow imaging there showed no increase in vascularity.biopsy of the upper outer quadrant right breast mass showed invasive ductal carcinoma. The upper inner quadrant right breast lesion was a fibroadenoma versus a benign phyllodes tumor. The right axillary lymph node that was suspicious was negative for metastatic disease (results are taken from MRI dictation, as the pathology report is not in EPIC).  On 02/08/2014 the patient underwent bilateral breast MRIs. This showed, in the upper outer right breast an irregular enhancing mass measuring 1.4 cm. There were mildly prominent right axillary lymph nodes and bilateral nonspecific internal mammary lymph nodes measuring up to 4 mm.  On 02/22/2014 the patient underwent right lumpectomy for the upper inner quadrant lesion which proved to be a fibroadenoma. On the same day right upper outer quadrant  lumpectomy showed an invasive ductal carcinoma measuring 1.6 cm, in the setting of ductal carcinoma in situ. The invasive tumor was less than a millimeter from the anterior margin. Both sentinel lymph nodes were benign.  The patient's subsequent history is as detailed below  INTERVAL HISTORY: Pamela Underwood was evaluated in the breast clinic 02/28/2014. She will see radiation oncology later the same day.  REVIEW OF SYSTEMS: There were no specific symptoms leading to the original mammogram, which was routinely scheduled. The patient denies unusual headaches, visual changes, nausea, vomiting, stiff neck, dizziness, or gait imbalance. There has been no cough, phlegm production, or pleurisy, no chest pain or pressure, and no change in bowel or bladder habits. The patient denies fever, rash, bleeding, unexplained fatigue or unexplained weight loss. A detailed review of systems was otherwise entirely negative.  PAST MEDICAL HISTORY: Past Medical History  Diagnosis Date  . Insomnia     takes Ambien nightly as needed  . Atrial tachycardia     takes rythmol daily and cardiazem daily  . Asthma     as a child  . Sleep apnea     uses a CPAP  . Joint pain   . Joint swelling   . Back pain     occasionally  . Colitis     hx-one  . Chronic anxiety     takes Xanax daily as needed when heart rate going up  . Dysrhythmia     atrial tachycardia- takes meds and controls it  . Breast cancer 02/22/14    right upper inner    PAST SURGICAL HISTORY: Past Surgical History  Procedure Laterality Date  . Tubal ligation    . Colonoscopy    . Knee arthroscopy with  medial menisectomy Left 03/31/2013    Procedure: LEFT KNEE ARTHROSCOPY WITH PARTIAL MEDIAL MENISECTOMY, CHONDROPLASTY OF PATELLA  FEMORAL JOINT, EXCISION OF MEDIAL PLICA, PARTIAL LATERAL MENISECTOMY;  Surgeon: [name], MD;  Location: [place];  Service: Orthopedics;  Laterality: Left;  . Breast biopsy Left 05/02/1999    benign  .  Cystoscopy    . Total knee arthroplasty Left 06/29/2013    Procedure: TOTAL KNEE ARTHROPLASTY;  Surgeon: [name], MD;  Location: [place];  Service: Orthopedics;  Laterality: Left;  . Breast lumpectomy with axillary lymph node biopsy Right 02/22/14    upper inner    FAMILY HISTORY Family History  Problem Relation Age of Onset  . Heart disease Father   . Hypertension Father    the patient's father died from a myocardial infarction in his 65s. The patient's mother also died in her 78s, after her an accidental fire exposure. The patient had one sister, no brothers. There is no history of breast or ovarian cancer in the family  GYNECOLOGIC HISTORY:  No LMP recorded. Patient is postmenopausal. Menarche age 45, first live birth age 19, the patient is GX P3. She stopped having periods at the age of 36. She did not take hormone replacement.  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 Her husband Ronalee Belts is vice Radio producer for a [employer]. Daughter Vida Roller works as a Marine scientist in [place]. Daughter Florentina Jenny teaches kindergarten in [place]. Son Selinda Flavin is Facilities manager in [place]. The patient has no grandchildren. She attends a [employer]    ADVANCED DIRECTIVES: Not in place   HEALTH MAINTENANCE: History  Substance Use Topics  . Smoking status: Never Smoker   . Smokeless tobacco: Never Used  . Alcohol Use: Yes     Comment: socailly     Colonoscopy:  PAP:  Bone density:  Lipid panel:  Allergies  Allergen Reactions  . Escitalopram Oxalate Other (See Comments)    Hives, welts, all over skin rash    Current Outpatient Prescriptions  Medication Sig Dispense Refill  . ALPRAZolam (XANAX) 0.5 MG tablet Take 0.5 mg by mouth at bedtime as needed. For stress/anxiety      . aspirin 81 MG tablet Take 81 mg by mouth daily.      Marland Kitchen diltiazem (CARDIZEM CD) 120 MG 24 hr capsule TAKE 1 CAPSULE BY MOUTH ONCE DAILY  30 capsule  9  .  oxyCODONE-acetaminophen (PERCOCET) 10-325 MG per tablet Take 1 tablet by mouth every 6 (six) hours as needed for pain.  20 tablet  0  . propafenone (RYTHMOL SR) 225 MG 12 hr capsule TAKE 1 CAPSULE BY MOUTH EVERY 12 HOURS  60 capsule  2  . VITAMIN D, CHOLECALCIFEROL, PO Take 1 tablet by mouth daily.       Marland Kitchen zolpidem (AMBIEN) 10 MG tablet Take 5-10 mg by mouth at bedtime as needed. For sleep        No current facility-administered medications for this visit.    OBJECTIVE: Middle-aged white woman who appears stated age 58 Vitals:   02/28/14 1202  BP: 123/63  Pulse: 93  Temp: 98.3 F (36.8 C)  Resp: 98     Body mass index is 29.33 kg/(m^2).    ECOG FS:0 - Asymptomatic  Ocular: Sclerae unicteric, pupils equal, round and reactive to light Ear-nose-throat: Oropharynx clear, dentition in good repair Lymphatic: No cervical or supraclavicular adenopathy Lungs no rales or rhonchi, good excursion  bilaterally Heart regular rate and rhythm, no murmur appreciated Abd soft, nontender, positive bowel sounds MSK no focal spinal tenderness, no joint edema Neuro: non-focal, well-oriented, appropriate affect Breasts: The right breast is unremarkable. The left breast status post recent lumpectomy. The incisions are healing nicely, with no dehiscence, erythema, or swelling. I do not palpate any masses in the right axilla. The left breast is unremarkable   LAB RESULTS:  CMP     Component Value Date/Time   NA 139 02/28/2014 1148   NA 140 02/20/2014 0930   K 4.0 02/28/2014 1148   K 4.8 02/20/2014 0930   CL 101 02/20/2014 0930   CO2 24 02/28/2014 1148   CO2 28 02/20/2014 0930   GLUCOSE 112 02/28/2014 1148   GLUCOSE 104* 02/20/2014 0930   BUN 13.7 02/28/2014 1148   BUN 14 02/20/2014 0930   CREATININE 0.8 02/28/2014 1148   CREATININE 0.72 02/20/2014 0930   CALCIUM 9.6 02/28/2014 1148   CALCIUM 9.5 02/20/2014 0930   PROT 7.1 02/28/2014 1148   PROT 7.3 06/27/2013 1403   ALBUMIN 3.9 02/28/2014 1148   ALBUMIN  4.3 06/27/2013 1403   AST 16 02/28/2014 1148   AST 19 06/27/2013 1403   ALT 19 02/28/2014 1148   ALT 24 06/27/2013 1403   ALKPHOS 81 02/28/2014 1148   ALKPHOS 75 06/27/2013 1403   BILITOT 0.65 02/28/2014 1148   BILITOT 0.4 06/27/2013 1403   GFRNONAA >90 02/20/2014 0930   GFRAA >90 02/20/2014 0930    I No results found for this basename: SPEP,  UPEP,   kappa and lambda light chains    Lab Results  Component Value Date   WBC 11.5* 02/28/2014   NEUTROABS 7.0* 02/28/2014   HGB 14.3 02/28/2014   HCT 42.9 02/28/2014   MCV 94.7 02/28/2014   PLT 242 02/28/2014      Chemistry      Component Value Date/Time   NA 139 02/28/2014 1148   NA 140 02/20/2014 0930   K 4.0 02/28/2014 1148   K 4.8 02/20/2014 0930   CL 101 02/20/2014 0930   CO2 24 02/28/2014 1148   CO2 28 02/20/2014 0930   BUN 13.7 02/28/2014 1148   BUN 14 02/20/2014 0930   CREATININE 0.8 02/28/2014 1148   CREATININE 0.72 02/20/2014 0930      Component Value Date/Time   CALCIUM 9.6 02/28/2014 1148   CALCIUM 9.5 02/20/2014 0930   ALKPHOS 81 02/28/2014 1148   ALKPHOS 75 06/27/2013 1403   AST 16 02/28/2014 1148   AST 19 06/27/2013 1403   ALT 19 02/28/2014 1148   ALT 24 06/27/2013 1403   BILITOT 0.65 02/28/2014 1148   BILITOT 0.4 06/27/2013 1403       No results found for this basename: LABCA2    No components found with this basename: LABCA125    No results found for this basename: INR,  in the last 168 hours  Urinalysis    Component Value Date/Time   COLORURINE YELLOW 06/27/2013 1401   APPEARANCEUR CLEAR 06/27/2013 1401   LABSPEC 1.026 06/27/2013 1401   PHURINE 5.0 06/27/2013 1401   GLUCOSEU NEGATIVE 06/27/2013 1401   HGBUR MODERATE* 06/27/2013 1401   BILIRUBINUR NEGATIVE 06/27/2013 1401   KETONESUR NEGATIVE 06/27/2013 1401   PROTEINUR NEGATIVE 06/27/2013 1401   UROBILINOGEN 0.2 06/27/2013 1401   NITRITE NEGATIVE 06/27/2013 1401   LEUKOCYTESUR NEGATIVE 06/27/2013 1401    STUDIES: Mr Breast Bilateral W Wo Contrast  02/09/2014   CLINICAL DATA:  58 year old  female with newly diagnosed invasive ductal carcinoma at 11 o'clock within the right breast. An additional lesion at 1 o'clock within the right breast has been biopsied demonstrating biphasic epithelial stromal lesion, fibroadenoma versus benign phylloides. A right axillary lymph node was also biopsied and was negative for metastasis.  LABS:  No labs were performed at the imaging center.  EXAM: BILATERAL BREAST MRI WITH AND WITHOUT CONTRAST  TECHNIQUE: Multiplanar, multisequence MR images of both breasts were obtained prior to and following the intravenous administration of 52ml of MultiHance.  THREE-DIMENSIONAL MR IMAGE RENDERING ON INDEPENDENT WORKSTATION:  Three-dimensional MR images were rendered by post-processing of the original MR data on an independent workstation. The three-dimensional MR images were interpreted, and findings are reported in the following complete MRI report for this study. Three dimensional images were evaluated at the independent DynaCad workstation  COMPARISON:  Outside mammogram and ultrasound dated 02/06/2014. Screening mammogram dated 02/03/2014.  FINDINGS: The exam is mildly motion limited.  Breast composition: c:  Heterogeneous fibroglandular tissue  Background parenchymal enhancement: Moderate  Right breast: Within the upper, outer right breast there is an irregular enhancing mass with associated biopsy clip measuring 10 x 9 x 14 mm. This mass corresponds to the known biopsy proven malignancy. Clip and post biopsy changes are seen within the medial right breast. No significant surrounding enhancement. This site corresponds to the biopsy demonstrating a biphasic epithelial and stromal lesion.  Left breast: No suspicious enhancement.  Lymph nodes: There are mildly prominent right axillary lymph nodes with surrounding post biopsy change. Biopsy of a right axillary lymph node was negative for metastasis. There are bilateral nonspecific internal mammary lymph nodes measuring 4 mm in  greatest size.  Ancillary findings:  None.  IMPRESSION: 1. Known cancer, upper, outer right breast. 2. No significant enhancement surrounding the site of biopsy within the medial right breast demonstrating a biphasic epithelial and stromal lesion. 3. Mildly prominent right axillary lymph nodes with surrounding post biopsy change. Biopsy of a right axillary lymph node was negative. 4. Nonspecific bilateral 4 mm internal mammary lymph nodes.  RECOMMENDATION: Treatment plan.  BI-RADS CATEGORY  6: Known biopsy-proven malignancy.   Electronically Signed   By: Pamelia Hoit M.D.   On: 02/09/2014 11:36   Nm Sentinel Node Inj-no Rpt (breast)  02/22/2014   CLINICAL DATA: right axillary sn biopsy   Sulfur colloid was injected intradermally by the nuclear medicine  technologist for breast cancer sentinel node localization.     ASSESSMENT: 58 y.o. Pamela Underwood woman status post left lumpectomy and sentinel lymph node sampling 02/22/2014 for a pT1c pN0, stage IA invasive ductal carcinoma, grade 1 estrogen receptor 100% positive, progesterone receptor 85% positive, with no HER-2 amplification and an MIB-1 of 12%  (1) Oncotype requested 02/28/2014, results pending   PLAN: We spent the better part of today's hour-long appointment discussing the biology of breast cancer in general, and the specifics of the patient's tumor in particular. Pamela Underwood understands that she has a stage I slow-growing nonaggressive looking breast cancer. This types out phenotypically as a luminal a. Those tumors generally doing very well and benefit little from chemotherapy.  To help [place] with the chemotherapy decision we are sending an Oncotype. We discussed the difference between local and systemic therapy and she understands that she will need radiation to complete the local part of the treatment. As far as systemic treatment is concerned she understands that she will benefit from antiestrogen pills for 5-10 years.  My suspicion is that her Oncotype  will fall in the "low-risk" category and that therefore she will be able to proceed directly to radiation. Accordingly I am making her a return appointment to see me in 2 months. Of course if the Oncotype score falls in the intermediate or high range she will have to see me sooner so we can discuss the chemotherapy option in more depth. If it falls in the low risk range I will call her with results and she can then proceed to radiation  Pamela Underwood has a good understanding of the overall plan. She agrees with it. She knows the goal of treatment in her case is cure. She will call with any problems that may develop before her next visit here.  Chauncey Cruel, MD   02/28/2014 2:48 PM

## 2014-02-28 NOTE — Progress Notes (Signed)
Received oncotype dx order from Dr. Jana Hakim. Requisition sent to pathology. Received by Alyse Low.

## 2014-02-28 NOTE — Progress Notes (Signed)
Checked in new patient with no financial issues prior to seeing the dr. She has appt card and [facility] packet. She has not been [place] of the [place]. I advised of alight fund and call me if need asst.

## 2014-02-28 NOTE — Progress Notes (Signed)
Please see the Nurse Progress Note in the MD Initial Consult Encounter for this patient. 

## 2014-02-28 NOTE — Telephone Encounter (Signed)
Per Varney Biles - she received appt date and time from Dr. Jana Hakim.  Added appt for today.  Called Kim in lab to add lab appt.  Called Apolonio Schneiders to make her aware of the add on.  Made nurse and NT aware of the add on as well.  Emailed Engineer, civil (consulting) at [employer] to make her aware. Unable to mail intake form - placed a note for one to be given at time of check in.  Went to Med [facility] and obtained a chart.  Created chart, added to spreadsheet and placed in Dr. Virgie Dad box.

## 2014-03-01 ENCOUNTER — Telehealth: Payer: Self-pay | Admitting: Oncology

## 2014-03-01 ENCOUNTER — Encounter: Payer: Self-pay | Admitting: *Deleted

## 2014-03-01 NOTE — Progress Notes (Signed)
[place] Psychosocial Distress Screening Clinical Social Work  Clinical Social Work was referred by distress screening protocol.  The patient scored a 5 on the Psychosocial Distress Thermometer which indicates mild distress. Clinical Social Worker phoned pt to assess for distress and other psychosocial needs. Pt denies current concerns. Her main issue was related to her surgery pain. She reports this was addressed by MD on date of visit. CSW reviewed Support Programs available to assist her and she may be interested in [employer], but wants to think about this more first. Pt aware of how to contact CSW as needed and was appreciative of the call.   ONCBCN DISTRESS SCREENING 02/28/2014  Screening Type Initial Screening  Mark the number that describes how much distress you have been experiencing in the past week 5  Physical Problem type Pain    Clinical Social Worker follow up needed: No.  If yes, follow up plan:  Loren Racer, [place]  [facility] Phone: [phone] Fax: [phone]

## 2014-03-01 NOTE — Telephone Encounter (Signed)
per pof to sch pt appt-cld & left pt message -mailing copy of sch

## 2014-03-08 ENCOUNTER — Telehealth (INDEPENDENT_AMBULATORY_CARE_PROVIDER_SITE_OTHER): Payer: Self-pay

## 2014-03-08 DIAGNOSIS — C50911 Malignant neoplasm of unspecified site of right female breast: Secondary | ICD-10-CM

## 2014-03-08 NOTE — Telephone Encounter (Signed)
Pt seen in office today and order placed in epic for PT.

## 2014-03-09 ENCOUNTER — Encounter (HOSPITAL_COMMUNITY): Payer: Self-pay

## 2014-03-09 ENCOUNTER — Encounter: Payer: Self-pay | Admitting: *Deleted

## 2014-03-09 NOTE — Progress Notes (Signed)
Received Oncotype Dx results of 8.  Emailed Dr. Jana Hakim w/ results.  Placed a copy in his box and took a copy to HIM to scan.

## 2014-03-10 ENCOUNTER — Telehealth: Payer: Self-pay | Admitting: *Deleted

## 2014-03-10 NOTE — Telephone Encounter (Signed)
Called pt with oncotype results of 8/6%. Pt ecstatic and grateful for the call. Confirmed CT SIM on 03/20/14. Pt denies further needs at this time.

## 2014-03-20 ENCOUNTER — Ambulatory Visit
Admission: RE | Admit: 2014-03-20 | Discharge: 2014-03-20 | Disposition: A | Discharge status: Home / Self Care | Payer: 59 | Source: Ambulatory Visit | Attending: Radiation Oncology | Admitting: Radiation Oncology

## 2014-03-20 DIAGNOSIS — Z510 Encounter for antineoplastic radiation therapy: Secondary | ICD-10-CM | POA: Diagnosis not present

## 2014-03-20 DIAGNOSIS — C50211 Malignant neoplasm of upper-inner quadrant of right female breast: Secondary | ICD-10-CM

## 2014-03-20 NOTE — Progress Notes (Signed)
Complex simulation/treatment planning note: The patient was taken to the CT similar. A Vac lock immobilization device was constructed on a custom breast board for immobilization. Her right breast field borders were marked with radiopaque wires along with her partial mastectomy scar along the upper outer quadrant at 11:00. I marked her skin for construction of 0.8 cm custom bolus to maximize the dose to the skin surface. She was then scanned. The CT data set was sent to the planning system where I contoured her lumpectomy tumor bed. She was setup to medial and lateral right breast tangents with unique MLCs. I'm prescribing 4250 cGy in 17 sessions utilizing 6 MV photons. She is now ready for 3-D simulation. The right breast tumor bed will be boosted for a further 1000 cGy in 4 sessions utilizing 15 MV electrons.

## 2014-03-21 ENCOUNTER — Encounter: Payer: Self-pay | Admitting: Radiation Oncology

## 2014-03-21 DIAGNOSIS — Z510 Encounter for antineoplastic radiation therapy: Secondary | ICD-10-CM | POA: Diagnosis not present

## 2014-03-21 NOTE — Progress Notes (Signed)
3-D simulation note: The patient completed 3-D simulation/treatment planning today for treatment of her right breast. She is being treated with medial and lateral right breast tangents. There is a reduced MLC for each field for a total of 4 complex treatment devices. She is being treated with bolus and non-bolus fields and I am prescribing 4250 cGy in 17 sessions. Dose final histograms were obtained for the target structures/tumor bed and also avoidance structures including the lungs and heart. She will then have an electron beam boost for a further 1000 cGy in 5 sessions.

## 2014-03-27 ENCOUNTER — Inpatient Hospital Stay
Admission: RE | Admit: 2014-03-27 | Discharge: 2014-03-27 | Disposition: A | Discharge status: Home / Self Care | Payer: 59 | Source: Ambulatory Visit | Attending: Radiation Oncology | Admitting: Radiation Oncology

## 2014-03-27 ENCOUNTER — Encounter: Payer: Self-pay | Admitting: Radiation Oncology

## 2014-03-27 ENCOUNTER — Ambulatory Visit
Admission: RE | Admit: 2014-03-27 | Discharge: 2014-03-27 | Disposition: A | Discharge status: Home / Self Care | Payer: 59 | Source: Ambulatory Visit | Attending: Radiation Oncology | Admitting: Radiation Oncology

## 2014-03-27 DIAGNOSIS — C50211 Malignant neoplasm of upper-inner quadrant of right female breast: Secondary | ICD-10-CM

## 2014-03-27 DIAGNOSIS — Z510 Encounter for antineoplastic radiation therapy: Secondary | ICD-10-CM | POA: Diagnosis not present

## 2014-03-27 MED ORDER — ALRA NON-METALLIC DEODORANT (RAD-ONC)
1.0000 "application " | Freq: Once | TOPICAL | Status: AC
  Administered 2014-03-27: 1 via TOPICAL

## 2014-03-27 MED ORDER — RADIAPLEXRX EX GEL
Freq: Once | CUTANEOUS | Status: AC
  Administered 2014-03-27: 17:00:00 via TOPICAL

## 2014-03-27 NOTE — Progress Notes (Signed)
Simulation verification note: The patient underwent simulation verification for treatment to her right breast. Her isocenter is in good position and the multileaf collimators contoured the treatment volume appropriately.

## 2014-03-27 NOTE — Progress Notes (Signed)
Took patient alra deodorant and radiplexx gel and radiation therapy and you book to lianc#2 dressing room,pt education done, skin irritation, fatigue,pain, increase protein in diet, use of alra and radiaplex gel to be applied after rad txs daily and prn, just not 4 hours prior to rad txs, sees MD weekly/prn, drink plenty fluids, skin product flyer sheet given also, use dove unscented soap recommended, all questions answered, pati has question on work letter needs to be claruified better stated patient, teach back given 4:45 PM

## 2014-03-28 ENCOUNTER — Ambulatory Visit: Payer: 59 | Admitting: Physical Therapy

## 2014-03-28 ENCOUNTER — Ambulatory Visit
Admission: RE | Admit: 2014-03-28 | Discharge: 2014-03-28 | Disposition: A | Discharge status: Home / Self Care | Payer: 59 | Source: Ambulatory Visit | Attending: Radiation Oncology | Admitting: Radiation Oncology

## 2014-03-28 DIAGNOSIS — Z510 Encounter for antineoplastic radiation therapy: Secondary | ICD-10-CM | POA: Diagnosis not present

## 2014-03-29 ENCOUNTER — Ambulatory Visit
Admission: RE | Admit: 2014-03-29 | Discharge: 2014-03-29 | Disposition: A | Discharge status: Home / Self Care | Payer: 59 | Source: Ambulatory Visit | Attending: Radiation Oncology | Admitting: Radiation Oncology

## 2014-03-29 DIAGNOSIS — Z510 Encounter for antineoplastic radiation therapy: Secondary | ICD-10-CM | POA: Diagnosis not present

## 2014-03-30 ENCOUNTER — Ambulatory Visit: Payer: 59 | Attending: General Surgery | Admitting: Physical Therapy

## 2014-03-30 ENCOUNTER — Ambulatory Visit
Admission: RE | Admit: 2014-03-30 | Discharge: 2014-03-30 | Disposition: A | Discharge status: Home / Self Care | Payer: 59 | Source: Ambulatory Visit | Attending: Radiation Oncology | Admitting: Radiation Oncology

## 2014-03-30 DIAGNOSIS — Z510 Encounter for antineoplastic radiation therapy: Secondary | ICD-10-CM | POA: Diagnosis not present

## 2014-03-31 ENCOUNTER — Ambulatory Visit
Admission: RE | Admit: 2014-03-31 | Discharge: 2014-03-31 | Disposition: A | Discharge status: Home / Self Care | Payer: 59 | Source: Ambulatory Visit | Attending: Radiation Oncology | Admitting: Radiation Oncology

## 2014-03-31 DIAGNOSIS — Z510 Encounter for antineoplastic radiation therapy: Secondary | ICD-10-CM | POA: Diagnosis not present

## 2014-04-03 ENCOUNTER — Ambulatory Visit
Admission: RE | Admit: 2014-04-03 | Discharge: 2014-04-03 | Disposition: A | Discharge status: Home / Self Care | Payer: 59 | Source: Ambulatory Visit | Attending: Radiation Oncology | Admitting: Radiation Oncology

## 2014-04-03 ENCOUNTER — Encounter: Payer: Self-pay | Admitting: Radiation Oncology

## 2014-04-03 VITALS — BP 140/78 | HR 83 | Temp 98.2°F | Resp 10 | Wt 189.7 lb

## 2014-04-03 DIAGNOSIS — C50211 Malignant neoplasm of upper-inner quadrant of right female breast: Secondary | ICD-10-CM

## 2014-04-03 DIAGNOSIS — Z510 Encounter for antineoplastic radiation therapy: Secondary | ICD-10-CM | POA: Diagnosis not present

## 2014-04-03 NOTE — Progress Notes (Signed)
She is currently in no pain. Pt complains of, Fatigue.  Pt right breast warm dry and intact, noted slight redness and swelling to right axillar incision, no drainage with slight tenderness. Reports some "stinging" to right breast. Continues to apply Radiaplex as directed. No edema noted in bilateral upper extremities.  The patient eats a regular, healthy diet.

## 2014-04-03 NOTE — Progress Notes (Signed)
   Weekly Management Note:  outpatient    ICD-9-CM ICD-10-CM   1. Breast cancer of upper-inner quadrant of right female breast 174.2 C50.211     Current Dose:  12.5 Gy  Projected Dose: 42.5 Gy   Narrative:  The patient presents for routine under treatment assessment.  CBCT/MVCT images/Port film x-rays were reviewed.  The chart was checked. Doing well.  Physical Findings:  weight is 189 lb 11.2 oz (86.047 kg). Her oral temperature is 98.2 F (36.8 C). Her blood pressure is 140/78 and her pulse is 83. Her respiration is 10 and oxygen saturation is 100%.  no skin reaction thus far  Impression:  The patient is tolerating radiotherapy.  Plan:  Continue radiotherapy as planned.    ________________________________   Eppie Gibson, M.D.

## 2014-04-04 ENCOUNTER — Ambulatory Visit
Admission: RE | Admit: 2014-04-04 | Discharge: 2014-04-04 | Disposition: A | Discharge status: Home / Self Care | Payer: 59 | Source: Ambulatory Visit | Attending: Radiation Oncology | Admitting: Radiation Oncology

## 2014-04-04 DIAGNOSIS — Z510 Encounter for antineoplastic radiation therapy: Secondary | ICD-10-CM | POA: Diagnosis not present

## 2014-04-05 ENCOUNTER — Ambulatory Visit
Admission: RE | Admit: 2014-04-05 | Discharge: 2014-04-05 | Disposition: A | Discharge status: Home / Self Care | Payer: 59 | Source: Ambulatory Visit | Attending: Radiation Oncology | Admitting: Radiation Oncology

## 2014-04-05 DIAGNOSIS — Z510 Encounter for antineoplastic radiation therapy: Secondary | ICD-10-CM | POA: Diagnosis not present

## 2014-04-06 ENCOUNTER — Ambulatory Visit
Admission: RE | Admit: 2014-04-06 | Discharge: 2014-04-06 | Disposition: A | Discharge status: Home / Self Care | Payer: 59 | Source: Ambulatory Visit | Attending: Radiation Oncology | Admitting: Radiation Oncology

## 2014-04-06 DIAGNOSIS — Z510 Encounter for antineoplastic radiation therapy: Secondary | ICD-10-CM | POA: Diagnosis not present

## 2014-04-07 ENCOUNTER — Ambulatory Visit
Admission: RE | Admit: 2014-04-07 | Discharge: 2014-04-07 | Disposition: A | Discharge status: Home / Self Care | Payer: 59 | Source: Ambulatory Visit | Attending: Radiation Oncology | Admitting: Radiation Oncology

## 2014-04-07 DIAGNOSIS — Z510 Encounter for antineoplastic radiation therapy: Secondary | ICD-10-CM | POA: Diagnosis not present

## 2014-04-10 ENCOUNTER — Ambulatory Visit
Admission: RE | Admit: 2014-04-10 | Discharge: 2014-04-10 | Disposition: A | Discharge status: Home / Self Care | Payer: 59 | Source: Ambulatory Visit | Attending: Radiation Oncology | Admitting: Radiation Oncology

## 2014-04-10 ENCOUNTER — Encounter: Payer: Self-pay | Admitting: Radiation Oncology

## 2014-04-10 VITALS — BP 138/74 | HR 98 | Temp 98.1°F | Resp 20 | Wt 189.6 lb

## 2014-04-10 DIAGNOSIS — Z510 Encounter for antineoplastic radiation therapy: Secondary | ICD-10-CM | POA: Diagnosis not present

## 2014-04-10 DIAGNOSIS — C50211 Malignant neoplasm of upper-inner quadrant of right female breast: Secondary | ICD-10-CM

## 2014-04-10 NOTE — Progress Notes (Signed)
Weekly Management Note:  Site:right breast Current Dose:  2500  cGy Projected Dose: 4250  cGy followed by a boost of 1000 cGy in 4 sessions  Narrative: The patient is seen today for routine under treatment assessment. CBCT/MVCT images/port films were reviewed. The chart was reviewed.   She still doing well although she does have slight discomfort related to her left knee replacement. She uses Radioplex gel.  Physical Examination:  Filed Vitals:   04/10/14 1556  BP: 138/74  Pulse: 98  Temp: 98.1 F (36.7 C)  Resp: 20  .  Weight: 189 lb 9.6 oz (86.002 kg). No significant skin changes.  Impression: Tolerating radiation therapy well.  Plan: Continue radiation therapy as planned.

## 2014-04-10 NOTE — Progress Notes (Addendum)
Patient has some pain with her left knee s/p knee replacement. She denies other pain, fatigue, loss of appetite. She is applying Radiaplex to right breast; no skin changes at this time.

## 2014-04-11 ENCOUNTER — Ambulatory Visit
Admission: RE | Admit: 2014-04-11 | Discharge: 2014-04-11 | Disposition: A | Discharge status: Home / Self Care | Payer: 59 | Source: Ambulatory Visit | Attending: Radiation Oncology | Admitting: Radiation Oncology

## 2014-04-11 DIAGNOSIS — Z510 Encounter for antineoplastic radiation therapy: Secondary | ICD-10-CM | POA: Diagnosis not present

## 2014-04-12 ENCOUNTER — Ambulatory Visit
Admission: RE | Admit: 2014-04-12 | Discharge: 2014-04-12 | Disposition: A | Discharge status: Home / Self Care | Payer: 59 | Source: Ambulatory Visit | Attending: Radiation Oncology | Admitting: Radiation Oncology

## 2014-04-12 DIAGNOSIS — Z510 Encounter for antineoplastic radiation therapy: Secondary | ICD-10-CM | POA: Diagnosis not present

## 2014-04-13 ENCOUNTER — Ambulatory Visit
Admission: RE | Admit: 2014-04-13 | Discharge: 2014-04-13 | Disposition: A | Discharge status: Home / Self Care | Payer: 59 | Source: Ambulatory Visit | Attending: Radiation Oncology | Admitting: Radiation Oncology

## 2014-04-13 DIAGNOSIS — Z510 Encounter for antineoplastic radiation therapy: Secondary | ICD-10-CM | POA: Diagnosis not present

## 2014-04-14 ENCOUNTER — Ambulatory Visit
Admission: RE | Admit: 2014-04-14 | Discharge: 2014-04-14 | Disposition: A | Discharge status: Home / Self Care | Payer: 59 | Source: Ambulatory Visit | Attending: Radiation Oncology | Admitting: Radiation Oncology

## 2014-04-14 DIAGNOSIS — Z510 Encounter for antineoplastic radiation therapy: Secondary | ICD-10-CM | POA: Diagnosis not present

## 2014-04-17 ENCOUNTER — Encounter: Payer: Self-pay | Admitting: Radiation Oncology

## 2014-04-17 ENCOUNTER — Ambulatory Visit
Admission: RE | Admit: 2014-04-17 | Discharge: 2014-04-17 | Disposition: A | Discharge status: Home / Self Care | Payer: 59 | Source: Ambulatory Visit | Attending: Radiation Oncology | Admitting: Radiation Oncology

## 2014-04-17 VITALS — BP 141/94 | HR 130 | Temp 98.5°F | Resp 16 | Ht 67.0 in | Wt 189.0 lb

## 2014-04-17 DIAGNOSIS — Z510 Encounter for antineoplastic radiation therapy: Secondary | ICD-10-CM | POA: Diagnosis not present

## 2014-04-17 DIAGNOSIS — C50211 Malignant neoplasm of upper-inner quadrant of right female breast: Secondary | ICD-10-CM

## 2014-04-17 NOTE — Progress Notes (Signed)
Weekly Management Note:  Site: Right breast Current Dose:  3750  cGy Projected Dose: 4250  cGy followed by 4 fraction boost of 1000 cGy  Narrative: The patient is seen today for routine under treatment assessment. CBCT/MVCT images/port films were reviewed. The chart was reviewed.   She reports pruritic skin along the upper inner portion of her right breast.  She has been using Radioplex gel.  Physical Examination:  Filed Vitals:   04/17/14 1621  BP: 141/94  Pulse: 130  Temp: 98.5 F (36.9 C)  Resp: 16  .  Weight: 189 lb (85.73 kg).  There is a papular radiation dermatitis along the upper inner quadrant of her right breast.  There is erythema elsewhere.  No areas of desquamation.  Impression: Tolerating radiation therapy well.  I told she can use hydrocortisone cream when necessary.  Biafine is contraindicated with her current allergy profile.  Plan: Continue radiation therapy as planned.

## 2014-04-17 NOTE — Progress Notes (Signed)
Pamela Underwood has completed 15 fractions to her right breast.  She denies pain.  The skin on her right breast is pink with a rash on the upper portion of her breast.  She reports that the rash itches.  She is using radiaplex gel twice a day.  Her bp was elevated today at 141/94 and her heart rate was 130.  She reports that she was out of her Rythmol yesterday.

## 2014-04-17 NOTE — Progress Notes (Signed)
Patient was advised to use hydrocortisone cream as needed on the upper portions of her right breast for itching.  Patient verbalized agreement.

## 2014-04-18 ENCOUNTER — Ambulatory Visit
Admission: RE | Admit: 2014-04-18 | Discharge: 2014-04-18 | Disposition: A | Discharge status: Home / Self Care | Payer: 59 | Source: Ambulatory Visit | Attending: Radiation Oncology | Admitting: Radiation Oncology

## 2014-04-18 DIAGNOSIS — Z510 Encounter for antineoplastic radiation therapy: Secondary | ICD-10-CM | POA: Diagnosis not present

## 2014-04-19 ENCOUNTER — Encounter: Payer: Self-pay | Admitting: Radiation Oncology

## 2014-04-19 ENCOUNTER — Ambulatory Visit
Admission: RE | Admit: 2014-04-19 | Discharge: 2014-04-19 | Disposition: A | Discharge status: Home / Self Care | Payer: 59 | Source: Ambulatory Visit | Attending: Radiation Oncology | Admitting: Radiation Oncology

## 2014-04-19 DIAGNOSIS — Z510 Encounter for antineoplastic radiation therapy: Secondary | ICD-10-CM | POA: Diagnosis not present

## 2014-04-19 NOTE — Progress Notes (Signed)
Electron beam simulation note: The patient underwent complex virtual simulation for her electron beam boost to the right breast.  She is set up en face.  She underwent a Monte Carlo planning to deliver 1000 cGy in 4 sessions with 15 MEV electrons.  One custom block is constructed to conform the field.  An isodose plan is requested.

## 2014-04-21 ENCOUNTER — Ambulatory Visit: Payer: 59

## 2014-04-24 ENCOUNTER — Encounter: Payer: Self-pay | Admitting: Radiation Oncology

## 2014-04-24 ENCOUNTER — Ambulatory Visit
Admission: RE | Admit: 2014-04-24 | Discharge: 2014-04-24 | Disposition: A | Discharge status: Home / Self Care | Payer: 59 | Source: Ambulatory Visit | Attending: Radiation Oncology | Admitting: Radiation Oncology

## 2014-04-24 VITALS — BP 147/83 | HR 88 | Temp 98.4°F | Ht 67.0 in | Wt 191.4 lb

## 2014-04-24 DIAGNOSIS — C50211 Malignant neoplasm of upper-inner quadrant of right female breast: Secondary | ICD-10-CM

## 2014-04-24 DIAGNOSIS — Z510 Encounter for antineoplastic radiation therapy: Secondary | ICD-10-CM | POA: Diagnosis not present

## 2014-04-24 NOTE — Progress Notes (Signed)
Please see dictated note her today for weekly management.

## 2014-04-24 NOTE — Progress Notes (Signed)
Pamela Underwood has received 18 fractions to her right breast.  Note bright erythema of her right breast with a rash-like appearance in the upper inner portion of her tx field.  Note isolated areas of desquamation in the right axilla and in the inframmary fold.  Pamela Underwood reports tenderness in her right axilla, and she also reports fatigue

## 2014-04-24 NOTE — Progress Notes (Signed)
Weekly Management Note:  Site: Right breast Current Dose:  4500  cGy Projected Dose: 5250  cGy  Narrative: The patient is seen today for routine under treatment assessment. CBCT/MVCT images/port films were reviewed. The chart was reviewed.   She is without new complaints today except for mild discomfort along her right axilla.  She also has moderate fatigue.  She started her electron beam boost today.  Physical Examination: There were no vitals filed for this visit..  Weight:  .  There is dry desquamation along her right axilla and inframammary region with what appears to be impending moist desquamation along her right axilla.  Impression: Tolerating radiation therapy well.  She'll finish her radiation therapy this Thursday.  Plan: One-month follow-up visit after completion of radiation therapy.

## 2014-04-25 ENCOUNTER — Ambulatory Visit
Admission: RE | Admit: 2014-04-25 | Discharge: 2014-04-25 | Disposition: A | Discharge status: Home / Self Care | Payer: 59 | Source: Ambulatory Visit | Attending: Radiation Oncology | Admitting: Radiation Oncology

## 2014-04-25 DIAGNOSIS — Z510 Encounter for antineoplastic radiation therapy: Secondary | ICD-10-CM | POA: Diagnosis not present

## 2014-04-26 ENCOUNTER — Ambulatory Visit
Admission: RE | Admit: 2014-04-26 | Discharge: 2014-04-26 | Disposition: A | Discharge status: Home / Self Care | Payer: 59 | Source: Ambulatory Visit | Attending: Radiation Oncology | Admitting: Radiation Oncology

## 2014-04-26 DIAGNOSIS — Z510 Encounter for antineoplastic radiation therapy: Secondary | ICD-10-CM | POA: Diagnosis not present

## 2014-04-27 ENCOUNTER — Ambulatory Visit
Admission: RE | Admit: 2014-04-27 | Discharge: 2014-04-27 | Disposition: A | Discharge status: Home / Self Care | Payer: 59 | Source: Ambulatory Visit | Attending: Radiation Oncology | Admitting: Radiation Oncology

## 2014-04-27 DIAGNOSIS — C50211 Malignant neoplasm of upper-inner quadrant of right female breast: Secondary | ICD-10-CM

## 2014-04-27 DIAGNOSIS — Z510 Encounter for antineoplastic radiation therapy: Secondary | ICD-10-CM | POA: Diagnosis not present

## 2014-04-27 MED ORDER — RADIAPLEXRX EX GEL
Freq: Once | CUTANEOUS | Status: AC
  Administered 2014-04-27: 17:00:00 via TOPICAL

## 2014-04-29 ENCOUNTER — Encounter: Payer: Self-pay | Admitting: Radiation Oncology

## 2014-04-29 NOTE — Progress Notes (Signed)
[facility] End of Treatment Note  Name:Pamela Underwood  Date: 04/29/2014 WIO:[id number] DOB:April 20, 1956   Status:outpatient    CC: Allena Katz, MD  Dr. Rolm Bookbinder  REFERRING PHYSICIAN:   Dr. 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  Right breast boost with 15 MEV electrons.  Because of the closeness of her surgical margin to the surface, her right breast tumor bed was bolused every other day to increase the dose superficially.             NARRATIVE:   She tolerated treatment well although she had impending focal moist desquamation along the right inframammary region and axilla by completion of therapy.                         PLAN: Routine followup in one month. Patient instructed to call if questions or worsening complaints in interim.

## 2014-05-04 ENCOUNTER — Encounter: Payer: Self-pay | Admitting: Radiation Oncology

## 2014-05-04 NOTE — Progress Notes (Signed)
Simulation verification note: The patient underwent simulation verification on November 30 for her electron beam boost.  Her setup is excellent.

## 2014-05-11 ENCOUNTER — Other Ambulatory Visit (HOSPITAL_BASED_OUTPATIENT_CLINIC_OR_DEPARTMENT_OTHER): Payer: 59

## 2014-05-11 ENCOUNTER — Ambulatory Visit (HOSPITAL_BASED_OUTPATIENT_CLINIC_OR_DEPARTMENT_OTHER): Payer: 59 | Admitting: Oncology

## 2014-05-11 VITALS — BP 134/68 | HR 77 | Temp 98.4°F | Resp 18 | Ht 67.0 in | Wt 192.4 lb

## 2014-05-11 DIAGNOSIS — C50211 Malignant neoplasm of upper-inner quadrant of right female breast: Secondary | ICD-10-CM

## 2014-05-11 DIAGNOSIS — E669 Obesity, unspecified: Secondary | ICD-10-CM

## 2014-05-11 DIAGNOSIS — C50411 Malignant neoplasm of upper-outer quadrant of right female breast: Secondary | ICD-10-CM

## 2014-05-11 DIAGNOSIS — I471 Supraventricular tachycardia, unspecified: Secondary | ICD-10-CM

## 2014-05-11 DIAGNOSIS — G4733 Obstructive sleep apnea (adult) (pediatric): Secondary | ICD-10-CM

## 2014-05-11 DIAGNOSIS — Z170 Estrogen receptor positive status [ER+]: Secondary | ICD-10-CM

## 2014-05-11 LAB — COMPREHENSIVE METABOLIC PANEL (CC13)
ALK PHOS: 88 U/L (ref 40–150)
ALT: 21 U/L (ref 0–55)
AST: 17 U/L (ref 5–34)
Albumin: 4.1 g/dL (ref 3.5–5.0)
Anion Gap: 11 mEq/L (ref 3–11)
BILIRUBIN TOTAL: 0.38 mg/dL (ref 0.20–1.20)
BUN: 19.3 mg/dL (ref 7.0–26.0)
CO2: 27 mEq/L (ref 22–29)
Calcium: 9.2 mg/dL (ref 8.4–10.4)
Chloride: 102 mEq/L (ref 98–109)
Creatinine: 0.8 mg/dL (ref 0.6–1.1)
EGFR: 79 mL/min/{1.73_m2} — AB (ref >90)
Glucose: 105 mg/dl (ref 70–140)
Potassium: 3.8 mEq/L (ref 3.5–5.1)
SODIUM: 140 meq/L (ref 136–145)
TOTAL PROTEIN: 6.7 g/dL (ref 6.4–8.3)

## 2014-05-11 LAB — CBC WITH DIFFERENTIAL/PLATELET
BASO%: 0.5 % (ref 0.0–2.0)
Basophils Absolute: 0 10*3/uL (ref 0.0–0.1)
EOS ABS: 0.2 10*3/uL (ref 0.0–0.5)
EOS%: 2.1 % (ref 0.0–7.0)
HCT: 39.2 % (ref 34.8–46.6)
HGB: 13.2 g/dL (ref 11.6–15.9)
LYMPH%: 27.6 % (ref 14.0–49.7)
MCH: 31.9 pg (ref 25.1–34.0)
MCHC: 33.6 g/dL (ref 31.5–36.0)
MCV: 94.9 fL (ref 79.5–101.0)
MONO#: 0.8 10*3/uL (ref 0.1–0.9)
MONO%: 10 % (ref 0.0–14.0)
NEUT#: 5 10*3/uL (ref 1.5–6.5)
NEUT%: 59.8 % (ref 38.4–76.8)
PLATELETS: 192 10*3/uL (ref 145–400)
RBC: 4.13 10*6/uL (ref 3.70–5.45)
RDW: 12.6 % (ref 11.2–14.5)
WBC: 8.4 10*3/uL (ref 3.9–10.3)
lymph#: 2.3 10*3/uL (ref 0.9–3.3)

## 2014-05-11 NOTE — Progress Notes (Signed)
[place]  Telephone:([phone]) [phone] Fax:(336) [phone]     ID: Pamela Underwood DOB: 12-27-1955  MR#: [id number]  XQJ#:[id number]  Patient Care Team: Allena Katz, MD as PCP - General (Obstetrics and Gynecology) Jettie Booze, MD as Consulting Physician (Cardiology) Allena Katz, MD as Consulting Physician (Obstetrics and Gynecology) Chauncey Cruel, MD as Consulting Physician (Oncology) OTHER MD:  CHIEF COMPLAINT: Estrogen receptor positive breast cancer  CURRENT TREATMENT: To start adjuvant antiestrogen therapy   BREAST CANCER HISTORY: From the original intake note:  Tiya had routine screening mammography at [facility] 02/03/2014. This showed a focal asymmetry in the right breast and additional views on 02/06/2014 showed an irregular mass in the right breast, 11:00 position. Ultrasound the same day showed this to be 1.1 cm, with irregular margins, and hypoechoic. There was also an oval lymph nodes with focal cortical thickening in the right axilla. There was also, finally, a 1.1 cm oval mass with circumscribed margins in the right breast upper inner quadrant. Color flow imaging there showed no increase in vascularity.biopsy of the upper outer quadrant right breast mass showed invasive ductal carcinoma. The upper inner quadrant right breast lesion was a fibroadenoma versus a benign phyllodes tumor. The right axillary lymph node that was suspicious was negative for metastatic disease (results are taken from MRI dictation, as the pathology report is not in EPIC).  On 02/08/2014 the patient underwent bilateral breast MRIs. This showed, in the upper outer right breast an irregular enhancing mass measuring 1.4 cm. There were mildly prominent right axillary lymph nodes and bilateral nonspecific internal mammary lymph nodes measuring up to 4 mm.  On 02/22/2014 the patient underwent right lumpectomy for the upper inner quadrant lesion which proved to be a  fibroadenoma. On the same day right upper outer quadrant lumpectomy showed an invasive ductal carcinoma measuring 1.6 cm, in the setting of ductal carcinoma in situ. The invasive tumor was less than a millimeter from the anterior margin. Both sentinel lymph nodes were benign.  The patient's subsequent history is as detailed below  INTERVAL HISTORY: Annalei returns today for follow-up of her breast cancer. Since her last visit here, she completed her radiation treatments. She experienced some fatigue, but never severe. She is working full-time. She developed more of a rash right after completing the radiation, but this is now resolving. She is ready to start her antiestrogen treatment.   REVIEW OF SYSTEMS: We reviewed, and menopausal symptoms. Currently hot flashes are not a major issue and don't wake her up at night. She has mild vaginal dryness issues. She does complain of insomnia and occasionally takes Ambien for this. She has gained a few pounds in the last few months. She has not been able to get to the gym as she usually did. She's never had a bone density. She is not aware of any mood changes. Aside from these issues, she does have a history of occasional palpitations which have been diagnosed as paroxysmal atrial tachycardia. She is trying to avoid coffee. A detailed review of systems today was otherwise negative.  PAST MEDICAL HISTORY: Past Medical History  Diagnosis Date  . Insomnia     takes Ambien nightly as needed  . Atrial tachycardia     takes rythmol daily and cardiazem daily  . Asthma     as a child  . Sleep apnea     uses a CPAP  . Joint pain   . Joint swelling   . Back pain  occasionally  . Colitis     hx-one  . Chronic anxiety     takes Xanax daily as needed when heart rate going up  . Dysrhythmia     atrial tachycardia- takes meds and controls it  . Breast cancer 02/22/14    right upper inner    PAST SURGICAL HISTORY: Past Surgical History  Procedure  Laterality Date  . Tubal ligation    . Colonoscopy    . Knee arthroscopy with medial menisectomy Left 03/31/2013    Procedure: LEFT KNEE ARTHROSCOPY WITH PARTIAL MEDIAL MENISECTOMY, CHONDROPLASTY OF PATELLA  FEMORAL JOINT, EXCISION OF MEDIAL PLICA, PARTIAL LATERAL MENISECTOMY;  Surgeon: [name], MD;  Location: [facility];  Service: Orthopedics;  Laterality: Left;  . Breast biopsy Left 05/02/1999    benign  . Cystoscopy    . Total knee arthroplasty Left 06/29/2013    Procedure: TOTAL KNEE ARTHROPLASTY;  Surgeon: [name], MD;  Location: [facility];  Service: Orthopedics;  Laterality: Left;  . Breast lumpectomy with axillary lymph node biopsy Right 02/22/14    upper inner    FAMILY HISTORY Family History  Problem Relation Age of Onset  . Heart disease Father   . Hypertension Father    the patient's father died from a myocardial infarction in his 83s. The patient's mother also died in her 4s, after her an accidental fire exposure. The patient had one sister, no brothers. There is no history of breast or ovarian cancer in the family  GYNECOLOGIC HISTORY:  No LMP recorded. Patient is postmenopausal. Menarche age 22, first live birth age 62, the patient is GX P3. She stopped having periods at the age of 58. She did not take hormone replacement.  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 Her husband Ronalee Belts is vice Radio producer for a [employer]. Daughter Vida Roller works as a Marine scientist in [place]. Daughter Florentina Jenny teaches kindergarten in [place]. Son Selinda Flavin is Facilities manager in [place]. The patient has no grandchildren. She attends a [employer]    ADVANCED DIRECTIVES: Not in place   HEALTH MAINTENANCE: History  Substance Use Topics  . Smoking status: Never Smoker   . Smokeless tobacco: Never Used  . Alcohol Use: Yes     Comment: socailly     Colonoscopy:  PAP:  Bone density:  Lipid panel:  Allergies  Allergen Reactions    . Escitalopram Oxalate Other (See Comments)    Hives, welts, all over skin rash    Current Outpatient Prescriptions  Medication Sig Dispense Refill  . ALPRAZolam (XANAX) 0.25 MG tablet   4  . aspirin 81 MG tablet Take 81 mg by mouth daily.    Marland Kitchen diltiazem (CARDIZEM CD) 120 MG 24 hr capsule TAKE 1 CAPSULE BY MOUTH ONCE DAILY 30 capsule 9  . hyaluronate sodium (RADIAPLEXRX) GEL Apply 1 application topically 2 (two) times daily. Apply to breast area after rad tx and in am, just not 4 hours prior to rad txs daily    . non-metallic deodorant (ALRA) MISC Apply 1 application topically daily. Apply after rad tx daily and prn,not 4 hours prior to treatment though    . propafenone (RYTHMOL SR) 225 MG 12 hr capsule TAKE 1 CAPSULE BY MOUTH EVERY 12 HOURS 60 capsule 2  . VITAMIN D, CHOLECALCIFEROL, PO Take 1 tablet by mouth daily.     Marland Kitchen zolpidem (AMBIEN) 10 MG tablet Take 5-10 mg by mouth at bedtime as needed. For  sleep      No current facility-administered medications for this visit.    OBJECTIVE: Middle-aged white woman in no acute distress Filed Vitals:   05/11/14 1618  BP: 134/68  Pulse: 77  Temp: 98.4 F (36.9 C)  Resp: 18     Body mass index is 30.13 kg/(m^2).    ECOG FS:0 - Asymptomatic  Sclerae unicteric, pupils equal and reactive Oropharynx clear, teeth in good repair No cervical or supraclavicular adenopathy Lungs no rales or rhonchi Heart regular rate and rhythm Abd soft, nontender, positive bowel sounds MSK no focal spinal tenderness, no upper extremity lymphedema Neuro: nonfocal, well oriented, appropriate affect Breasts: The right breast is unremarkable. The left breast is status post lumpectomy and radiation. There is still a mild blush and there is some dry desquamation anteriorly. There is no palpable mass. The cosmetic result is excellent. The left axilla is benign.  LAB RESULTS:  CMP     Component Value Date/Time   NA 140 05/11/2014 1556   NA 140 02/20/2014 0930    K 3.8 05/11/2014 1556   K 4.8 02/20/2014 0930   CL 101 02/20/2014 0930   CO2 27 05/11/2014 1556   CO2 28 02/20/2014 0930   GLUCOSE 105 05/11/2014 1556   GLUCOSE 104* 02/20/2014 0930   BUN 19.3 05/11/2014 1556   BUN 14 02/20/2014 0930   CREATININE 0.8 05/11/2014 1556   CREATININE 0.72 02/20/2014 0930   CALCIUM 9.2 05/11/2014 1556   CALCIUM 9.5 02/20/2014 0930   PROT 6.7 05/11/2014 1556   PROT 7.3 06/27/2013 1403   ALBUMIN 4.1 05/11/2014 1556   ALBUMIN 4.3 06/27/2013 1403   AST 17 05/11/2014 1556   AST 19 06/27/2013 1403   ALT 21 05/11/2014 1556   ALT 24 06/27/2013 1403   ALKPHOS 88 05/11/2014 1556   ALKPHOS 75 06/27/2013 1403   BILITOT 0.38 05/11/2014 1556   BILITOT 0.4 06/27/2013 1403   GFRNONAA >90 02/20/2014 0930   GFRAA >90 02/20/2014 0930    I No results found for: SPEP  Lab Results  Component Value Date   WBC 8.4 05/11/2014   NEUTROABS 5.0 05/11/2014   HGB 13.2 05/11/2014   HCT 39.2 05/11/2014   MCV 94.9 05/11/2014   PLT 192 05/11/2014      Chemistry      Component Value Date/Time   NA 140 05/11/2014 1556   NA 140 02/20/2014 0930   K 3.8 05/11/2014 1556   K 4.8 02/20/2014 0930   CL 101 02/20/2014 0930   CO2 27 05/11/2014 1556   CO2 28 02/20/2014 0930   BUN 19.3 05/11/2014 1556   BUN 14 02/20/2014 0930   CREATININE 0.8 05/11/2014 1556   CREATININE 0.72 02/20/2014 0930      Component Value Date/Time   CALCIUM 9.2 05/11/2014 1556   CALCIUM 9.5 02/20/2014 0930   ALKPHOS 88 05/11/2014 1556   ALKPHOS 75 06/27/2013 1403   AST 17 05/11/2014 1556   AST 19 06/27/2013 1403   ALT 21 05/11/2014 1556   ALT 24 06/27/2013 1403   BILITOT 0.38 05/11/2014 1556   BILITOT 0.4 06/27/2013 1403       No results found for: LABCA2  No components found for: LABCA125  No results for input(s): INR in the last 168 hours.  Urinalysis    Component Value Date/Time   COLORURINE YELLOW 06/27/2013 1401   APPEARANCEUR CLEAR 06/27/2013 1401   LABSPEC 1.026 06/27/2013  1401   PHURINE 5.0 06/27/2013 1401   GLUCOSEU NEGATIVE  06/27/2013 1401   HGBUR MODERATE* 06/27/2013 1401   BILIRUBINUR NEGATIVE 06/27/2013 1401   KETONESUR NEGATIVE 06/27/2013 1401   PROTEINUR NEGATIVE 06/27/2013 1401   UROBILINOGEN 0.2 06/27/2013 1401   NITRITE NEGATIVE 06/27/2013 1401   LEUKOCYTESUR NEGATIVE 06/27/2013 1401    STUDIES: No results found.  ASSESSMENT: 58 y.o. [place] woman status post left lumpectomy and sentinel lymph node sampling 02/22/2014 for a pT1c pN0, stage IA invasive ductal carcinoma, grade 1 estrogen receptor 100% positive, progesterone receptor 85% positive, with no HER-2 amplification and an MIB-1 of 12%  (1) OncotypeDX 02/28/2014 score of 8 predicts an outside-the-breast recurrence of 6% if the patient's only systemic treatment is tamoxifen for 5 years. It also predicts no benefit from chemotherapy  (2) completed adjuvant radiation 04/27/2014: 4250 cGy to the right breast with a 1000 cGy boost to the scar  (3) to start antiestrogen therapy January 2015  PLAN: Raynelle has completed the local treatment for her early stage breast cancer. We are now ready to move to systemic treatment. Obviously she is not a candidate for anti-her to immunotherapy. Her Oncotype also leads this away from chemotherapy as a form of systemic treatment.  We then discussed antiestrogen's in detail. She understands the possible benefits as well as the toxicities, side effects and complications of tamoxifen as compared to anastrozole.  After much discussion what Jyra would like to do is have a lipid panel and bone density test up obtained. Based on those results she may choose to start on anastrozole and finish in 5 years, or go on tamoxifen and continue for total of 10 years. Either of those options would give her similar optimal results  Zyra has a good understanding of the overall plan. She agrees with it. She knows the goal of treatment in her case is cure. She will call  with any problems that may develop before her next visit here, which will be 2 or 3 months after the start of antiestrogen therapy, to assess tolerance.   Chauncey Cruel, MD   05/12/2014 8:20 AM

## 2014-05-12 ENCOUNTER — Other Ambulatory Visit: Payer: Self-pay | Admitting: Oncology

## 2014-05-12 ENCOUNTER — Telehealth: Payer: Self-pay | Admitting: Oncology

## 2014-05-12 NOTE — Addendum Note (Signed)
Addended by: Laureen Abrahams on: 05/12/2014 05:48 PM   Modules accepted: Medications

## 2014-05-12 NOTE — Telephone Encounter (Signed)
per pof to sch pt appt-cld & left pt message of appt time & date-per pof GM made DEXA appt himself @ [facility]

## 2014-05-17 ENCOUNTER — Other Ambulatory Visit: Payer: Self-pay | Admitting: Interventional Cardiology

## 2014-05-22 ENCOUNTER — Encounter: Payer: Self-pay | Admitting: Radiation Oncology

## 2014-05-30 ENCOUNTER — Ambulatory Visit
Admission: RE | Admit: 2014-05-30 | Discharge: 2014-05-30 | Disposition: A | Discharge status: Home / Self Care | Payer: 59 | Source: Ambulatory Visit | Attending: Radiation Oncology | Admitting: Radiation Oncology

## 2014-05-30 ENCOUNTER — Encounter: Payer: Self-pay | Admitting: Radiation Oncology

## 2014-05-30 VITALS — BP 137/73 | HR 91 | Temp 98.4°F | Resp 20

## 2014-05-30 DIAGNOSIS — C50211 Malignant neoplasm of upper-inner quadrant of right female breast: Secondary | ICD-10-CM

## 2014-05-30 HISTORY — DX: Personal history of irradiation: Z92.3

## 2014-05-30 NOTE — Progress Notes (Signed)
Patient denies pain, loss of appetite, states her fatigue has resolved. She states she has "one area of dark skin in her right axilla", otherwise her skin has healed. FU with Dr Jana Hakim 08/17/14.

## 2014-05-30 NOTE — Progress Notes (Signed)
CC: Dr. Rolm Bookbinder  Follow-up note:  Pamela Underwood returns today approximately 1 month following completion of radiation therapy following conservative surgery in the management of her T1 N0 invasive ductal carcinoma/DCIS of the right breast.  She is waiting to hear back regarding her bone density study at [facility] before she and Pamela Underwood make a decision regarding choice of adjuvant hormone therapy.  She will see Pamela Underwood for a follow-up visit in March, and see Pamela Underwood for a follow-up visit this summer.  Physical examination: Alert and oriented. Filed Vitals:   05/30/14 1126  BP: 137/73  Pulse: 91  Temp: 98.4 F (36.9 C)  Resp: 20   Nodes: There is no palpable cervical, supraclavicular, or axillary lymphadenopathy.  Breasts: There is residual hyperpigmentation/erythema of the skin along the right breast.  No masses are appreciated.  Left breast without masses or lesions.  Extremities: Without edema.  Impression: Satisfactory progress.  She will start adjuvant antiestrogen therapy this winter.  Plan: She will see Pamela Underwood in March, and Pamela Underwood this summer.  She should have follow-up mammography no later than this September at [facility].  She'll maintain her oncology follow-up through Pamela Underwood and Pamela Underwood.

## 2014-06-02 ENCOUNTER — Other Ambulatory Visit: Payer: Self-pay | Admitting: *Deleted

## 2014-06-02 DIAGNOSIS — Z13220 Encounter for screening for lipoid disorders: Secondary | ICD-10-CM

## 2014-06-02 DIAGNOSIS — C50211 Malignant neoplasm of upper-inner quadrant of right female breast: Secondary | ICD-10-CM

## 2014-06-06 ENCOUNTER — Telehealth: Payer: Self-pay | Admitting: Oncology

## 2014-06-06 NOTE — Telephone Encounter (Signed)
pt cld left vm-wanted lab appt-left pt time & date for both appts for 3/24

## 2014-06-07 ENCOUNTER — Other Ambulatory Visit: Payer: Self-pay | Admitting: *Deleted

## 2014-06-08 ENCOUNTER — Telehealth: Payer: Self-pay | Admitting: Oncology

## 2014-06-08 ENCOUNTER — Other Ambulatory Visit: Payer: Self-pay | Admitting: *Deleted

## 2014-06-08 NOTE — Telephone Encounter (Signed)
, °

## 2014-06-09 ENCOUNTER — Other Ambulatory Visit: Payer: Self-pay | Admitting: *Deleted

## 2014-06-09 NOTE — Progress Notes (Signed)
This RN called to [facility] and requested copy of bone density performed in Dec 2015.  Note above done with copy to Dr Gertie Fey but not to Dr Jana Hakim.  Lab appointment not scheduled - per note entered by Webb Silversmith- pt to call next week to schedule.

## 2014-06-12 ENCOUNTER — Other Ambulatory Visit (HOSPITAL_BASED_OUTPATIENT_CLINIC_OR_DEPARTMENT_OTHER): Payer: 59

## 2014-06-12 DIAGNOSIS — C50211 Malignant neoplasm of upper-inner quadrant of right female breast: Secondary | ICD-10-CM

## 2014-06-12 DIAGNOSIS — G4733 Obstructive sleep apnea (adult) (pediatric): Secondary | ICD-10-CM

## 2014-06-12 DIAGNOSIS — C50411 Malignant neoplasm of upper-outer quadrant of right female breast: Secondary | ICD-10-CM | POA: Diagnosis not present

## 2014-06-12 DIAGNOSIS — Z13220 Encounter for screening for lipoid disorders: Secondary | ICD-10-CM

## 2014-06-12 DIAGNOSIS — E669 Obesity, unspecified: Secondary | ICD-10-CM

## 2014-06-12 DIAGNOSIS — I471 Supraventricular tachycardia: Secondary | ICD-10-CM

## 2014-06-12 LAB — CBC WITH DIFFERENTIAL/PLATELET
BASO%: 0.3 % (ref 0.0–2.0)
BASOS ABS: 0 10*3/uL (ref 0.0–0.1)
EOS%: 3.5 % (ref 0.0–7.0)
Eosinophils Absolute: 0.2 10*3/uL (ref 0.0–0.5)
HCT: 44.2 % (ref 34.8–46.6)
HGB: 15.6 g/dL (ref 11.6–15.9)
LYMPH#: 1.7 10*3/uL (ref 0.9–3.3)
LYMPH%: 26.3 % (ref 14.0–49.7)
MCH: 32.6 pg (ref 25.1–34.0)
MCHC: 35.3 g/dL (ref 31.5–36.0)
MCV: 92.3 fL (ref 79.5–101.0)
MONO#: 0.6 10*3/uL (ref 0.1–0.9)
MONO%: 9.8 % (ref 0.0–14.0)
NEUT%: 60.1 % (ref 38.4–76.8)
NEUTROS ABS: 3.9 10*3/uL (ref 1.5–6.5)
Platelets: 184 10*3/uL (ref 145–400)
RBC: 4.79 10*6/uL (ref 3.70–5.45)
RDW: 12.4 % (ref 11.2–14.5)
WBC: 6.5 10*3/uL (ref 3.9–10.3)

## 2014-06-12 LAB — COMPREHENSIVE METABOLIC PANEL (CC13)
ALBUMIN: 4.5 g/dL (ref 3.5–5.0)
ALK PHOS: 96 U/L (ref 40–150)
ALT: 23 U/L (ref 0–55)
AST: 20 U/L (ref 5–34)
Anion Gap: 12 mEq/L — ABNORMAL HIGH (ref 3–11)
BUN: 14.7 mg/dL (ref 7.0–26.0)
CO2: 27 mEq/L (ref 22–29)
Calcium: 9.4 mg/dL (ref 8.4–10.4)
Chloride: 104 mEq/L (ref 98–109)
Creatinine: 0.9 mg/dL (ref 0.6–1.1)
EGFR: 76 mL/min/{1.73_m2} — AB (ref >90)
Glucose: 129 mg/dl (ref 70–140)
POTASSIUM: 4.6 meq/L (ref 3.5–5.1)
Sodium: 143 mEq/L (ref 136–145)
Total Bilirubin: 1.09 mg/dL (ref 0.20–1.20)
Total Protein: 7.6 g/dL (ref 6.4–8.3)

## 2014-06-12 LAB — LIPID PANEL
Cholesterol: 254 mg/dL — ABNORMAL HIGH (ref 0–200)
HDL: 84 mg/dL (ref >39)
LDL Cholesterol: 153 mg/dL — ABNORMAL HIGH (ref 0–99)
Total CHOL/HDL Ratio: 3 Ratio
Triglycerides: 84 mg/dL (ref <150)
VLDL: 17 mg/dL (ref 0–40)

## 2014-06-19 ENCOUNTER — Telehealth: Payer: Self-pay | Admitting: *Deleted

## 2014-06-19 ENCOUNTER — Telehealth: Payer: Self-pay | Admitting: Interventional Cardiology

## 2014-06-19 NOTE — Telephone Encounter (Signed)
Pt called to state concern with results of cholesterol and starting the aromatase inhibitor.  She states she would prefer to be on the tamoxifen " even if it means for 10 years "  Pt also inquired " should I be on a cholesterol medication too and could Dr Jannifer Rodney prescribe it as well "  This RN informed pt above medication is best prescribed and followed by a primary MD. Pt states she does not have a primary MD but sees a cardiologist for her known A fib- a GYN and Dr Jana Hakim.  This note will be given to MD upon return to the office for appropriate recommendation and medication orders.

## 2014-06-19 NOTE — Telephone Encounter (Signed)
New message      Pt got her cholesterol drawn at the cancer center.  She want you to get the results and ask Dr V if he want her to be put on cholesterol medication

## 2014-06-20 NOTE — Telephone Encounter (Signed)
Spoke with pt and informed her of Dr. Hassell Done suggestions. Informed her that her LDL was about the same from a few years ago and that her HDL is very high. Informed pt to continue with diet control and exercise and that Dr. Irish Lack said that he would not start statin at this time. Pt verbalized understanding and was in agreement with this plan.

## 2014-06-20 NOTE — Telephone Encounter (Signed)
LDL about the same as it was a few years ago.  HDL very high, which is good.  COntinue diet control and exercise.  Would not start statin at this time.

## 2014-06-30 ENCOUNTER — Other Ambulatory Visit: Payer: Self-pay | Admitting: *Deleted

## 2014-06-30 DIAGNOSIS — C50211 Malignant neoplasm of upper-inner quadrant of right female breast: Secondary | ICD-10-CM

## 2014-06-30 MED ORDER — TAMOXIFEN CITRATE 20 MG PO TABS: 20.0000 mg | ORAL_TABLET | Freq: Every day | ORAL | Status: DC

## 2014-07-03 ENCOUNTER — Other Ambulatory Visit: Payer: Self-pay

## 2014-07-03 ENCOUNTER — Telehealth: Payer: Self-pay | Admitting: Oncology

## 2014-07-03 DIAGNOSIS — C50211 Malignant neoplasm of upper-inner quadrant of right female breast: Secondary | ICD-10-CM

## 2014-07-03 MED ORDER — ROSUVASTATIN CALCIUM 5 MG PO TABS: 5.0000 mg | ORAL_TABLET | Freq: Every day | ORAL | Status: DC

## 2014-07-03 NOTE — Telephone Encounter (Signed)
, °

## 2014-08-17 ENCOUNTER — Other Ambulatory Visit (HOSPITAL_BASED_OUTPATIENT_CLINIC_OR_DEPARTMENT_OTHER): Payer: 59

## 2014-08-17 ENCOUNTER — Ambulatory Visit (HOSPITAL_BASED_OUTPATIENT_CLINIC_OR_DEPARTMENT_OTHER): Payer: 59 | Admitting: Oncology

## 2014-08-17 VITALS — BP 155/63 | HR 79 | Temp 98.2°F | Resp 18 | Ht 67.0 in | Wt 191.4 lb

## 2014-08-17 DIAGNOSIS — C50411 Malignant neoplasm of upper-outer quadrant of right female breast: Secondary | ICD-10-CM | POA: Diagnosis not present

## 2014-08-17 DIAGNOSIS — G4733 Obstructive sleep apnea (adult) (pediatric): Secondary | ICD-10-CM

## 2014-08-17 DIAGNOSIS — E7800 Pure hypercholesterolemia, unspecified: Secondary | ICD-10-CM

## 2014-08-17 DIAGNOSIS — E669 Obesity, unspecified: Secondary | ICD-10-CM

## 2014-08-17 DIAGNOSIS — E7889 Other lipoprotein metabolism disorders: Secondary | ICD-10-CM

## 2014-08-17 DIAGNOSIS — C50211 Malignant neoplasm of upper-inner quadrant of right female breast: Secondary | ICD-10-CM

## 2014-08-17 DIAGNOSIS — I471 Supraventricular tachycardia, unspecified: Secondary | ICD-10-CM

## 2014-08-17 DIAGNOSIS — Z170 Estrogen receptor positive status [ER+]: Secondary | ICD-10-CM | POA: Diagnosis not present

## 2014-08-17 LAB — CBC WITH DIFFERENTIAL/PLATELET
BASO%: 1.1 % (ref 0.0–2.0)
Basophils Absolute: 0.1 10*3/uL (ref 0.0–0.1)
EOS ABS: 0.3 10*3/uL (ref 0.0–0.5)
EOS%: 3.1 % (ref 0.0–7.0)
HEMATOCRIT: 38.2 % (ref 34.8–46.6)
HGB: 13.2 g/dL (ref 11.6–15.9)
LYMPH#: 2.8 10*3/uL (ref 0.9–3.3)
LYMPH%: 34.6 % (ref 14.0–49.7)
MCH: 32.2 pg (ref 25.1–34.0)
MCHC: 34.4 g/dL (ref 31.5–36.0)
MCV: 93.4 fL (ref 79.5–101.0)
MONO#: 0.6 10*3/uL (ref 0.1–0.9)
MONO%: 7.6 % (ref 0.0–14.0)
NEUT#: 4.4 10*3/uL (ref 1.5–6.5)
NEUT%: 53.6 % (ref 38.4–76.8)
Platelets: 199 10*3/uL (ref 145–400)
RBC: 4.09 10*6/uL (ref 3.70–5.45)
RDW: 12.5 % (ref 11.2–14.5)
WBC: 8.1 10*3/uL (ref 3.9–10.3)

## 2014-08-17 LAB — COMPREHENSIVE METABOLIC PANEL (CC13)
ALT: 16 U/L (ref 0–55)
AST: 17 U/L (ref 5–34)
Albumin: 4.1 g/dL (ref 3.5–5.0)
Alkaline Phosphatase: 70 U/L (ref 40–150)
Anion Gap: 10 mEq/L (ref 3–11)
BILIRUBIN TOTAL: 0.51 mg/dL (ref 0.20–1.20)
BUN: 23 mg/dL (ref 7.0–26.0)
CO2: 24 mEq/L (ref 22–29)
Calcium: 8.8 mg/dL (ref 8.4–10.4)
Chloride: 107 mEq/L (ref 98–109)
Creatinine: 0.8 mg/dL (ref 0.6–1.1)
EGFR: 85 mL/min/{1.73_m2} — AB (ref >90)
GLUCOSE: 98 mg/dL (ref 70–140)
Potassium: 3.8 mEq/L (ref 3.5–5.1)
SODIUM: 141 meq/L (ref 136–145)
Total Protein: 6.8 g/dL (ref 6.4–8.3)

## 2014-08-17 NOTE — Progress Notes (Signed)
[place]  Telephone:([phone]) [phone] [phone]) [phone]     ID: Pamela Underwood DOB: July 30, 1955  MR#: [id number]  PYK#:[id number]  Patient Care Team: Everlene Farrier, MD as PCP - General (Obstetrics and Gynecology) Jettie Booze, MD as Consulting Physician (Cardiology) Everlene Farrier, MD as Consulting Physician (Obstetrics and Gynecology) Chauncey Cruel, MD as Consulting Physician (Oncology) OTHER MD: Rolm Bookbinder M.D., Arloa Koh M.D.  CHIEF COMPLAINT: Estrogen receptor positive breast cancer  CURRENT TREATMENT: To start adjuvant antiestrogen therapy   BREAST CANCER HISTORY: From the original intake note:  Pamela Underwood had routine screening mammography at [facility] 02/03/2014. This showed a focal asymmetry in the right breast and additional views on 02/06/2014 showed an irregular mass in the right breast, 11:00 position. Ultrasound the same day showed this to be 1.1 cm, with irregular margins, and hypoechoic. There was also an oval lymph nodes with focal cortical thickening in the right axilla. There was also, finally, a 1.1 cm oval mass with circumscribed margins in the right breast upper inner quadrant. Color flow imaging there showed no increase in vascularity.biopsy of the upper outer quadrant right breast mass showed invasive ductal carcinoma. The upper inner quadrant right breast lesion was a fibroadenoma versus a benign phyllodes tumor. The right axillary lymph node that was suspicious was negative for metastatic disease (results are taken from MRI dictation, as the pathology report is not in EPIC).  On 02/08/2014 the patient underwent bilateral breast MRIs. This showed, in the upper outer right breast an irregular enhancing mass measuring 1.4 cm. There were mildly prominent right axillary lymph nodes and bilateral nonspecific internal mammary lymph nodes measuring up to 4 mm.  On 02/22/2014 the patient underwent right lumpectomy for the upper inner quadrant  lesion which proved to be a fibroadenoma. On the same day right upper outer quadrant lumpectomy showed an invasive ductal carcinoma measuring 1.6 cm, in the setting of ductal carcinoma in situ. The invasive tumor was less than a millimeter from the anterior margin. Both sentinel lymph nodes were benign.  The patient's subsequent history is as detailed below  INTERVAL HISTORY: Pamela Underwood returns today for follow-up of her breast cancer. She started tamoxifen in mid January. She has not noted any increase in hot flashes and has not no problems with vaginal wetness. She has had a sensation in her lower pelvis as if she were going to have a., But "not as bad as the premenstrual cramps IUs to have". This is actually very mild and subtle. Aside from that she is tolerating tamoxifen fine and she is obtaining it at no cost.  REVIEW OF SYSTEMS: She is back to work. She has some fatigue but "that's part of it". A detailed review of systems today was otherwise noncontributory  PAST MEDICAL HISTORY: Past Medical History  Diagnosis Date  . Insomnia     takes Ambien nightly as needed  . Atrial tachycardia     takes rythmol daily and cardiazem daily  . Asthma     as a child  . Sleep apnea     uses a CPAP  . Joint pain   . Joint swelling   . Back pain     occasionally  . Colitis     hx-one  . Chronic anxiety     takes Xanax daily as needed when heart rate going up  . Dysrhythmia     atrial tachycardia- takes meds and controls it  . Breast cancer 02/22/14    right upper inner  .  S/P radiation therapy 03/28/2014 through 04/27/2014                                03/28/2014 through 04/27/2014                                                                          Right breast 4250 cGy 17 sessions, right breast boost 1000 cGy in 4 sessions (hypofractionated)                           PAST SURGICAL HISTORY: Past Surgical History  Procedure Laterality Date  . Tubal ligation    . Colonoscopy    . Knee  arthroscopy with medial menisectomy Left 03/31/2013    Procedure: LEFT KNEE ARTHROSCOPY WITH PARTIAL MEDIAL MENISECTOMY, CHONDROPLASTY OF PATELLA  FEMORAL JOINT, EXCISION OF MEDIAL PLICA, PARTIAL LATERAL MENISECTOMY;  Surgeon: [name], MD;  Location: [facility];  Service: Orthopedics;  Laterality: Left;  . Breast biopsy Left 05/02/1999    benign  . Cystoscopy    . Total knee arthroplasty Left 06/29/2013    Procedure: TOTAL KNEE ARTHROPLASTY;  Surgeon: [name], MD;  Location: [facility];  Service: Orthopedics;  Laterality: Left;  . Breast lumpectomy with axillary lymph node biopsy Right 02/22/14    upper inner    FAMILY HISTORY Family History  Problem Relation Age of Onset  . Heart disease Father   . Hypertension Father    the patient's father died from a myocardial infarction in his 76s. The patient's mother also died in her 25s, after her an accidental fire exposure. The patient had one sister, no brothers. There is no history of breast or ovarian cancer in the family  GYNECOLOGIC HISTORY:  No LMP recorded. Patient is postmenopausal. Menarche age 51, first live birth age 78, the patient is GX P3. She stopped having periods at the age of 34. She did not take hormone replacement.  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 Her husband Pamela Underwood is vice Radio producer for a [employer]. Daughter Pamela Underwood works as a Marine scientist in [place]. Daughter Pamela Underwood teaches kindergarten in [place]. Son Pamela Underwood is Facilities manager in [place]. The patient has no grandchildren. She attends a [employer]    ADVANCED DIRECTIVES: Not in place   HEALTH MAINTENANCE: History  Substance Use Topics  . Smoking status: Never Smoker   . Smokeless tobacco: Never Used  . Alcohol Use: Yes     Comment: socailly     Colonoscopy:  PAP:  Bone density:  Lipid panel:  Allergies  Allergen Reactions  . Escitalopram Oxalate Other (See Comments)    Hives, welts,  all over skin rash    Current Outpatient Prescriptions  Medication Sig Dispense Refill  . ALPRAZolam (XANAX) 0.25 MG tablet   4  . aspirin 81 MG tablet Take 81 mg by mouth daily.    Marland Kitchen diltiazem (CARDIZEM CD) 120 MG 24 hr capsule TAKE 1 CAPSULE BY MOUTH ONCE DAILY 30 capsule 9  . non-metallic deodorant (ALRA) MISC Apply 1 application topically daily. Apply after rad tx daily  and prn,not 4 hours prior to treatment though    . propafenone (RYTHMOL SR) 225 MG 12 hr capsule TAKE 1 CAPSULE BY MOUTH EVERY 12 HOURS 60 capsule 5  . rosuvastatin (CRESTOR) 5 MG tablet Take 1 tablet (5 mg total) by mouth daily. 30 tablet 2  . tamoxifen (NOLVADEX) 20 MG tablet Take 1 tablet (20 mg total) by mouth daily. 90 tablet 3  . VITAMIN D, CHOLECALCIFEROL, PO Take 1 tablet by mouth daily.     Marland Kitchen zolpidem (AMBIEN) 10 MG tablet Take 5-10 mg by mouth at bedtime as needed. For sleep      No current facility-administered medications for this visit.    OBJECTIVE: Middle-aged white woman who appears younger than stated age 59 Vitals:   08/17/14 1559  BP: 155/63  Pulse: 79  Temp: 98.2 F (36.8 C)  Resp: 18     Body mass index is 29.97 kg/(m^2).    ECOG FS:0 - Asymptomatic  Sclerae unicteric, pupils equal and reactive Oropharynx clear and moist-- no thrush No cervical or supraclavicular adenopathy Lungs no rales or rhonchi Heart regular rate and rhythm Abd soft, nontender, positive bowel sounds MSK no focal spinal tenderness, no upper extremity lymphedema Neuro: nonfocal, well oriented, appropriate affect Breasts: The right breast is status post lumpectomy and radiation. There is a minimal "blush" residual from the radiation. There are no suspicious masses. The cosmetic result is excellent. The right breast is minimally smaller than the left. The right axilla is benign. The left breast is unremarkable.   LAB RESULTS:  CMP     Component Value Date/Time   NA 143 06/12/2014 0837   NA 140 02/20/2014 0930     K 4.6 06/12/2014 0837   K 4.8 02/20/2014 0930   CL 101 02/20/2014 0930   CO2 27 06/12/2014 0837   CO2 28 02/20/2014 0930   GLUCOSE 129 06/12/2014 0837   GLUCOSE 104* 02/20/2014 0930   BUN 14.7 06/12/2014 0837   BUN 14 02/20/2014 0930   CREATININE 0.9 06/12/2014 0837   CREATININE 0.72 02/20/2014 0930   CALCIUM 9.4 06/12/2014 0837   CALCIUM 9.5 02/20/2014 0930   PROT 7.6 06/12/2014 0837   PROT 7.3 06/27/2013 1403   ALBUMIN 4.5 06/12/2014 0837   ALBUMIN 4.3 06/27/2013 1403   AST 20 06/12/2014 0837   AST 19 06/27/2013 1403   ALT 23 06/12/2014 0837   ALT 24 06/27/2013 1403   ALKPHOS 96 06/12/2014 0837   ALKPHOS 75 06/27/2013 1403   BILITOT 1.09 06/12/2014 0837   BILITOT 0.4 06/27/2013 1403   GFRNONAA >90 02/20/2014 0930   GFRAA >90 02/20/2014 0930    I No results found for: SPEP  Lab Results  Component Value Date   WBC 8.1 08/17/2014   NEUTROABS 4.4 08/17/2014   HGB 13.2 08/17/2014   HCT 38.2 08/17/2014   MCV 93.4 08/17/2014   PLT 199 08/17/2014      Chemistry      Component Value Date/Time   NA 143 06/12/2014 0837   NA 140 02/20/2014 0930   K 4.6 06/12/2014 0837   K 4.8 02/20/2014 0930   CL 101 02/20/2014 0930   CO2 27 06/12/2014 0837   CO2 28 02/20/2014 0930   BUN 14.7 06/12/2014 0837   BUN 14 02/20/2014 0930   CREATININE 0.9 06/12/2014 0837   CREATININE 0.72 02/20/2014 0930      Component Value Date/Time   CALCIUM 9.4 06/12/2014 0837   CALCIUM 9.5 02/20/2014 0930   ALKPHOS  96 06/12/2014 0837   ALKPHOS 75 06/27/2013 1403   AST 20 06/12/2014 0837   AST 19 06/27/2013 1403   ALT 23 06/12/2014 0837   ALT 24 06/27/2013 1403   BILITOT 1.09 06/12/2014 0837   BILITOT 0.4 06/27/2013 1403       No results found for: LABCA2  No components found for: WERXV400  No results for input(s): INR in the last 168 hours.  Urinalysis    Component Value Date/Time   COLORURINE YELLOW 06/27/2013 1401   APPEARANCEUR CLEAR 06/27/2013 1401   LABSPEC 1.026  06/27/2013 1401   PHURINE 5.0 06/27/2013 1401   GLUCOSEU NEGATIVE 06/27/2013 1401   HGBUR MODERATE* 06/27/2013 1401   BILIRUBINUR NEGATIVE 06/27/2013 1401   KETONESUR NEGATIVE 06/27/2013 1401   PROTEINUR NEGATIVE 06/27/2013 1401   UROBILINOGEN 0.2 06/27/2013 1401   NITRITE NEGATIVE 06/27/2013 1401   LEUKOCYTESUR NEGATIVE 06/27/2013 1401  Results for HANIYYAH, SAKUMA (MRN [id number]) as of 08/17/2014 16:25  Ref. Range 06/12/2014 08:37  Cholesterol Latest Range: 0-200 mg/dL 254 (H)  Triglycerides Latest Range: <150 mg/dL 84  HDL Latest Range: >39 mg/dL 84  LDL (calc) Latest Range: 0-99 mg/dL 153 (H)  VLDL Latest Range: 0-40 mg/dL 17  Total CHOL/HDL Ratio Latest Units: Ratio 3.0    STUDIES: Bone density at [facility] 05/15/2014 was normal  ASSESSMENT: 59 y.o. [place] woman status post right lumpectomy and sentinel lymph node sampling 02/22/2014 for a pT1c pN0, stage IA invasive ductal carcinoma, grade 1 estrogen receptor 100% positive, progesterone receptor 85% positive, with no HER-2 amplification and an MIB-1 of 12%  (1) OncotypeDX 02/28/2014 score of 8 predicts an outside-the-breast recurrence of 6% if the patient's only systemic treatment is tamoxifen for 5 years. It also predicts no benefit from chemotherapy  (2) completed adjuvant radiation 04/27/2014: 4250 cGy to the right breast with a 1000 cGy boost to the scar  (3) started tamoxifen January 2015    PLAN: Larkyn is tolerating tamoxifen with no side effects that she is aware of. The plan is to continue that at least for 2 years and then decide whether or not she would like to switch over to an aromatase inhibitor, in which case she could complete her antiestrogen therapy in 5 years. Note that she has a normal bone density at baseline.  Recall that her Oncotype predicts a very low risk of recurrence outside the breast even if we stay on tamoxifen and stop at 5 years.  We discussed her lipid profile. She has a good HDL level, but  the LDL is over 150. Even with an optimal diet she could not get this to 100. Note that she has a strong family history of coronary artery disease on her father's side. After much discussion we decided to give Crestor a try. She will start at 5 mg daily. We will check a lipid panel with her next visit. If she has managed to get the LDL below 100 we will try crest or every other day at that point. Of course if she has any significant symptoms she will let me know.  She will see Dr. Donne Hazel in 3 months and she will return to see me in 6. She knows to call for any problems that may develop before that visit.  Chauncey Cruel, MD   08/17/2014 4:24 PM

## 2014-08-18 ENCOUNTER — Telehealth: Payer: Self-pay | Admitting: Oncology

## 2014-08-18 NOTE — Telephone Encounter (Signed)
Spoke with patient and she is aware of her 01/2015 appointments

## 2014-10-09 ENCOUNTER — Other Ambulatory Visit: Payer: Self-pay | Admitting: Obstetrics and Gynecology

## 2014-10-10 LAB — CYTOLOGY - PAP

## 2014-11-14 ENCOUNTER — Other Ambulatory Visit: Payer: Self-pay | Admitting: Interventional Cardiology

## 2014-11-30 ENCOUNTER — Other Ambulatory Visit: Payer: Self-pay | Admitting: *Deleted

## 2014-11-30 ENCOUNTER — Other Ambulatory Visit: Payer: Self-pay | Admitting: Oncology

## 2014-11-30 DIAGNOSIS — E7800 Pure hypercholesterolemia, unspecified: Secondary | ICD-10-CM

## 2014-11-30 DIAGNOSIS — C50211 Malignant neoplasm of upper-inner quadrant of right female breast: Secondary | ICD-10-CM

## 2014-12-21 ENCOUNTER — Telehealth: Payer: Self-pay | Admitting: Interventional Cardiology

## 2014-12-21 NOTE — Telephone Encounter (Signed)
°  1. Which medications need to be refilled? Rhymal  2. Which pharmacy is medication to be sent to? [facility]  3. Do they need a 30 day or 90 day supply? 90 day  4. Would they like a call back once the medication has been sent to the pharmacy? Yes

## 2014-12-22 ENCOUNTER — Other Ambulatory Visit: Payer: Self-pay

## 2014-12-22 MED ORDER — PROPAFENONE HCL ER 225 MG PO CP12: 225.0000 mg | ORAL_CAPSULE | Freq: Two times a day (BID) | ORAL | Status: DC

## 2014-12-25 ENCOUNTER — Other Ambulatory Visit: Payer: Self-pay | Admitting: Interventional Cardiology

## 2015-01-06 ENCOUNTER — Emergency Department (HOSPITAL_COMMUNITY)
Admission: EM | Admit: 2015-01-06 | Discharge: 2015-01-06 | Disposition: A | Discharge status: Home / Self Care | Payer: 59 | Attending: Emergency Medicine | Admitting: Emergency Medicine

## 2015-01-06 ENCOUNTER — Encounter (HOSPITAL_COMMUNITY): Payer: Self-pay | Admitting: *Deleted

## 2015-01-06 ENCOUNTER — Emergency Department (HOSPITAL_COMMUNITY): Payer: 59

## 2015-01-06 DIAGNOSIS — G4700 Insomnia, unspecified: Secondary | ICD-10-CM | POA: Diagnosis not present

## 2015-01-06 DIAGNOSIS — Z23 Encounter for immunization: Secondary | ICD-10-CM | POA: Diagnosis not present

## 2015-01-06 DIAGNOSIS — Y998 Other external cause status: Secondary | ICD-10-CM | POA: Insufficient documentation

## 2015-01-06 DIAGNOSIS — S50851A Superficial foreign body of right forearm, initial encounter: Secondary | ICD-10-CM

## 2015-01-06 DIAGNOSIS — Y9289 Other specified places as the place of occurrence of the external cause: Secondary | ICD-10-CM | POA: Diagnosis not present

## 2015-01-06 DIAGNOSIS — G4730 Sleep apnea, unspecified: Secondary | ICD-10-CM | POA: Insufficient documentation

## 2015-01-06 DIAGNOSIS — S51821A Laceration with foreign body of right forearm, initial encounter: Secondary | ICD-10-CM | POA: Diagnosis not present

## 2015-01-06 DIAGNOSIS — W010XXA Fall on same level from slipping, tripping and stumbling without subsequent striking against object, initial encounter: Secondary | ICD-10-CM | POA: Insufficient documentation

## 2015-01-06 DIAGNOSIS — Y9301 Activity, walking, marching and hiking: Secondary | ICD-10-CM | POA: Diagnosis not present

## 2015-01-06 DIAGNOSIS — Z8719 Personal history of other diseases of the digestive system: Secondary | ICD-10-CM | POA: Diagnosis not present

## 2015-01-06 DIAGNOSIS — Z8679 Personal history of other diseases of the circulatory system: Secondary | ICD-10-CM | POA: Insufficient documentation

## 2015-01-06 DIAGNOSIS — F419 Anxiety disorder, unspecified: Secondary | ICD-10-CM | POA: Insufficient documentation

## 2015-01-06 DIAGNOSIS — Z853 Personal history of malignant neoplasm of breast: Secondary | ICD-10-CM | POA: Insufficient documentation

## 2015-01-06 DIAGNOSIS — S51811A Laceration without foreign body of right forearm, initial encounter: Secondary | ICD-10-CM

## 2015-01-06 DIAGNOSIS — S59911A Unspecified injury of right forearm, initial encounter: Secondary | ICD-10-CM | POA: Diagnosis present

## 2015-01-06 DIAGNOSIS — Z79899 Other long term (current) drug therapy: Secondary | ICD-10-CM | POA: Diagnosis not present

## 2015-01-06 DIAGNOSIS — Z7982 Long term (current) use of aspirin: Secondary | ICD-10-CM | POA: Insufficient documentation

## 2015-01-06 DIAGNOSIS — J45909 Unspecified asthma, uncomplicated: Secondary | ICD-10-CM | POA: Insufficient documentation

## 2015-01-06 MED ORDER — TETANUS-DIPHTH-ACELL PERTUSSIS 5-2.5-18.5 LF-MCG/0.5 IM SUSP
0.5000 mL | Freq: Once | INTRAMUSCULAR | Status: AC
  Administered 2015-01-06: 0.5 mL via INTRAMUSCULAR
  Filled 2015-01-06: qty 0.5

## 2015-01-06 MED ORDER — HYDROCODONE-ACETAMINOPHEN 5-325 MG PO TABS: 1.0000 | ORAL_TABLET | Freq: Four times a day (QID) | ORAL | Status: DC | PRN

## 2015-01-06 MED ORDER — LIDOCAINE-EPINEPHRINE (PF) 2 %-1:200000 IJ SOLN
10.0000 mL | Freq: Once | INTRAMUSCULAR | Status: AC
  Administered 2015-01-06: 10 mL
  Filled 2015-01-06: qty 20

## 2015-01-06 NOTE — ED Provider Notes (Addendum)
CSN: [id number]     Arrival date & time 01/06/15  6160 History   First MD Initiated Contact with Patient 01/06/15 0818     Chief Complaint  Patient presents with  . Arm Injury     (Consider location/radiation/quality/duration/timing/severity/associated sxs/prior Treatment) Patient is a 59 y.o. female presenting with arm injury. The history is provided by the patient.  Arm Injury Associated symptoms: no back pain, no fever and no neck pain   Patient c/o injury to right forearm early this AM. Got up last night, was walking in daughters room, and tripped, w fall, a piece of metal trophy became lodged in proximal right forearm. Mild pain localized to area. No numbness/weakness of arm or hand. Right hand dominant. Pt denies any other pain or injury associated w the fall. Specifically, no head injury or headache. No neck or back pain. Tetanus unknown.        Past Medical History  Diagnosis Date  . Insomnia     takes Ambien nightly as needed  . Atrial tachycardia     takes rythmol daily and cardiazem daily  . Asthma     as a child  . Sleep apnea     uses a CPAP  . Joint pain   . Joint swelling   . Back pain     occasionally  . Colitis     hx-one  . Chronic anxiety     takes Xanax daily as needed when heart rate going up  . Dysrhythmia     atrial tachycardia- takes meds and controls it  . Breast cancer 02/22/14    right upper inner  . S/P radiation therapy 03/28/2014 through 04/27/2014                                03/28/2014 through 04/27/2014                                                                          Right breast 4250 cGy 17 sessions, right breast boost 1000 cGy in 4 sessions (hypofractionated)                          Past Surgical History  Procedure Laterality Date  . Tubal ligation    . Colonoscopy    . Knee arthroscopy with medial menisectomy Left 03/31/2013    Procedure: LEFT KNEE ARTHROSCOPY WITH PARTIAL MEDIAL MENISECTOMY, CHONDROPLASTY OF PATELLA   FEMORAL JOINT, EXCISION OF MEDIAL PLICA, PARTIAL LATERAL MENISECTOMY;  Surgeon: [name], MD;  Location: [facility];  Service: Orthopedics;  Laterality: Left;  . Breast biopsy Left 05/02/1999    benign  . Cystoscopy    . Total knee arthroplasty Left 06/29/2013    Procedure: TOTAL KNEE ARTHROPLASTY;  Surgeon: [name], MD;  Location: [facility];  Service: Orthopedics;  Laterality: Left;  . Breast lumpectomy with axillary lymph node biopsy Right 02/22/14    upper inner   Family History  Problem Relation Age of Onset  . Heart disease Father   . Hypertension Father    Social History  Substance Use Topics  . Smoking status: Never  Smoker   . Smokeless tobacco: Never Used  . Alcohol Use: Yes     Comment: socailly   OB History    No data available     Review of Systems  Constitutional: Negative for fever.  Musculoskeletal: Negative for back pain and neck pain.  Skin: Positive for wound.  Neurological: Negative for weakness, numbness and headaches.      Allergies  Escitalopram oxalate  Home Medications   Prior to Admission medications   Medication Sig Start Date End Date Taking? Authorizing Provider  ALPRAZolam Duanne Moron) 0.25 MG tablet  03/08/14   Historical Provider, MD  aspirin 81 MG tablet Take 81 mg by mouth daily.    Historical Provider, MD  CRESTOR 5 MG tablet TAKE 1 TABLET BY MOUTH DAILY. 11/30/14   Chauncey Cruel, MD  diltiazem (CARDIZEM CD) 120 MG 24 hr capsule TAKE 1 CAPSULE BY MOUTH ONCE DAILY 12/25/14   Jettie Booze, MD  propafenone (RYTHMOL SR) 225 MG 12 hr capsule Take 1 capsule (225 mg total) by mouth 2 (two) times daily. 12/22/14   Jettie Booze, MD  tamoxifen (NOLVADEX) 20 MG tablet Take 1 tablet (20 mg total) by mouth daily. 06/30/14   Chauncey Cruel, MD  VITAMIN D, CHOLECALCIFEROL, PO Take 1 tablet by mouth daily.     Historical Provider, MD  zolpidem (AMBIEN) 10 MG tablet Take 5-10 mg by mouth at bedtime as needed. For sleep      Historical Provider, MD   BP 130/58 mmHg  Pulse 81  Temp(Src) 98.7 F (37.1 C) (Oral)  Resp 18  Ht 5\' 7"  (1.702 m)  Wt 185 lb (83.915 kg)  BMI 28.97 kg/m2  SpO2 98% Physical Exam  Constitutional: She appears well-developed and well-nourished. No distress.  HENT:  Head: Atraumatic.  Eyes: Conjunctivae are normal. No scleral icterus.  Neck: Neck supple. No tracheal deviation present.  Cardiovascular: Normal rate and intact distal pulses.   Pulmonary/Chest: Effort normal. No respiratory distress.  Abdominal: Normal appearance.  Musculoskeletal:  Small fb protruding from right proximal forearm. small adjacent 7-8 mm lac. Minimal swelling to area. Radial pulse 2+. c spine nt.  After fb removed, 2 small lacs in total, each 7-8 mm in length, no remaining fb seen or felt. No active bleeding.   Neurological: She is alert.  Alert, speech clear/fluent. Rue motor 5/5. Right m/r/u nerve fxn, motor and sens, intact. Good rom at elbow and wrist. No focal bony tenderness noted.   Skin: Skin is warm and dry. No rash noted. She is not diaphoretic.  Psychiatric: She has a normal mood and affect.  Nursing note and vitals reviewed.   ED Course  Procedures (including critical care time)  Imaging Review Dg Elbow Complete Right  01/06/2015   CLINICAL DATA:  The patient fell last night and has a foreign body in the soft tissues of the elbow.  EXAM: RIGHT ELBOW - COMPLETE 3+ VIEW  COMPARISON:  None.  FINDINGS: There is a metallic foreign body lodged in the soft tissues of the medial aspect of the proximal forearm. No osseous abnormality. No joint effusions.  IMPRESSION: Foreign body in the soft tissues of the proximal forearm.   Electronically Signed   By: Lorriane Shire M.D.   On: 01/06/2015 08:37   I, Culley Hedeen E, personally reviewed and evaluated these images and lab results as part of my medical decision-making.   MDM   Xray from triage.  Reviewed nursing notes and prior charts for  additional history.   LACERATION REPAIR Performed by: Mirna Mires Consent: Verbal consent obtained. Risks and benefits: risks, benefits and alternatives were discussed Patient identity confirmed: provided demographic data Time out performed prior to procedure Prepped and Draped in normal sterile fashion Wound explored, single fb removed, fb intact, no fragments Laceration Location: right forearm Laceration Length: 1.5cm No Foreign Bodies seen or palpated Anesthesia: local infiltration Local anesthetic: lidocaine 2% w epinephrine Anesthetic total: 3 ml Irrigation method: syringe Amount of cleaning: irrigated thoroughly w sterile saline Skin closure: 4-0 prolene Number of sutures or staples: 2 Technique: simple interrupted Patient tolerance: Patient tolerated the procedure well with no immediate complications.  Sterile dressing placed.  Tetanus im given.   Pt requests pain rx for home.  Post fb removal, no increase in bleeding or pain, no numbness/weakness.      Lajean Saver, MD 01/06/15 [phone]

## 2015-01-06 NOTE — Discharge Instructions (Signed)
It was our pleasure to provide your ER care today - we hope that you feel better.  Take motrin or aleve as need for pain. You may also take hydrocodone as need for pain. No driving when taking hydrocodone. Also, do not take tylenol or acetaminophen containing medication when taking hydrocodone.  Have sutures removed, your doctor or urgent care, in 7-9 days.  Return to ER right away if worse, infection of wound (spreading redness, pus, or fever), severe pain, other concern.      Laceration Care, Adult A laceration is a cut or lesion that goes through all layers of the skin and into the tissue just beneath the skin. TREATMENT  Some lacerations may not require closure. Some lacerations may not be able to be closed due to an increased risk of infection. It is important to see your caregiver as soon as possible after an injury to minimize the risk of infection and maximize the opportunity for successful closure. If closure is appropriate, pain medicines may be given, if needed. The wound will be cleaned to help prevent infection. Your caregiver will use stitches (sutures), staples, wound glue (adhesive), or skin adhesive strips to repair the laceration. These tools bring the skin edges together to allow for faster healing and a better cosmetic outcome. However, all wounds will heal with a scar. Once the wound has healed, scarring can be minimized by covering the wound with sunscreen during the day for 1 full year. HOME CARE INSTRUCTIONS  For sutures or staples:  Keep the wound clean and dry.  If you were given a bandage (dressing), you should change it at least once a day. Also, change the dressing if it becomes wet or dirty, or as directed by your caregiver.  Wash the wound with soap and water 2 times a day. Rinse the wound off with water to remove all soap. Pat the wound dry with a clean towel.  After cleaning, apply a thin layer of the antibiotic ointment as recommended by your caregiver.  This will help prevent infection and keep the dressing from sticking.  You may shower as usual after the first 24 hours. Do not soak the wound in water until the sutures are removed.  Only take over-the-counter or prescription medicines for pain, discomfort, or fever as directed by your caregiver.  Get your sutures or staples removed as directed by your caregiver. For skin adhesive strips:  Keep the wound clean and dry.  Do not get the skin adhesive strips wet. You may bathe carefully, using caution to keep the wound dry.  If the wound gets wet, pat it dry with a clean towel.  Skin adhesive strips will fall off on their own. You may trim the strips as the wound heals. Do not remove skin adhesive strips that are still stuck to the wound. They will fall off in time. For wound adhesive:  You may briefly wet your wound in the shower or bath. Do not soak or scrub the wound. Do not swim. Avoid periods of heavy perspiration until the skin adhesive has fallen off on its own. After showering or bathing, gently pat the wound dry with a clean towel.  Do not apply liquid medicine, cream medicine, or ointment medicine to your wound while the skin adhesive is in place. This may loosen the film before your wound is healed.  If a dressing is placed over the wound, be careful not to apply tape directly over the skin adhesive. This may cause the adhesive  to be pulled off before the wound is healed.  Avoid prolonged exposure to sunlight or tanning lamps while the skin adhesive is in place. Exposure to ultraviolet light in the first year will darken the scar.  The skin adhesive will usually remain in place for 5 to 10 days, then naturally fall off the skin. Do not pick at the adhesive film. You may need a tetanus shot if:  You cannot remember when you had your last tetanus shot.  You have never had a tetanus shot. If you get a tetanus shot, your arm may swell, get red, and feel warm to the touch. This is  common and not a problem. If you need a tetanus shot and you choose not to have one, there is a rare chance of getting tetanus. Sickness from tetanus can be serious. SEEK MEDICAL CARE IF:   You have redness, swelling, or increasing pain in the wound.  You see a red line that goes away from the wound.  You have yellowish-white fluid (pus) coming from the wound.  You have a fever.  You notice a bad smell coming from the wound or dressing.  Your wound breaks open before or after sutures have been removed.  You notice something coming out of the wound such as wood or glass.  Your wound is on your hand or foot and you cannot move a finger or toe. SEEK IMMEDIATE MEDICAL CARE IF:   Your pain is not controlled with prescribed medicine.  You have severe swelling around the wound causing pain and numbness or a change in color in your arm, hand, leg, or foot.  Your wound splits open and starts bleeding.  You have worsening numbness, weakness, or loss of function of any joint around or beyond the wound.  You develop painful lumps near the wound or on the skin anywhere on your body. MAKE SURE YOU:   Understand these instructions.  Will watch your condition.  Will get help right away if you are not doing well or get worse. Document Released: 05/12/2005 Document Revised: 08/04/2011 Document Reviewed: 11/05/2010 [facility] Patient Information 2015 [place], [place]. This information is not intended to replace advice given to you by your health care provider. Make sure you discuss any questions you have with your health care provider.

## 2015-01-06 NOTE — ED Notes (Signed)
Pt reports falling in the middle of the night, has piece of metal trophy lodged in right posterior arm, near her elbow. Minimal bleeding at this time.

## 2015-01-20 ENCOUNTER — Other Ambulatory Visit: Payer: Self-pay | Admitting: Physician Assistant

## 2015-01-20 ENCOUNTER — Telehealth: Payer: Self-pay | Admitting: Physician Assistant

## 2015-01-20 MED ORDER — PROPAFENONE HCL ER 225 MG PO CP12: 225.0000 mg | ORAL_CAPSULE | Freq: Two times a day (BID) | ORAL | Status: DC

## 2015-01-20 MED ORDER — PROPAFENONE HCL 225 MG PO TABS: 225.0000 mg | ORAL_TABLET | Freq: Three times a day (TID) | ORAL | Status: DC

## 2015-01-20 NOTE — Telephone Encounter (Signed)
Right a does not have extended release patch unknown. We'll resend prescription for immediate release to tide her over until she can pick up regular prescription from [facility].  Tarri Fuller PAC

## 2015-01-20 NOTE — Progress Notes (Signed)
PAtient out of [facility] and [facility] pharmacy closed.  I sent in 4 day script to [facility], [place], [place]

## 2015-01-22 ENCOUNTER — Other Ambulatory Visit: Payer: Self-pay | Admitting: Oncology

## 2015-01-23 ENCOUNTER — Telehealth: Payer: Self-pay | Admitting: Oncology

## 2015-01-23 NOTE — Telephone Encounter (Signed)
Confirmed appointment moved to November 16 & 23

## 2015-01-25 ENCOUNTER — Other Ambulatory Visit: Payer: Self-pay

## 2015-01-25 ENCOUNTER — Telehealth: Payer: Self-pay

## 2015-01-25 MED ORDER — PROPAFENONE HCL ER 225 MG PO CP12: 225.0000 mg | ORAL_CAPSULE | Freq: Two times a day (BID) | ORAL | Status: DC

## 2015-01-25 NOTE — Telephone Encounter (Signed)
Jettie Booze, MD at 11/21/2013 3:32 PM   propafenone (RYTHMOL SR) 225 MG 12 hr capsule TAKE 1 CAPSULE BY MOUTH EVERY 12 HOURS         ASSESSMENT AND PLAN:  Paroxysmal supraventricular tachycardia  Continue Rythmol SR Capsule Extended Release 12 Hour, 225 MG, 1 capsule, Orally, every 12 hrs  Brett Canales, PA-C at 01/20/2015 1:28 PM     Status: Signed       Expand All Collapse All   Right a does not have extended release patch unknown. We'll resend prescription for immediate release to tide her over until she can pick up regular prescription from [facility].  HAGER, BRYAN PAC             Approved      Disp Refills Start End    propafenone (RYTHMOL) 225 MG tablet 12 tablet 0 01/20/2015     Sig - Route:  Take 1 tablet (225 mg total) by mouth every 8 (eight) hours. - Oral    Class:  Normal    Authorizing Provider:  Brett Canales, PA-C

## 2015-01-25 NOTE — Telephone Encounter (Signed)
Yes.  Please send.  Pamela Underwood PAC

## 2015-02-01 ENCOUNTER — Encounter: Payer: Self-pay | Admitting: Interventional Cardiology

## 2015-02-01 ENCOUNTER — Ambulatory Visit (INDEPENDENT_AMBULATORY_CARE_PROVIDER_SITE_OTHER): Payer: 59 | Admitting: Interventional Cardiology

## 2015-02-01 VITALS — BP 130/82 | HR 80 | Ht 67.0 in | Wt 192.8 lb

## 2015-02-01 DIAGNOSIS — I471 Supraventricular tachycardia, unspecified: Secondary | ICD-10-CM

## 2015-02-01 DIAGNOSIS — G4733 Obstructive sleep apnea (adult) (pediatric): Secondary | ICD-10-CM | POA: Diagnosis not present

## 2015-02-01 MED ORDER — PROPAFENONE HCL ER 225 MG PO CP12: 225.0000 mg | ORAL_CAPSULE | Freq: Two times a day (BID) | ORAL | Status: DC

## 2015-02-01 NOTE — Patient Instructions (Signed)
Medication Instructions:   Your physician recommends that you continue on your current medications as directed. Please refer to the Current Medication list given to you today.    Labwork:  NONE ORDER TODAY    Testing/Procedures: NONE ORDER TODAY    Follow-Up:  Your physician wants you to follow-up in: [place] will receive a reminder letter in the mail two months in advance. If you don't receive a letter, please call our office to schedule the follow-up appointment.    Any Other Special Instructions Will Be Listed Below (If Applicable).

## 2015-02-01 NOTE — Progress Notes (Signed)
Patient ID: Pamela Underwood, female   DOB: Aug 23, 1955, 59 y.o.   MRN: [id number]     Cardiology Office Note   Date:  02/01/2015   ID:  Pamela Underwood, DOB 1955-09-23, MRN [id number]  PCP:  Allena Katz, MD    No chief complaint on file. f/u SVT   Wt Readings from Last 3 Encounters:  02/01/15 192 lb 12.8 oz (87.454 kg)  01/06/15 185 lb (83.915 kg)  08/17/14 191 lb 6.4 oz (86.818 kg)       History of Present Illness: Pamela Underwood is a 59 y.o. female  who has had SVT. Controlled on propafenone and diltiazem. Has declined evaluation for ablation in the past. She had breast cancer treated with surgery and radiation.  She had no SVT with that process.  She is back to exercising but then had some breast pain.  She has cut back on the exercise and this resolved with less exercise.  SHe has gained weight.  SHe has a MMG scheduled.     overall, she feels well from a cardiac standpoint. She needs a refill on her propafenone.       Past Medical History  Diagnosis Date  . Insomnia     takes Ambien nightly as needed  . Atrial tachycardia     takes rythmol daily and cardiazem daily  . Asthma     as a child  . Sleep apnea     uses a CPAP  . Joint pain   . Joint swelling   . Back pain     occasionally  . Colitis     hx-one  . Chronic anxiety     takes Xanax daily as needed when heart rate going up  . Dysrhythmia     atrial tachycardia- takes meds and controls it  . Breast cancer 02/22/14    right upper inner  . S/P radiation therapy 03/28/2014 through 04/27/2014                                03/28/2014 through 04/27/2014                                                                          Right breast 4250 cGy 17 sessions, right breast boost 1000 cGy in 4 sessions (hypofractionated)                           Past Surgical History  Procedure Laterality Date  . Tubal ligation    . Colonoscopy    . Knee arthroscopy with medial menisectomy Left 03/31/2013   Procedure: LEFT KNEE ARTHROSCOPY WITH PARTIAL MEDIAL MENISECTOMY, CHONDROPLASTY OF PATELLA  FEMORAL JOINT, EXCISION OF MEDIAL PLICA, PARTIAL LATERAL MENISECTOMY;  Surgeon: [name], MD;  Location: [facility];  Service: Orthopedics;  Laterality: Left;  . Breast biopsy Left 05/02/1999    benign  . Cystoscopy    . Total knee arthroplasty Left 06/29/2013    Procedure: TOTAL KNEE ARTHROPLASTY;  Surgeon: [name], MD;  Location: [facility];  Service: Orthopedics;  Laterality: Left;  . Breast lumpectomy with axillary lymph  node biopsy Right 02/22/14    upper inner     Current Outpatient Prescriptions  Medication Sig Dispense Refill  . aspirin 81 MG tablet Take 81 mg by mouth daily.    . CRESTOR 5 MG tablet TAKE 1 TABLET BY MOUTH DAILY. 30 tablet 2  . diltiazem (CARDIZEM CD) 120 MG 24 hr capsule TAKE 1 CAPSULE BY MOUTH ONCE DAILY 30 capsule 3  . propafenone (RYTHMOL SR) 225 MG 12 hr capsule Take 1 capsule (225 mg total) by mouth every 12 (twelve) hours. 15 capsule 0  . tamoxifen (NOLVADEX) 20 MG tablet Take 1 tablet (20 mg total) by mouth daily. 90 tablet 3  . VITAMIN D, CHOLECALCIFEROL, PO Take 1 tablet by mouth daily.     Marland Kitchen zolpidem (AMBIEN) 10 MG tablet Take 5-10 mg by mouth at bedtime as needed for sleep.      No current facility-administered medications for this visit.    Allergies:   Escitalopram oxalate    Social History:  The patient  reports that she has never smoked. She has never used smokeless tobacco. She reports that she drinks alcohol. She reports that she does not use illicit drugs.   Family History:  The patient's family history includes Heart disease in her father; Hypertension in her father.    ROS:  Please see the history of present illness.   Otherwise, review of systems are positive for  Breast pain post surgery.   All other systems are reviewed and negative.    PHYSICAL EXAM: VS:  BP 130/82 mmHg  Pulse 80  Ht 5\' 7"  (1.702 m)  Wt 192 lb 12.8 oz  (87.454 kg)  BMI 30.19 kg/m2 , BMI Body mass index is 30.19 kg/(m^2). GEN: Well nourished, well developed, in no acute distress HEENT: normal Neck: no JVD, carotid bruits, or masses Cardiac: RRR; no murmurs, rubs, or gallops,no edema  Respiratory:  clear to auscultation bilaterally, normal work of breathing GI: soft, nontender, nondistended, + BS MS: no deformity or atrophy Skin: warm and dry, no rash Neuro:  Strength and sensation are intact Psych: euthymic mood, full affect   EKG:   The ekg ordered today demonstrates  Normal sinus rhythm , nonspecific ST segment changes , QTC 459 ms, unchanged from 2015 ECG   Recent Labs: 08/17/2014: ALT 16; BUN 23.0; Creatinine 0.8; HGB 13.2; Platelets 199; Potassium 3.8; Sodium 141   Lipid Panel    Component Value Date/Time   CHOL 254* 06/12/2014 0837   TRIG 84 06/12/2014 0837   HDL 84 06/12/2014 0837   CHOLHDL 3.0 06/12/2014 0837   VLDL 17 06/12/2014 0837   LDLCALC 153* 06/12/2014 0837     Other studies Reviewed: Additional studies/ records that were reviewed today with results demonstrating: .   ASSESSMENT AND PLAN:  1. SVT: Maintaining NSR.  COntinue rhythmol and diltiazem. Refill both meds.   She will let us know if she has any further symptoms.   No issues during her breast cancer treatment. 2. OSA:  Continue using C Pap. Hopefully, she will be able to resume exercise and this will help with weight loss.   Current medicines are reviewed at length with the patient today.  The patient concerns regarding her medicines were addressed.  The following changes have been made:  No change  Labs/ tests ordered today include:  No orders of the defined types were placed in this encounter.    Recommend 150 minutes/week of aerobic exercise Low fat, low carb, high fiber diet  recommended  Disposition:   FU in 1 year   Teresita Madura., MD  02/01/2015 4:15 PM    [facility] [place],  [place], [place]  [address] Phone: [phone]; Fax: [phone]

## 2015-02-08 ENCOUNTER — Other Ambulatory Visit: Payer: 59

## 2015-02-15 ENCOUNTER — Ambulatory Visit: Payer: 59 | Admitting: Oncology

## 2015-03-19 ENCOUNTER — Telehealth: Payer: Self-pay | Admitting: Interventional Cardiology

## 2015-03-19 NOTE — Telephone Encounter (Signed)
New Message     Pt calling stating that she needs a prescription for Cpap equipment supplies faxed to [facility] Fax: [phone]. Please call back and advise.

## 2015-03-20 NOTE — Telephone Encounter (Signed)
Order signed and faxed back

## 2015-03-26 NOTE — Telephone Encounter (Signed)
Attempted to call patient to schedule appointment.  Answered the phone stating that we will have to call her back.

## 2015-03-27 ENCOUNTER — Other Ambulatory Visit: Payer: Self-pay | Admitting: *Deleted

## 2015-03-27 DIAGNOSIS — G4733 Obstructive sleep apnea (adult) (pediatric): Secondary | ICD-10-CM

## 2015-03-27 NOTE — Telephone Encounter (Signed)
Left message to call to schedule appointment with Dr. Radford Pax.   If patient calls on 03/28/15, please schedule with just first available 15 minute appointment with Dr. Radford Pax

## 2015-04-10 ENCOUNTER — Other Ambulatory Visit: Payer: Self-pay

## 2015-04-10 DIAGNOSIS — C50211 Malignant neoplasm of upper-inner quadrant of right female breast: Secondary | ICD-10-CM

## 2015-04-11 ENCOUNTER — Other Ambulatory Visit: Payer: 59

## 2015-04-18 ENCOUNTER — Ambulatory Visit: Payer: 59 | Admitting: Oncology

## 2015-05-04 ENCOUNTER — Other Ambulatory Visit: Payer: Self-pay | Admitting: Interventional Cardiology

## 2015-05-31 ENCOUNTER — Other Ambulatory Visit: Payer: Self-pay | Admitting: Oncology

## 2015-05-31 ENCOUNTER — Other Ambulatory Visit: Payer: Self-pay | Admitting: Interventional Cardiology

## 2015-05-31 MED FILL — PROPAFENONE HCL ER 225 MG C: 225 | 30 days supply | Qty: 60 | Fill #0

## 2015-05-31 MED FILL — DILTIAZEM 24HR ER 120 MG CA: 120 | 30 days supply | Qty: 30 | Fill #1

## 2015-05-31 MED FILL — ROSUVASTATIN CALCIUM 5 MG T: 5 | 30 days supply | Qty: 30 | Fill #0

## 2015-06-04 ENCOUNTER — Ambulatory Visit: Payer: 59 | Admitting: Cardiology

## 2015-06-05 ENCOUNTER — Encounter: Payer: Self-pay | Admitting: Cardiology

## 2015-06-05 ENCOUNTER — Other Ambulatory Visit: Payer: Self-pay | Admitting: *Deleted

## 2015-06-06 ENCOUNTER — Encounter: Payer: Self-pay | Admitting: Cardiology

## 2015-06-06 ENCOUNTER — Ambulatory Visit (INDEPENDENT_AMBULATORY_CARE_PROVIDER_SITE_OTHER): Payer: 59 | Admitting: Cardiology

## 2015-06-06 ENCOUNTER — Other Ambulatory Visit (HOSPITAL_BASED_OUTPATIENT_CLINIC_OR_DEPARTMENT_OTHER): Payer: 59

## 2015-06-06 VITALS — BP 142/90 | HR 100 | Ht 67.0 in | Wt 191.0 lb

## 2015-06-06 DIAGNOSIS — R030 Elevated blood-pressure reading, without diagnosis of hypertension: Secondary | ICD-10-CM | POA: Diagnosis not present

## 2015-06-06 DIAGNOSIS — E669 Obesity, unspecified: Secondary | ICD-10-CM | POA: Diagnosis not present

## 2015-06-06 DIAGNOSIS — G4733 Obstructive sleep apnea (adult) (pediatric): Secondary | ICD-10-CM

## 2015-06-06 DIAGNOSIS — C50211 Malignant neoplasm of upper-inner quadrant of right female breast: Secondary | ICD-10-CM | POA: Diagnosis not present

## 2015-06-06 DIAGNOSIS — IMO0001 Reserved for inherently not codable concepts without codable children: Secondary | ICD-10-CM

## 2015-06-06 LAB — CBC WITH DIFFERENTIAL/PLATELET
BASO%: 0.5 % (ref 0.0–2.0)
Basophils Absolute: 0 10*3/uL (ref 0.0–0.1)
EOS%: 1.2 % (ref 0.0–7.0)
Eosinophils Absolute: 0.1 10*3/uL (ref 0.0–0.5)
HCT: 41.7 % (ref 34.8–46.6)
HGB: 14.1 g/dL (ref 11.6–15.9)
LYMPH#: 2.9 10*3/uL (ref 0.9–3.3)
LYMPH%: 33.9 % (ref 14.0–49.7)
MCH: 32 pg (ref 25.1–34.0)
MCHC: 33.7 g/dL (ref 31.5–36.0)
MCV: 94.8 fL (ref 79.5–101.0)
MONO#: 0.8 10*3/uL (ref 0.1–0.9)
MONO%: 8.8 % (ref 0.0–14.0)
NEUT%: 55.6 % (ref 38.4–76.8)
NEUTROS ABS: 4.8 10*3/uL (ref 1.5–6.5)
PLATELETS: 168 10*3/uL (ref 145–400)
RBC: 4.39 10*6/uL (ref 3.70–5.45)
RDW: 12.4 % (ref 11.2–14.5)
WBC: 8.7 10*3/uL (ref 3.9–10.3)

## 2015-06-06 LAB — COMPREHENSIVE METABOLIC PANEL
ALBUMIN: 4 g/dL (ref 3.5–5.0)
ALK PHOS: 64 U/L (ref 40–150)
ALT: 21 U/L (ref 0–55)
AST: 20 U/L (ref 5–34)
Anion Gap: 11 mEq/L (ref 3–11)
BILIRUBIN TOTAL: 0.47 mg/dL (ref 0.20–1.20)
BUN: 12.7 mg/dL (ref 7.0–26.0)
CALCIUM: 8.9 mg/dL (ref 8.4–10.4)
CO2: 24 mEq/L (ref 22–29)
CREATININE: 0.9 mg/dL (ref 0.6–1.1)
Chloride: 105 mEq/L (ref 98–109)
EGFR: 75 mL/min/{1.73_m2} — ABNORMAL LOW (ref >90)
Glucose: 105 mg/dl (ref 70–140)
Potassium: 4.4 mEq/L (ref 3.5–5.1)
Sodium: 140 mEq/L (ref 136–145)
Total Protein: 6.9 g/dL (ref 6.4–8.3)

## 2015-06-06 NOTE — Patient Instructions (Signed)

## 2015-06-06 NOTE — Progress Notes (Signed)
Cardiology Office Note   Date:  06/06/2015   ID:  Pamela Underwood, DOB 1956-03-16, MRN [id number]  PCP:  Allena Katz, MD    Chief Complaint  Patient presents with  . Sleep Apnea  . Hypertension      History of Present Illness: Pamela Underwood is a 60y.o. female with a history of SVT and  moderate OSA now on CPAP. She uses a Resmed Airfit Nasal mask. She feels rested when she gets up in the am. She has no daytime sleepiness. She denies any chest pain, SOB, DOE, LE edema, dizziness, palpitations or syncope.      Past Medical History  Diagnosis Date  . Insomnia     takes Ambien nightly as needed  . Atrial tachycardia ([place])     takes rythmol daily and cardiazem daily  . Asthma     as a child  . Sleep apnea     uses a CPAP  . Joint pain   . Joint swelling   . Back pain     occasionally  . Colitis     hx-one  . Chronic anxiety     takes Xanax daily as needed when heart rate going up  . Dysrhythmia     atrial tachycardia- takes meds and controls it  . Breast cancer ([place]) 02/22/14    right upper inner  . S/P radiation therapy 03/28/2014 through 04/27/2014                                03/28/2014 through 04/27/2014                                                                          Right breast 4250 cGy 17 sessions, right breast boost 1000 cGy in 4 sessions (hypofractionated)                         . Elevated BP 06/06/2015    Past Surgical History  Procedure Laterality Date  . Tubal ligation    . Colonoscopy    . Knee arthroscopy with medial menisectomy Left 03/31/2013    Procedure: LEFT KNEE ARTHROSCOPY WITH PARTIAL MEDIAL MENISECTOMY, CHONDROPLASTY OF PATELLA  FEMORAL JOINT, EXCISION OF MEDIAL PLICA, PARTIAL LATERAL MENISECTOMY;  Surgeon: [name], MD;  Location: [facility];  Service: Orthopedics;  Laterality: Left;  . Breast biopsy Left 05/02/1999    benign  . Cystoscopy    . Total knee arthroplasty Left  06/29/2013    Procedure: TOTAL KNEE ARTHROPLASTY;  Surgeon: [name], MD;  Location: [facility];  Service: Orthopedics;  Laterality: Left;  . Breast lumpectomy with axillary lymph node biopsy Right 02/22/14    upper inner     Current Outpatient Prescriptions  Medication Sig Dispense Refill  . aspirin 81 MG tablet Take 81 mg by mouth daily.    Marland Kitchen diltiazem (CARDIZEM CD) 120 MG 24 hr capsule TAKE 1 CAPSULE BY MOUTH ONCE DAILY 30 capsule 8  . propafenone (RYTHMOL SR) 225 MG 12 hr capsule TAKE 1  CAPSULE BY MOUTH EVERY 12 HOURS 60 capsule 7  . rosuvastatin (CRESTOR) 5 MG tablet TAKE 1 TABLET BY MOUTH DAILY. 30 tablet 2  . tamoxifen (NOLVADEX) 20 MG tablet Take 1 tablet (20 mg total) by mouth daily. 90 tablet 3  . VITAMIN D, CHOLECALCIFEROL, PO Take 1 tablet by mouth daily.     Marland Kitchen zolpidem (AMBIEN) 10 MG tablet Take 5-10 mg by mouth at bedtime as needed for sleep.      No current facility-administered medications for this visit.    Allergies:   Escitalopram oxalate    Social History:  The patient  reports that she has never smoked. She has never used smokeless tobacco. She reports that she drinks alcohol. She reports that she does not use illicit drugs.   Family History:  The patient's family history includes Heart disease in her father; Hypertension in her father.    ROS:  Please see the history of present illness.   Otherwise, review of systems are positive for none.   All other systems are reviewed and negative.    PHYSICAL EXAM: VS:  BP 142/90 mmHg  Pulse 100  Ht 5\' 7"  (1.702 m)  Wt 191 lb (86.637 kg)  BMI 29.91 kg/m2 , BMI Body mass index is 29.91 kg/(m^2). GEN: Well nourished, well developed, in no acute distress HEENT: normal Neck: no JVD, carotid bruits, or masses Cardiac: RRR; no murmurs, rubs, or gallops,no edema  Respiratory:  clear to auscultation bilaterally, normal work of breathing GI: soft, nontender, nondistended, + BS MS: no deformity or atrophy Skin: warm and  dry, no rash Neuro:  Strength and sensation are intact Psych: euthymic mood, full affect   EKG:  EKG was ordered today and showed sinus tachycardia at 104bpm with no ST changes.  Poor R wave progression in anterior leads unchanged from EKG of 10/2013   Recent Labs: 08/17/2014: ALT 16; BUN 23.0; Creatinine 0.8; HGB 13.2; Platelets 199; Potassium 3.8; Sodium 141    Lipid Panel    Component Value Date/Time   CHOL 254* 06/12/2014 0837   TRIG 84 06/12/2014 0837   HDL 84 06/12/2014 0837   CHOLHDL 3.0 06/12/2014 0837   VLDL 17 06/12/2014 0837   LDLCALC 153* 06/12/2014 0837      Wt Readings from Last 3 Encounters:  06/06/15 191 lb (86.637 kg)  02/01/15 192 lb 12.8 oz (87.454 kg)  01/06/15 185 lb (83.915 kg)    ASSESSMENT AND PLAN:  1. OSA on CPAP and doing well.  Her d/l showed an AHI of 0.9/hr on 10cm H2O and 67% compliance in using more than 4 hours nightly.  I encouraged her to increase her usage.  She has not been using it as much due to family stress and not sleeping well due to worries with family issues. 2. Obesity - She stopped exercising at the end of the summer and has gained some weight.  I encouraged her to try to get into a routine program 3. Elevated BP - at home her BP runs around 130/80'smmHg. She has had a lot of family stress for the past few months.   I think she has white coat HTN.  She checks her BP at work and it is normal. 4. SVT with no reoccurrence. 5. Mild sinus tachycardia - she says that she gets very anxious coming to our office and her HR is normal at home and at work.     Current medicines are reviewed at length with the patient today.  The patient does not have concerns regarding medicines.  The following changes have been made:  no change  Labs/ tests ordered today: See above Assessment and Plan No orders of the defined types were placed in this encounter.     Disposition:   FU with me in 1 year  Signed, Sueanne Margarita, MD  06/06/2015 2:08  PM    [facility] [place], [place], [place]  [address] Phone: [phone]; Fax: [phone]

## 2015-06-13 ENCOUNTER — Ambulatory Visit (HOSPITAL_BASED_OUTPATIENT_CLINIC_OR_DEPARTMENT_OTHER): Payer: 59 | Admitting: Oncology

## 2015-06-13 VITALS — BP 144/69 | HR 78 | Temp 98.1°F | Resp 18 | Ht 67.0 in | Wt 196.8 lb

## 2015-06-13 DIAGNOSIS — E669 Obesity, unspecified: Secondary | ICD-10-CM | POA: Insufficient documentation

## 2015-06-13 DIAGNOSIS — I471 Supraventricular tachycardia: Secondary | ICD-10-CM

## 2015-06-13 DIAGNOSIS — C50211 Malignant neoplasm of upper-inner quadrant of right female breast: Secondary | ICD-10-CM

## 2015-06-13 DIAGNOSIS — C50411 Malignant neoplasm of upper-outer quadrant of right female breast: Secondary | ICD-10-CM

## 2015-06-13 DIAGNOSIS — Z79810 Long term (current) use of selective estrogen receptor modulators (SERMs): Secondary | ICD-10-CM | POA: Diagnosis not present

## 2015-06-13 DIAGNOSIS — Z170 Estrogen receptor positive status [ER+]: Secondary | ICD-10-CM

## 2015-06-13 MED ORDER — TAMOXIFEN CITRATE 20 MG PO TABS: 20.0000 mg | ORAL_TABLET | Freq: Every day | ORAL | Status: DC

## 2015-06-13 MED ORDER — ROSUVASTATIN CALCIUM 5 MG PO TABS: 5.0000 mg | ORAL_TABLET | Freq: Every day | ORAL | Status: DC

## 2015-06-13 NOTE — Progress Notes (Signed)
[facility]  [phone] [phone]     ID: Pamela Underwood DOB: July 05, 1955  MR#: [id number]  XSO#:[id number]  Patient Care Team: Harold Hedge, MD as PCP - General (Obstetrics and Gynecology) Corky Crafts, MD as Consulting Physician (Cardiology) Harold Hedge, MD as Consulting Physician (Obstetrics and Gynecology) Lowella Dell, MD as Consulting Physician (Oncology) OTHER MD: Emelia Loron M.D., Chipper Herb M.D.  CHIEF COMPLAINT: Estrogen receptor positive breast cancer  CURRENT TREATMENT: tamoxifen   BREAST CANCER HISTORY: From the original intake note:  Pamela Underwood had routine screening mammography at [facility] 02/03/2014. This showed a focal asymmetry in the right breast and additional views on 02/06/2014 showed an irregular mass in the right breast, 11:00 position. Ultrasound the same day showed this to be 1.1 cm, with irregular margins, and hypoechoic. There was also an oval lymph nodes with focal cortical thickening in the right axilla. There was also, finally, a 1.1 cm oval mass with circumscribed margins in the right breast upper inner quadrant. Color flow imaging there showed no increase in vascularity.biopsy of the upper outer quadrant right breast mass showed invasive ductal carcinoma. The upper inner quadrant right breast lesion was a fibroadenoma versus a benign phyllodes tumor. The right axillary lymph node that was suspicious was negative for metastatic disease (results are taken from MRI dictation, as the pathology report is not in EPIC).  On 02/08/2014 the patient underwent bilateral breast MRIs. This showed, in the upper outer right breast an irregular enhancing mass measuring 1.4 cm. There were mildly prominent right axillary lymph nodes and bilateral nonspecific internal mammary lymph nodes measuring up to 4 mm.  On 02/22/2014 the patient underwent right lumpectomy for the upper inner quadrant lesion which proved to be a  fibroadenoma. On the same day right upper outer quadrant lumpectomy showed an invasive ductal carcinoma measuring 1.6 cm, in the setting of ductal carcinoma in situ. The invasive tumor was less than a millimeter from the anterior margin. Both sentinel lymph nodes were benign.  The patient's subsequent history is as detailed below  INTERVAL HISTORY: Pamela Underwood returns today for follow-up of her estrogen receptor positivebreast cancer. She continues on tamoxifen, which she tolerates extremely well. She has occasional hot flashes. They do not occur at night. Vaginal wetness is not a major issue. She obtains a drug at no cost.  At the last visit we also started her on low-dose Crestor. She has not developed the arthralgias or myalgias that patients can experience him that medication.  REVIEW OF SYSTEMS: Pamela Underwood is not exercising as much as she would like althoughshe has kept her gym membership. She is a little bit "down" because her husband lost his job July 2016.she says that's why she is not going to the gym although she understands that would help her stress and also interrupt the steady weight gain that tends to occur with menopause. She continues to work 32 hours a week and enjoys what she does. Aside from these issues a detailed review of systems today was noncontributory  PAST MEDICAL HISTORY: Past Medical History  Diagnosis Date  . Insomnia     takes Ambien nightly as needed  . Atrial tachycardia (HCC)     takes rythmol daily and cardiazem daily  . Asthma     as a child  . Sleep apnea     uses a CPAP  . Joint pain   . Joint swelling   . Back pain     occasionally  . Colitis  hx-one  . Chronic anxiety     takes Xanax daily as needed when heart rate going up  . Dysrhythmia     atrial tachycardia- takes meds and controls it  . Breast cancer ([place]) 02/22/14    right upper inner  . S/P radiation therapy 03/28/2014 through 04/27/2014                                03/28/2014 through  04/27/2014                                                                          Right breast 4250 cGy 17 sessions, right breast boost 1000 cGy in 4 sessions (hypofractionated)                         . Elevated BP 06/06/2015    PAST SURGICAL HISTORY: Past Surgical History  Procedure Laterality Date  . Tubal ligation    . Colonoscopy    . Knee arthroscopy with medial menisectomy Left 03/31/2013    Procedure: LEFT KNEE ARTHROSCOPY WITH PARTIAL MEDIAL MENISECTOMY, CHONDROPLASTY OF PATELLA  FEMORAL JOINT, EXCISION OF MEDIAL PLICA, PARTIAL LATERAL MENISECTOMY;  Surgeon: [name], MD;  Location: [place];  Service: Orthopedics;  Laterality: Left;  . Breast biopsy Left 05/02/1999    benign  . Cystoscopy    . Total knee arthroplasty Left 06/29/2013    Procedure: TOTAL KNEE ARTHROPLASTY;  Surgeon: [name], MD;  Location: [place];  Service: Orthopedics;  Laterality: Left;  . Breast lumpectomy with axillary lymph node biopsy Right 02/22/14    upper inner    FAMILY HISTORY Family History  Problem Relation Age of Onset  . Heart disease Father   . Hypertension Father    the patient's father died from a myocardial infarction in his 13s. The patient's mother also died in her 18s, after her an accidental fire exposure. The patient had one sister, no brothers. There is no history of breast or ovarian cancer in the family  GYNECOLOGIC HISTORY:  No LMP recorded. Patient is postmenopausal. Menarche age 81, first live birth age 34, the patient is GX P3. She stopped having periods at the age of 41. She did not take hormone replacement.  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 Her husband Pamela Underwood is vice Radio producer for a [employer]. Daughter Pamela Underwood works as a Marine scientist in [place]. Daughter Pamela Underwood teaches kindergarten in [place]. Son Pamela Underwood is Facilities manager in [place]. The patient has no grandchildren. She attends a [employer]    ADVANCED DIRECTIVES: Not in place   HEALTH MAINTENANCE: Social History  Substance Use Topics  . Smoking status: Never Smoker   . Smokeless tobacco: Never Used  . Alcohol Use: Yes     Comment: socailly     Colonoscopy:  PAP:  Bone density:[facility] 05/15/2014 -- normal  Lipid panel:  Allergies  Allergen Reactions  . Escitalopram Oxalate Other (See Comments)    Hives, welts, all over skin rash    Current Outpatient Prescriptions  Medication Sig Dispense Refill  . aspirin 81  MG tablet Take 81 mg by mouth daily.    Marland Kitchen diltiazem (CARDIZEM CD) 120 MG 24 hr capsule TAKE 1 CAPSULE BY MOUTH ONCE DAILY 30 capsule 8  . propafenone (RYTHMOL SR) 225 MG 12 hr capsule TAKE 1 CAPSULE BY MOUTH EVERY 12 HOURS 60 capsule 7  . rosuvastatin (CRESTOR) 5 MG tablet TAKE 1 TABLET BY MOUTH DAILY. 30 tablet 2  . tamoxifen (NOLVADEX) 20 MG tablet Take 1 tablet (20 mg total) by mouth daily. 90 tablet 3  . VITAMIN D, CHOLECALCIFEROL, PO Take 1 tablet by mouth daily.     Marland Kitchen zolpidem (AMBIEN) 10 MG tablet Take 5-10 mg by mouth at bedtime as needed for sleep.      No current facility-administered medications for this visit.    OBJECTIVE: Middle-aged white woman who appears well  Filed Vitals:   06/13/15 1538  BP: 144/69  Pulse: 78  Temp: 98.1 F (36.7 C)  Resp: 18     Body mass index is 30.82 kg/(m^2).    ECOG FS:0 - Asymptomatic  Sclerae unicteric, EOMs intact Oropharynx clear, dentition in good repair No cervical or supraclavicular adenopathy Lungs no rales or rhonchi Heart regular rate and rhythm Abd soft, nontender, positive bowel sounds MSK no focal spinal tenderness, no upper extremity lymphedema Neuro: nonfocal, well oriented, appropriate affect Breasts: right breast is status post lumpectomy and radiation. There is no evidence of local recurrence. The cosmetic result is excellent. The right axilla is benign. The left breast is unremarkable.   LAB RESULTS:  CMP     Component  Value Date/Time   NA 140 06/06/2015 1556   NA 140 02/20/2014 0930   K 4.4 06/06/2015 1556   K 4.8 02/20/2014 0930   CL 101 02/20/2014 0930   CO2 24 06/06/2015 1556   CO2 28 02/20/2014 0930   GLUCOSE 105 06/06/2015 1556   GLUCOSE 104* 02/20/2014 0930   BUN 12.7 06/06/2015 1556   BUN 14 02/20/2014 0930   CREATININE 0.9 06/06/2015 1556   CREATININE 0.72 02/20/2014 0930   CALCIUM 8.9 06/06/2015 1556   CALCIUM 9.5 02/20/2014 0930   PROT 6.9 06/06/2015 1556   PROT 7.3 06/27/2013 1403   ALBUMIN 4.0 06/06/2015 1556   ALBUMIN 4.3 06/27/2013 1403   AST 20 06/06/2015 1556   AST 19 06/27/2013 1403   ALT 21 06/06/2015 1556   ALT 24 06/27/2013 1403   ALKPHOS 64 06/06/2015 1556   ALKPHOS 75 06/27/2013 1403   BILITOT 0.47 06/06/2015 1556   BILITOT 0.4 06/27/2013 1403   GFRNONAA >90 02/20/2014 0930   GFRAA >90 02/20/2014 0930    I No results found for: SPEP  Lab Results  Component Value Date   WBC 8.7 06/06/2015   NEUTROABS 4.8 06/06/2015   HGB 14.1 06/06/2015   HCT 41.7 06/06/2015   MCV 94.8 06/06/2015   PLT 168 06/06/2015      Chemistry      Component Value Date/Time   NA 140 06/06/2015 1556   NA 140 02/20/2014 0930   K 4.4 06/06/2015 1556   K 4.8 02/20/2014 0930   CL 101 02/20/2014 0930   CO2 24 06/06/2015 1556   CO2 28 02/20/2014 0930   BUN 12.7 06/06/2015 1556   BUN 14 02/20/2014 0930   CREATININE 0.9 06/06/2015 1556   CREATININE 0.72 02/20/2014 0930      Component Value Date/Time   CALCIUM 8.9 06/06/2015 1556   CALCIUM 9.5 02/20/2014 0930   ALKPHOS 64 06/06/2015 1556  ALKPHOS 75 06/27/2013 1403   AST 20 06/06/2015 1556   AST 19 06/27/2013 1403   ALT 21 06/06/2015 1556   ALT 24 06/27/2013 1403   BILITOT 0.47 06/06/2015 1556   BILITOT 0.4 06/27/2013 1403       No results found for: LABCA2  No components found for: LABCA125  No results for input(s): INR in the last 168 hours.  Urinalysis    Component Value Date/Time   COLORURINE YELLOW 06/27/2013  1401   APPEARANCEUR CLEAR 06/27/2013 1401   LABSPEC 1.026 06/27/2013 1401   PHURINE 5.0 06/27/2013 1401   GLUCOSEU NEGATIVE 06/27/2013 1401   HGBUR MODERATE* 06/27/2013 1401   BILIRUBINUR NEGATIVE 06/27/2013 1401   KETONESUR NEGATIVE 06/27/2013 1401   PROTEINUR NEGATIVE 06/27/2013 1401   UROBILINOGEN 0.2 06/27/2013 1401   NITRITE NEGATIVE 06/27/2013 1401   LEUKOCYTESUR NEGATIVE 06/27/2013 1401  Results for KALIMA, SAYLOR (MRN [id number]) as of 08/17/2014 16:25  Ref. Range 06/12/2014 08:37  Cholesterol Latest Range: 0-200 mg/dL 254 (H)  Triglycerides Latest Range: <150 mg/dL 84  HDL Latest Range: >39 mg/dL 84  LDL (calc) Latest Range: 0-99 mg/dL 153 (H)  VLDL Latest Range: 0-40 mg/dL 17  Total CHOL/HDL Ratio Latest Units: Ratio 3.0    STUDIES: No results found.   ASSESSMENT: 60 y.o. [place] woman status post right lumpectomy and sentinel lymph node sampling 02/22/2014 for a pT1c pN0, stage IA invasive ductal carcinoma, grade 1 estrogen receptor 100% positive, progesterone receptor 85% positive, with no HER-2 amplification and an MIB-1 of 12%  (1) OncotypeDX 02/28/2014 score of 8 predicts an outside-the-breast recurrence of 6% if the patient's only systemic treatment is tamoxifen for 5 years. It also predicts no benefit from chemotherapy  (2) completed adjuvant radiation 04/27/2014: 4250 cGy to the right breast with a 1000 cGy boost to the scar  (3) started tamoxifen January 2015    PLAN: Pamela Underwood is now little over a year out from her definitive surgery with no evidence of disease recurrence. She is tolerating tamoxifen well. Given her excellent prognosis, possibly 5 years of tamoxifen is all she will need to do.  She is also doing well on the Crestor. I have set her up for a lipid panel next week to make sure the LDL is less than 100. I will send her a copy of that report.  Otherwise she will see Dr. Donne Hazel the summer and then she will see me again in October after her  September mammography. From that point we will start seeing her on a once a year basis.  I strongly encouraged her to resume her previously excellent exercise program.  She knows to call for any problems that may develop before her next visit here.  Chauncey Cruel, MD   06/13/2015 3:59 PM

## 2015-06-14 ENCOUNTER — Telehealth: Payer: Self-pay | Admitting: Oncology

## 2015-06-14 NOTE — Telephone Encounter (Signed)
Left message for patient about appointments per Dr. Jana Hakim.

## 2015-06-20 ENCOUNTER — Other Ambulatory Visit: Payer: 59

## 2015-06-20 ENCOUNTER — Other Ambulatory Visit: Payer: Self-pay | Admitting: *Deleted

## 2015-06-22 MED FILL — ZOLPIDEM TARTRATE 10 MG TAB: 10 | 30 days supply | Qty: 30 | Fill #3

## 2015-06-22 MED FILL — ALPRAZolam 0.25 MG TABS: 0.25 | 30 days supply | Qty: 30 | Fill #3

## 2015-06-22 MED FILL — ROSUVASTATIN CALCIUM 5 MG T: 5 | 30 days supply | Qty: 30 | Fill #1

## 2015-06-22 MED FILL — DILTIAZEM 24HR ER 120 MG CA: 120 | 30 days supply | Qty: 30 | Fill #2

## 2015-06-22 MED FILL — PROPAFENONE HCL ER 225 MG C: 225 | 30 days supply | Qty: 60 | Fill #1

## 2015-07-03 MED FILL — TAMOXIFEN 20 MG TABLET: 20 | 90 days supply | Qty: 90 | Fill #0

## 2015-07-11 DIAGNOSIS — G4733 Obstructive sleep apnea (adult) (pediatric): Secondary | ICD-10-CM | POA: Diagnosis not present

## 2015-07-23 MED FILL — ZOLPIDEM TARTRATE 10 MG TAB: 10 | 30 days supply | Qty: 30 | Fill #4

## 2015-07-26 MED FILL — ALPRAZolam 0.25 MG TABS: 0.25 | 30 days supply | Qty: 30 | Fill #0

## 2015-08-06 MED FILL — CARTIA XT 120 MG CAPSULE: 120 | 90 days supply | Qty: 90 | Fill #3

## 2015-08-06 MED FILL — PROPAFENONE HCL ER 225 MG C: 225 | 30 days supply | Qty: 60 | Fill #2

## 2015-08-06 MED FILL — ROSUVASTATIN CALCIUM 5 MG T: 5 | 30 days supply | Qty: 30 | Fill #2

## 2015-08-22 MED FILL — ZOLPIDEM TARTRATE 10 MG TAB: 10 | 30 days supply | Qty: 30 | Fill #5

## 2015-08-27 MED FILL — ALPRAZolam 0.25 MG TABS: 0.25 | 30 days supply | Qty: 30 | Fill #1

## 2015-09-06 MED FILL — PROPAFENONE HCL ER 225 MG C: 225 | 30 days supply | Qty: 60 | Fill #3

## 2015-09-25 MED FILL — ZOLPIDEM TARTRATE 10 MG TAB: 10 | 30 days supply | Qty: 30 | Fill #0

## 2015-10-02 MED FILL — TAMOXIFEN 20 MG TABLET: 20 | 90 days supply | Qty: 90 | Fill #1

## 2015-10-02 MED FILL — PROPAFENONE HCL ER 225 MG C: 225 | 30 days supply | Qty: 60 | Fill #4

## 2015-10-02 MED FILL — ALPRAZolam 0.25 MG TABS: 0.25 | 30 days supply | Qty: 30 | Fill #2

## 2015-10-11 DIAGNOSIS — G4733 Obstructive sleep apnea (adult) (pediatric): Secondary | ICD-10-CM | POA: Diagnosis not present

## 2015-10-15 DIAGNOSIS — Z6831 Body mass index (BMI) 31.0-31.9, adult: Secondary | ICD-10-CM | POA: Diagnosis not present

## 2015-10-15 DIAGNOSIS — Z01419 Encounter for gynecological examination (general) (routine) without abnormal findings: Secondary | ICD-10-CM | POA: Diagnosis not present

## 2015-10-16 MED FILL — SERTRALINE HCL 50 MG TABLET: 50 | 30 days supply | Qty: 30 | Fill #0

## 2015-10-26 MED FILL — PROPAFENONE HCL ER 225 MG C: 225 | 30 days supply | Qty: 60 | Fill #5

## 2015-10-26 MED FILL — ZOLPIDEM TARTRATE 10 MG TAB: 10 | 30 days supply | Qty: 30 | Fill #0

## 2015-10-26 MED FILL — CARTIA XT 120 MG CAPSULE: 120 | 90 days supply | Qty: 90 | Fill #4

## 2015-11-02 DIAGNOSIS — M25562 Pain in left knee: Secondary | ICD-10-CM | POA: Diagnosis not present

## 2015-11-02 DIAGNOSIS — M25561 Pain in right knee: Secondary | ICD-10-CM | POA: Diagnosis not present

## 2015-11-02 MED FILL — HYDROCODON-APAP 5-325: 5-325 | 15 days supply | Qty: 60 | Fill #0

## 2015-11-19 MED FILL — ALPRAZolam 0.25 MG TABS: 0.25 | 30 days supply | Qty: 30 | Fill #3

## 2015-11-26 MED FILL — ZOLPIDEM TARTRATE 10 MG TAB: 10 | 30 days supply | Qty: 30 | Fill #1

## 2015-12-07 MED FILL — PROPAFENONE HCL ER 225 MG C: 225 | 30 days supply | Qty: 60 | Fill #6

## 2015-12-27 MED FILL — ZOLPIDEM TARTRATE 10 MG TAB: 10 | 30 days supply | Qty: 30 | Fill #2

## 2015-12-27 MED FILL — TAMOXIFEN 20 MG TABLET: 20 | 90 days supply | Qty: 90 | Fill #2

## 2015-12-27 MED FILL — ALPRAZolam 0.25 MG TABS: 0.25 | 30 days supply | Qty: 30 | Fill #4

## 2016-01-09 MED FILL — PROPAFENONE HCL ER 225 MG C: 225 | 30 days supply | Qty: 60 | Fill #7

## 2016-01-09 MED FILL — ROSUVASTATIN CALCIUM 5 MG T: 5 | 90 days supply | Qty: 90 | Fill #0

## 2016-01-16 DIAGNOSIS — G4733 Obstructive sleep apnea (adult) (pediatric): Secondary | ICD-10-CM | POA: Diagnosis not present

## 2016-01-18 DIAGNOSIS — M1711 Unilateral primary osteoarthritis, right knee: Secondary | ICD-10-CM | POA: Diagnosis not present

## 2016-01-21 MED FILL — HYDROCODON-APAP 5-325: 5-325 | 14 days supply | Qty: 40 | Fill #0

## 2016-01-25 MED FILL — ZOLPIDEM TARTRATE 10 MG TAB: 10 | 30 days supply | Qty: 30 | Fill #3

## 2016-02-01 DIAGNOSIS — M1711 Unilateral primary osteoarthritis, right knee: Secondary | ICD-10-CM | POA: Diagnosis not present

## 2016-02-06 ENCOUNTER — Other Ambulatory Visit: Payer: Self-pay | Admitting: *Deleted

## 2016-02-06 MED ORDER — DILTIAZEM HCL ER COATED BEADS 120 MG PO CP24: 120.0000 mg | ORAL_CAPSULE | Freq: Every day | ORAL | 0 refills | Status: DC

## 2016-02-06 MED ORDER — PROPAFENONE HCL ER 225 MG PO CP12: ORAL_CAPSULE | ORAL | 1 refills | Status: DC

## 2016-02-06 MED FILL — PROPAFENONE HCL ER 225 MG C: 225 | 90 days supply | Qty: 180 | Fill #0

## 2016-02-06 MED FILL — CARTIA XT 120 MG CAPSULE SA: 120 | 90 days supply | Qty: 90 | Fill #0

## 2016-02-07 ENCOUNTER — Telehealth: Payer: Self-pay

## 2016-02-07 NOTE — Telephone Encounter (Signed)
The pt is scheduled to have right total knee replacement on 03/12/16 with Dr Kathryne Hitch.  They are requesting that the pt stop taking ASA 81 mg prior to procedure. When should the pt stop ASA prior to surgery?  Please advise.

## 2016-02-08 NOTE — Telephone Encounter (Signed)
OK to stop aspirin 7 days prior to surgery.

## 2016-02-11 NOTE — Telephone Encounter (Signed)
**Note De-Identified Mars Scheaffer Obfuscation** This message has been faxed to Raliegh Ip at [phone] ATTN: Judeen Hammans. I did receive confirmation that the fax was successful.

## 2016-02-21 ENCOUNTER — Other Ambulatory Visit: Payer: Self-pay | Admitting: *Deleted

## 2016-02-21 DIAGNOSIS — C50211 Malignant neoplasm of upper-inner quadrant of right female breast: Secondary | ICD-10-CM

## 2016-02-25 ENCOUNTER — Other Ambulatory Visit: Payer: 59

## 2016-02-25 ENCOUNTER — Ambulatory Visit: Payer: 59 | Admitting: Oncology

## 2016-02-25 DIAGNOSIS — M1711 Unilateral primary osteoarthritis, right knee: Secondary | ICD-10-CM | POA: Diagnosis not present

## 2016-02-25 DIAGNOSIS — R921 Mammographic calcification found on diagnostic imaging of breast: Secondary | ICD-10-CM | POA: Diagnosis not present

## 2016-02-25 DIAGNOSIS — Z853 Personal history of malignant neoplasm of breast: Secondary | ICD-10-CM | POA: Diagnosis not present

## 2016-02-25 MED FILL — ALPRAZolam 0.25 MG TABS: 0.25 | 30 days supply | Qty: 30 | Fill #0

## 2016-02-25 MED FILL — HYDROCODON-APAP 5-325: 5-325 | 5 days supply | Qty: 20 | Fill #0

## 2016-02-25 MED FILL — ZOLPIDEM TARTRATE 10 MG TAB: 10 | 30 days supply | Qty: 30 | Fill #4

## 2016-02-28 ENCOUNTER — Encounter (HOSPITAL_COMMUNITY): Payer: Self-pay

## 2016-02-28 NOTE — Pre-Procedure Instructions (Signed)
Pamela Underwood  02/28/2016     Your procedure is scheduled on : Wednesday March 12, 2016 at 9:45 AM.  Report to [facility] Admitting at 7:45 AM.  Call this number if you have problems the morning of surgery: [phone]    Remember:  Do not eat food or drink liquids after midnight.  Take these medicines the morning of surgery with A SIP OF WATER : Alprazolam (Xanax) if needed, Diltiazem (Cardizem), Propafenone (Rythmol), Tamoxifen   Stop taking any vitamins, herbal medications/supplements, NSAIDs, Ibuprofen, Advil, Motrin, Aleve, etc on Wednesday October 11th   Bring CPAP mask the day of your surgery   Do not wear jewelry, make-up or nail polish.  Do not wear lotions, powders, or perfumes, or deoderant.  Do not shave 48 hours prior to surgery.    Do not bring valuables to the hospital.  [facility] is not responsible for any belongings or valuables.  Contacts, dentures or bridgework may not be worn into surgery.  Leave your suitcase in the car.  After surgery it may be brought to your room.  For patients admitted to the hospital, discharge time will be determined by your treatment team.  Patients discharged the day of surgery will not be allowed to drive home.   Name and phone number of your driver:    Special instructions:  Shower using CHG soap the night before and the morning of your surgery  Please read over the following fact sheets that you were given.

## 2016-02-28 NOTE — H&P (Signed)
TOTAL KNEE ADMISSION H&P 2 Patient is being admitted for right total knee arthroplasty.  Subjective:  Chief Complaint:right knee pain.  HPI: Pamela Underwood, 60 y.o. female, has a history of pain and functional disability in the right knee due to arthritis and has failed non-surgical conservative treatments for greater than 12 weeks to includeNSAID's and/or analgesics, corticosteriod injections, viscosupplementation injections and activity modification.  Onset of symptoms was gradual, starting 15 years ago with gradually worsening course since that time. The patient noted no past surgery on the left knee(s).  Patient currently rates pain in the left knee(s) at 7 out of 10 with activity. Patient has night pain, worsening of pain with activity and weight bearing, pain that interferes with activities of daily living, pain with passive range of motion, crepitus and joint swelling.  Patient has evidence of subchondral sclerosis, periarticular osteophytes and joint space narrowing by imaging studies.  There is no active infection.  Patient Active Problem List   Diagnosis Date Noted  . Obesity (BMI 35.0-39.9 without comorbidity) 06/13/2015  . Elevated BP 06/06/2015  . Elevated LDL cholesterol level 08/17/2014  . Breast cancer of upper-inner quadrant of right female breast ([place]) 02/28/2014  . DJD (degenerative joint disease) of knee 06/29/2013  . Paroxysmal supraventricular tachycardia ([place]) 05/09/2013  . Fatigue 05/09/2013  . Screening for hypercholesterolemia 05/09/2013  . OSA (obstructive sleep apnea)   . Obesity (BMI 30-39.9)    Past Medical History:  Diagnosis Date  . Arthritis   . Atrial tachycardia ([place])    takes rythmol daily and cardiazem daily  . Back pain    occasionally  . Breast cancer ([place]) 02/22/14   right upper inner  . Chronic anxiety    takes Xanax daily as needed when heart rate going up  . Colitis    hx-one  . Dysrhythmia    atrial tachycardia- takes meds and  controls it  . Elevated BP 06/06/2015  . Insomnia    takes Ambien nightly as needed  . Joint pain   . Joint swelling   . S/P radiation therapy 03/28/2014 through 04/27/2014                                03/28/2014 through 04/27/2014                                                                         Right breast 4250 cGy 17 sessions, right breast boost 1000 cGy in 4 sessions (hypofractionated)                         . Sleep apnea    uses a CPAP    Past Surgical History:  Procedure Laterality Date  . BREAST BIOPSY Left 05/02/1999   benign  . BREAST LUMPECTOMY WITH AXILLARY LYMPH NODE BIOPSY Right 02/22/14   upper inner  . COLONOSCOPY    . CYSTOSCOPY    . KNEE ARTHROSCOPY WITH MEDIAL MENISECTOMY Left 03/31/2013   Procedure: LEFT KNEE ARTHROSCOPY WITH PARTIAL MEDIAL MENISECTOMY, CHONDROPLASTY OF PATELLA  FEMORAL JOINT, EXCISION OF MEDIAL PLICA, PARTIAL LATERAL MENISECTOMY;  Surgeon: [name], MD;  Location: [facility];  Service: Orthopedics;  Laterality: Left;  . TOTAL KNEE ARTHROPLASTY Left 06/29/2013   Procedure: TOTAL KNEE ARTHROPLASTY;  Surgeon: [name], MD;  Location: [place];  Service: Orthopedics;  Laterality: Left;  . TUBAL LIGATION      No prescriptions prior to admission.   Allergies  Allergen Reactions  . Escitalopram Oxalate Other (See Comments)    Hives, welts, all over skin rash    Social History  Substance Use Topics  . Smoking status: Never Smoker  . Smokeless tobacco: Never Used  . Alcohol use Yes     Comment: socailly    Family History  Problem Relation Age of Onset  . Heart disease Father   . Hypertension Father      Review of Systems  Eyes: Negative.   Respiratory: Negative.   Cardiovascular: Negative.   Gastrointestinal: Negative.   Genitourinary: Negative.   Musculoskeletal: Positive for joint pain.  Neurological: Negative.   Endo/Heme/Allergies: Negative.   Psychiatric/Behavioral: Negative.   Sleep Apnea on  CPAP  Objective:  Physical Exam  Constitutional: She appears well-developed and well-nourished.  HENT:  Head: Normocephalic and atraumatic.  Right Ear: External ear normal.  Left Ear: External ear normal.  Eyes: Conjunctivae and EOM are normal. Pupils are equal, round, and reactive to light.  Neck: No thyromegaly present.  Cardiovascular: Normal rate, regular rhythm, normal heart sounds and intact distal pulses.   Respiratory: Effort normal and breath sounds normal.  GI: Soft. Bowel sounds are normal.  Musculoskeletal:  Decreased ROM with motion.  Tenderness diffusely about the knee.  Mild effusion    Vital signs in last 24 hours:    Labs:   Estimated body mass index is 30.46 kg/m as calculated from the following:   Height as of 02/29/16: 5\' 7"  (1.702 m).   Weight as of 02/29/16: 88.2 kg (194 lb 8 oz).   Imaging Review Plain radiographs demonstrate moderate degenerative joint disease of the left knee(s). The overall alignment ismild varus. The bone quality appears to be good for age and reported activity level.  Assessment/Plan:  End stage arthritis, left knee   The patient history, physical examination, clinical judgment of the provider and imaging studies are consistent with end stage degenerative joint disease of the left knee(s) and total knee arthroplasty is deemed medically necessary. The treatment options including medical management, injection therapy arthroscopy and arthroplasty were discussed at length. The risks and benefits of total knee arthroplasty were presented and reviewed. The risks due to aseptic loosening, infection, stiffness, patella tracking problems, thromboembolic complications and other imponderables were discussed. The patient acknowledged the explanation, agreed to proceed with the plan and consent was signed. Patient is being admitted for inpatient treatment for surgery, pain control, PT, OT, prophylactic antibiotics, VTE prophylaxis, progressive  ambulation and ADL's and discharge planning. The patient is planning to be discharged home with home health services

## 2016-02-29 ENCOUNTER — Encounter (HOSPITAL_COMMUNITY)
Admission: RE | Admit: 2016-02-29 | Discharge: 2016-02-29 | Disposition: A | Discharge status: Home / Self Care | Payer: 59 | Source: Ambulatory Visit | Attending: Orthopedic Surgery | Admitting: Orthopedic Surgery

## 2016-02-29 ENCOUNTER — Ambulatory Visit (HOSPITAL_COMMUNITY)
Admission: RE | Admit: 2016-02-29 | Discharge: 2016-02-29 | Disposition: A | Discharge status: Home / Self Care | Payer: 59 | Source: Ambulatory Visit | Attending: Orthopedic Surgery | Admitting: Orthopedic Surgery

## 2016-02-29 ENCOUNTER — Encounter (HOSPITAL_COMMUNITY): Payer: Self-pay

## 2016-02-29 DIAGNOSIS — Z01818 Encounter for other preprocedural examination: Secondary | ICD-10-CM | POA: Diagnosis not present

## 2016-02-29 LAB — TYPE AND SCREEN
ABO/RH(D): A NEG
ANTIBODY SCREEN: NEGATIVE

## 2016-02-29 LAB — APTT: APTT: 26 s (ref 24–36)

## 2016-02-29 LAB — CBC WITH DIFFERENTIAL/PLATELET
BASOS ABS: 0 10*3/uL (ref 0.0–0.1)
BASOS PCT: 0 %
EOS ABS: 0.1 10*3/uL (ref 0.0–0.7)
Eosinophils Relative: 2 %
HCT: 40.1 % (ref 36.0–46.0)
HEMOGLOBIN: 13.7 g/dL (ref 12.0–15.0)
Lymphocytes Relative: 40 %
Lymphs Abs: 2.9 10*3/uL (ref 0.7–4.0)
MCH: 32.2 pg (ref 26.0–34.0)
MCHC: 34.2 g/dL (ref 30.0–36.0)
MCV: 94.4 fL (ref 78.0–100.0)
MONO ABS: 0.6 10*3/uL (ref 0.1–1.0)
MONOS PCT: 9 %
NEUTROS ABS: 3.5 10*3/uL (ref 1.7–7.7)
NEUTROS PCT: 49 %
Platelets: 171 10*3/uL (ref 150–400)
RBC: 4.25 MIL/uL (ref 3.87–5.11)
RDW: 12 % (ref 11.5–15.5)
WBC: 7.2 10*3/uL (ref 4.0–10.5)

## 2016-02-29 LAB — URINALYSIS, ROUTINE W REFLEX MICROSCOPIC
Bilirubin Urine: NEGATIVE
GLUCOSE, UA: NEGATIVE mg/dL
Ketones, ur: NEGATIVE mg/dL
LEUKOCYTES UA: NEGATIVE
Nitrite: NEGATIVE
Protein, ur: NEGATIVE mg/dL
SPECIFIC GRAVITY, URINE: 1.003 — AB (ref 1.005–1.030)
pH: 6 (ref 5.0–8.0)

## 2016-02-29 LAB — URINE MICROSCOPIC-ADD ON
BACTERIA UA: NONE SEEN
RBC / HPF: NONE SEEN RBC/hpf (ref 0–5)

## 2016-02-29 LAB — COMPREHENSIVE METABOLIC PANEL
ALBUMIN: 4.2 g/dL (ref 3.5–5.0)
ALT: 20 U/L (ref 14–54)
ANION GAP: 10 (ref 5–15)
AST: 23 U/L (ref 15–41)
Alkaline Phosphatase: 53 U/L (ref 38–126)
BUN: 11 mg/dL (ref 6–20)
CO2: 25 mmol/L (ref 22–32)
Calcium: 8.9 mg/dL (ref 8.9–10.3)
Chloride: 106 mmol/L (ref 101–111)
Creatinine, Ser: 0.67 mg/dL (ref 0.44–1.00)
GFR calc Af Amer: >60 mL/min (ref >60)
GFR calc non Af Amer: >60 mL/min (ref >60)
GLUCOSE: 100 mg/dL — AB (ref 65–99)
POTASSIUM: 3.6 mmol/L (ref 3.5–5.1)
SODIUM: 141 mmol/L (ref 135–145)
Total Bilirubin: 0.2 mg/dL — ABNORMAL LOW (ref 0.3–1.2)
Total Protein: 6.7 g/dL (ref 6.5–8.1)

## 2016-02-29 LAB — SURGICAL PCR SCREEN
MRSA, PCR: NEGATIVE
STAPHYLOCOCCUS AUREUS: NEGATIVE

## 2016-02-29 LAB — PROTIME-INR
INR: 0.92
Prothrombin Time: 12.4 seconds (ref 11.4–15.2)

## 2016-02-29 NOTE — Progress Notes (Signed)
HR was rechecked and it was 124. Nurse called and informed Ebony Hail, [place] of this. Ebony Hail, [place] stated that a EKG should be obtained since patients HR was greater than 120. Nurse then informed patient of this and patient stated "I'm not worried...Pamela KitchenMarland Kitchenthis happened back in January when I went to see Dr. Radford Pax. She got a EKG and said everything was fine. I'll go home and take my Rhythmol and if Dr. Irish Lack wants to take a EKG on me when I go on the 13th, then he can." Patient denied having any current chest pain or discomfort.

## 2016-02-29 NOTE — Progress Notes (Signed)
PCP is Everlene Farrier  Cardiologist is [facility]. Next scheduled appointment is October 13th.  Patient denied having any acute cardiac or pulmonary issues.  HR upon arrival to PAT was 127. Will recheck HR before patient leaves PAT.   Patient has OSA, and Nurse instructed patient to bring CPAP mask DOS. Patient verbalized understanding.

## 2016-03-01 LAB — URINE CULTURE: Culture: <10000 — AB

## 2016-03-01 NOTE — H&P (Addendum)
CHIEF COMPLAINT:   Painful right knee.    HPI: Pamela Underwood is a very pleasant 60 year old white female who is seen today for evaluation of her right knee.   She is status post left total knee arthroplasty and has done well with that back in 2015.  However now she has started having worsening pain in her right knee.  She most recently has had a corticosteroid injection on 11/02/2015.  Unfortunately that did not bode well and she did have continued pain and discomfort with weightbearing, also pain waking her at nighttime.  Option would have been to replace the knee or try debriding arthroscopy.  On 01/10/2016, she was given viscosupplementation and that followed with a total of 3 weeks.  She states that this did not give much relief at all.  She is very miserable and despite the fact she is still working.  She is limited throughout her day with pain and discomfort.  She continues with this where she has pain with every step, nighttime pain, as well as pain with activities of daily living.  This started about 15 years ago and has gradually worsened.  She states her pain is 7/10 with activity.  She is seen today for evaluation.   Past medical history: General health is good.   Hospitalizations: 1. 1994 for tubal ligation. 2. 2014 for left knee arthroscopy. 3. 2015 for left total knee replacement. 4. 2015 for right breast lumpectomy.   [place], 1994. 6. 2014 for colitis following a trip to [place].  7. 2010 adverse reaction to Lexapro.  Medications: 1. Rythmol 225 b.i.d. 2. Cardizem q.d.  3. Aspirin 81 mg. q.d. 4. Vitamin D q.d. 5. Ambien 10 mg. p.r.n. h.s. 6. Tamoxifen 10 mg. q.d.    ALLERGIES:  LEXAPRO.  14-point review of systems is positive for glasses.  She has had breast cancer and has had lumpectomies and most recent mammogram was negative for carcinoma.  She does get easy bruising from her aspirin. She does have sleep apnea and uses CPAP.  Family history is positive for mother  deceased at age 53 from an accident.  Father deceased at age 30 from heart attack, he also had high blood pressure and diabetes.  She has no brothers.  She does have a sister who is 109 years old and does have bladder cancer.   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  She denies the use of tobacco.  She does socially drink.    EXAMINATION: Head is normocephalic. Eyes: PERRLA with EOM's intact.  Ears, nose and throat: benign. Neck: supple, no carotid bruits. Chest:  good expansion. Lungs:  essentially clear. Cardiac: regular rate and rhythm.  Normal S1 and S2.  No murmurs noted. Pulses: 1+ bilateral and symmetric in the lower extremities. Abdomen: scaphoid, soft, non-tender, no masses palpable.  Normal bowel sounds present.   CNS:  Cranial nerve II-XII grossly intact. Musculoskeletal:  Range of motion is 0 to 105 degrees.  She does have about 5 degrees varus position.   Tibiofemoral and patellofemoral crepitus with range of motion. 1-2+ effusion.  Neurovascularly intact distally.    CLINICAL IMPRESSION: 1. End-stage osteoarthritis of the right knee.   2. History of breast cancer.   3. History of sleep apnea.  RECOMMENDATIONS: At this time, our plan is to proceed with a right total knee arthroplasty.  Procedure, risks and benefits were fully explained to her and she is understanding.  Appropriate models were used  to demonstrate the prosthesis.  All questions were answered in detail.  Mike Craze Deavion Dobbs, PA-C  03/11/2016 6:07 PM

## 2016-03-03 ENCOUNTER — Telehealth: Payer: Self-pay | Admitting: Oncology

## 2016-03-03 ENCOUNTER — Ambulatory Visit: Payer: 59 | Admitting: Oncology

## 2016-03-03 NOTE — Progress Notes (Addendum)
Anesthesia Chart Review: Patient is a 60 year old female scheduled for right TKR on 03/12/16 by Dr. Kathryne Hitch.  History includes non-smoker, atrial tachycardia/PSVT, elevated blood pressure, OSA (with CPAP), chronic anxiety, colitis, insomnia, arthritis, s/p left TKA 06/29/13, right breast cancer s/p right partial mastectomy and radiation.   PCP is listed as GYN Dr. Everlene Farrier. Cardiologist is Dr. Casandra Doffing, last visit 02/01/15 with next visit scheduled for 03/07/16. She also sees Dr. Fransico Him for OSA, last visit 06/06/15. She had an EKG then due to mild tachycardia (HR low 100's). She has declined SVT ablation in the past. Rhythm as been controlled on diltiazem and Rhythmol. HEM-ONC is Dr. Jana Hakim.  Meds Xanax, ASA 81 mg, diltiazem, propafenone (Rythmol), Crestor, tamoxifen, Ambien. Okay to stop ASA 7 days prior to surgery per Dr. Irish Lack.   BP (!) 158/88   Pulse (!) 127   Temp 36.9 C (Oral)   Resp 18   Ht 5\' 7"  (1.702 m)   Wt 194 lb 8 oz (88.2 kg)   SpO2 97%   BMI 30.46 kg/m   06/06/15 EKG: ST at 104 bpm, first degree AV block, anterolateral infarct (age undetermined).  06/14/09 Echo: Findings: LV with normal wall thickness, chamber size and contraction. No regional wall motion abnormalities. EF estimated 66.9%. Normal 2-D echo.   07/18/10 Exercise stress test showed no ECG changes indicative of ischemia. She had a normal nuclear stress test in 2006.  02/29/16 CXR: IMPRESSION: No edema or consolidation.  Preoperative labs noted. Insignificant growth on urine culture.  I was notified that while patient was at PAT her HR was 124-127 bpm. She denied chest pain, SOB, syncope/pre-syncope. She was not hypotensive. I advised to get an EKG to evaluate underlying rhythm, but she refused. She had not taken her Rhythmol yet that morning and wanted to go home and take Rhythmol and re-evaluate HR at home. She has an appointment with Dr. Irish Lack this week and said she will reconsider  getting a repeat EKG at that time if he feels one is indicated. I will leave chart for follow-up cardiology recommendations.  George Hugh [facility] Stay [facility]/Anesthesiology Phone [phone] 03/03/2016 3:57 PM  Addendum: I reviewed Dr. Hassell Done 03/07/16 office note. As above, he is already aware of surgery plans.  She was still tachycardic (125 bpm) with vitals on presentation. She reported her HR increases with emotional stress and going to doctor's visits. She wears a watch heart monitor. I don't see an EKG yet in Epic, but his note states, "The ekg ordered today demonstrates  Normal sinus rhythm , nonspecific ST segment changes , QTC 459 ms, unchanged from 2015 ECG." He did think some of her ST on 05/2015 tracing looked like MAT. He increased her Cardizem from 120 mg to 240 mg daily. Hopefully the increase will help with rate control. She will get vitals on arrival and evaluation by her anesthesiologist on the day of surgery. Definitive anesthesia plan at that time.    George Hugh [facility] Short Stay Center/Anesthesiology Phone [phone] 03/10/2016 9:24 AM

## 2016-03-03 NOTE — Telephone Encounter (Signed)
05/27/2016 Appointment scheduled per patient request. The patient could not make the 10/09 Appointment per her work schedule. The patient stated that she would be available on 10/18 or in January only.

## 2016-03-06 NOTE — Progress Notes (Signed)
Patient ID: Pamela Underwood, female   DOB: 1956-03-16, 60 y.o.   MRN: [id number]     Cardiology Office Note   Date:  03/07/2016   ID:  Pamela Underwood, DOB 26-Jun-1955, MRN [id number]  PCP:  Allena Katz, MD    No chief complaint on file. f/u SVT   Wt Readings from Last 3 Encounters:  03/07/16 190 lb 12.8 oz (86.5 kg)  02/29/16 194 lb 8 oz (88.2 kg)  06/13/15 196 lb 12.8 oz (89.3 kg)       History of Present Illness: Pamela Underwood is a 60 y.o. female  who has had SVT. Controlled on propafenone and diltiazem.  She has declined evaluation for ablation in the past. She had breast cancer treated with surgery and radiation.  She had no SVT with that process.  She is back to exercising but then had some breast pain.  She has cut back on the exercise and this resolved with less exercise.  SHe has gained weight.      Overall, she feels well from a cardiac standpoint. She needs a refill on her propafenone.  She noted that her HR increases with emotional stress, and coming to some MDs visits.   She wears a watch heart monitor.     She has been under stress because her husband lost his job and cannot find another.         Past Medical History:  Diagnosis Date  . Arthritis   . Atrial tachycardia ([place])    takes rythmol daily and cardiazem daily  . Back pain    occasionally  . Breast cancer ([place]) 02/22/14   right upper inner  . Chronic anxiety    takes Xanax daily as needed when heart rate going up  . Colitis    hx-one  . Dysrhythmia    atrial tachycardia- takes meds and controls it  . Elevated BP 06/06/2015  . Insomnia    takes Ambien nightly as needed  . Joint pain   . Joint swelling   . S/P radiation therapy 03/28/2014 through 04/27/2014                                03/28/2014 through 04/27/2014                                                                         Right breast 4250 cGy 17 sessions, right breast boost 1000 cGy in 4 sessions (hypofractionated)                          . Sleep apnea    uses a CPAP    Past Surgical History:  Procedure Laterality Date  . BREAST BIOPSY Left 05/02/1999   benign  . BREAST LUMPECTOMY WITH AXILLARY LYMPH NODE BIOPSY Right 02/22/14   upper inner  . COLONOSCOPY    . CYSTOSCOPY    . KNEE ARTHROSCOPY WITH MEDIAL MENISECTOMY Left 03/31/2013   Procedure: LEFT KNEE ARTHROSCOPY WITH PARTIAL MEDIAL MENISECTOMY, CHONDROPLASTY OF PATELLA  FEMORAL JOINT, EXCISION OF MEDIAL PLICA, PARTIAL LATERAL MENISECTOMY;  Surgeon: [name], MD;  Location: [facility];  Service: Orthopedics;  Laterality: Left;  . TOTAL KNEE ARTHROPLASTY Left 06/29/2013   Procedure: TOTAL KNEE ARTHROPLASTY;  Surgeon: [name], MD;  Location: [facility];  Service: Orthopedics;  Laterality: Left;  . TUBAL LIGATION       Current Outpatient Prescriptions  Medication Sig Dispense Refill  . ALPRAZolam (XANAX) 0.25 MG tablet Take 0.25 mg by mouth daily as needed for anxiety.   4  . aspirin 81 MG tablet Take 81 mg by mouth every morning.     . diltiazem (CARDIZEM CD) 120 MG 24 hr capsule Take 1 capsule (120 mg total) by mouth daily. (Patient taking differently: Take 120 mg by mouth at bedtime. ) 90 capsule 0  . propafenone (RYTHMOL SR) 225 MG 12 hr capsule TAKE 1 CAPSULE BY MOUTH EVERY 12 HOURS (Patient taking differently: Take 225 mg by mouth 2 (two) times daily. ) 180 capsule 1  . rosuvastatin (CRESTOR) 5 MG tablet Take 1 tablet (5 mg total) by mouth daily. 90 tablet 2  . tamoxifen (NOLVADEX) 20 MG tablet Take 1 tablet (20 mg total) by mouth daily. 90 tablet 3  . VITAMIN D, CHOLECALCIFEROL, PO Take 1,000 Units by mouth daily.     Marland Kitchen zolpidem (AMBIEN) 10 MG tablet Take 10 mg by mouth at bedtime as needed for sleep.      No current facility-administered medications for this visit.     Allergies:   Escitalopram oxalate    Social History:  The patient  reports that she has never smoked. She has never used smokeless tobacco. She  reports that she drinks alcohol. She reports that she does not use drugs.   Family History:  The patient's family history includes Diabetes in her father; Heart disease in her father; Hypertension in her father.    ROS:  Please see the history of present illness.   Otherwise, review of systems are positive for  Breast pain post surgery.   All other systems are reviewed and negative.    PHYSICAL EXAM: VS:  BP (!) 140/100   Pulse (!) 125   Ht 5\' 7"  (1.702 m)   Wt 190 lb 12.8 oz (86.5 kg)   BMI 29.88 kg/m  , BMI Body mass index is 29.88 kg/m. GEN: Well nourished, well developed, in no acute distress  HEENT: normal  Neck: no JVD, carotid bruits, or masses Cardiac: RRR; no murmurs, rubs, or gallops,no edema  Respiratory:  clear to auscultation bilaterally, normal work of breathing GI: soft, nontender, nondistended, + BS MS: no deformity or atrophy  Skin: warm and dry, no rash Neuro:  Strength and sensation are intact Psych: euthymic mood, full affect   EKG:   The ekg ordered today demonstrates  Normal sinus rhythm , nonspecific ST segment changes , QTC 459 ms, unchanged from 2015 ECG   Recent Labs: 02/29/2016: ALT 20; BUN 11; Creatinine, Ser 0.67; Hemoglobin 13.7; Platelets 171; Potassium 3.6; Sodium 141   Lipid Panel    Component Value Date/Time   CHOL 254 (H) 06/12/2014 0837   TRIG 84 06/12/2014 0837   HDL 84 06/12/2014 0837   CHOLHDL 3.0 06/12/2014 0837   VLDL 17 06/12/2014 0837   LDLCALC 153 (H) 06/12/2014 0837     Other studies Reviewed: Additional studies/ records that were reviewed today with results demonstrating: 1/17 ECG reviewed.   ASSESSMENT AND PLAN:  1. SVT: Maintaining NSR for the most part.  Increased palpitations, but they have correlated to NSR per her report.  COntinue rhythmol and diltiazem. Increase diltiazem to 240 mg daily as she is having more sx of tachycardia.  Some of this is sinus tach and the ECG in Jan 2017 looks like MAT.   She will let us  know if she has any further symptoms.  She has knee surgery next week.  2. OSA:  Continue using C Pap. Hopefully, she will be able to resume exercise after her rehab from knee surgery is done, and this will help with weight loss.  She saw Dr. Radford Pax. 3. Hyperlipidemia: COntinue Crestor.   Current medicines are reviewed at length with the patient today.  The patient concerns regarding her medicines were addressed.  The following changes have been made:  No change  Labs/ tests ordered today include:  No orders of the defined types were placed in this encounter.   Recommend 150 minutes/week of aerobic exercise Low fat, low carb, high fiber diet recommended  Disposition:   FU in 1 year   Signed, Larae Grooms, MD  03/07/2016 10:48 AM    [facility] [address], [place], [place]  [address] Phone: [phone]; Fax: [phone]

## 2016-03-07 ENCOUNTER — Ambulatory Visit (INDEPENDENT_AMBULATORY_CARE_PROVIDER_SITE_OTHER): Payer: 59 | Admitting: Interventional Cardiology

## 2016-03-07 ENCOUNTER — Encounter: Payer: Self-pay | Admitting: Interventional Cardiology

## 2016-03-07 VITALS — BP 140/100 | HR 125 | Ht 67.0 in | Wt 190.8 lb

## 2016-03-07 DIAGNOSIS — E7800 Pure hypercholesterolemia, unspecified: Secondary | ICD-10-CM | POA: Diagnosis not present

## 2016-03-07 DIAGNOSIS — G4733 Obstructive sleep apnea (adult) (pediatric): Secondary | ICD-10-CM | POA: Diagnosis not present

## 2016-03-07 DIAGNOSIS — I471 Supraventricular tachycardia: Secondary | ICD-10-CM | POA: Diagnosis not present

## 2016-03-07 MED ORDER — DILTIAZEM HCL ER COATED BEADS 240 MG PO CP24: 240.0000 mg | ORAL_CAPSULE | Freq: Every day | ORAL | 3 refills | Status: DC

## 2016-03-07 NOTE — Patient Instructions (Signed)
Medication Instructions:  Increase Cardizem to 240 mg daily. All other medications remain the same.  Labwork: None  Testing/Procedures: None  Follow-Up: Your physician wants you to follow-up in: 1 year. You will receive a reminder letter in the mail two months in advance. If you don't receive a letter, please call our office to schedule the follow-up appointment.     If you need a refill on your cardiac medications before your next appointment, please call your pharmacy.

## 2016-03-10 DIAGNOSIS — Z96651 Presence of right artificial knee joint: Secondary | ICD-10-CM | POA: Diagnosis not present

## 2016-03-10 DIAGNOSIS — G4733 Obstructive sleep apnea (adult) (pediatric): Secondary | ICD-10-CM | POA: Diagnosis not present

## 2016-03-11 MED ORDER — SODIUM CHLORIDE 0.9 % IV SOLN
INTRAVENOUS | Status: DC
Start: 2016-03-12 — End: 2016-03-12
  Administered 2016-03-12: 75 mL/h via INTRAVENOUS

## 2016-03-11 MED ORDER — CEFAZOLIN SODIUM-DEXTROSE 2-4 GM/100ML-% IV SOLN
2.0000 g | INTRAVENOUS | Status: AC
  Administered 2016-03-12: 2 g via INTRAVENOUS
  Filled 2016-03-11: qty 100

## 2016-03-11 MED ORDER — TRANEXAMIC ACID 1000 MG/10ML IV SOLN
1000.0000 mg | INTRAVENOUS | Status: AC
  Administered 2016-03-12: 1000 mg via INTRAVENOUS
  Filled 2016-03-11: qty 10

## 2016-03-11 NOTE — Anesthesia Preprocedure Evaluation (Addendum)
Anesthesia Evaluation  Patient identified by MRN, date of birth, ID band Patient awake    Reviewed: Allergy & Precautions, H&P , NPO status , Patient's Chart, lab work & pertinent test results  Airway Mallampati: III  TM Distance: >3 FB Neck ROM: Full    Dental no notable dental hx. (+) Teeth Intact, Dental Advisory Given   Pulmonary sleep apnea and Continuous Positive Airway Pressure Ventilation ,    Pulmonary exam normal breath sounds clear to auscultation       Cardiovascular + dysrhythmias  Rhythm:Regular Rate:Normal     Neuro/Psych Anxiety negative neurological ROS  negative psych ROS   GI/Hepatic negative GI ROS, Neg liver ROS,   Endo/Other  negative endocrine ROS  Renal/GU negative Renal ROS  negative genitourinary   Musculoskeletal  (+) Arthritis , Osteoarthritis,    Abdominal   Peds  Hematology negative hematology ROS (+)   Anesthesia Other Findings   Reproductive/Obstetrics negative OB ROS                            Anesthesia Physical Anesthesia Plan  ASA: III  Anesthesia Plan: Regional and General   Post-op Pain Management: GA combined w/ Regional for post-op pain   Induction: Intravenous  Airway Management Planned: LMA  Additional Equipment:   Intra-op Plan:   Post-operative Plan: Extubation in OR  Informed Consent: I have reviewed the patients History and Physical, chart, labs and discussed the procedure including the risks, benefits and alternatives for the proposed anesthesia with the patient or authorized representative who has indicated his/her understanding and acceptance.   Dental advisory given  Plan Discussed with: CRNA  Anesthesia Plan Comments:        Anesthesia Quick Evaluation

## 2016-03-12 ENCOUNTER — Inpatient Hospital Stay (HOSPITAL_COMMUNITY): Payer: 59 | Admitting: Anesthesiology

## 2016-03-12 ENCOUNTER — Inpatient Hospital Stay (HOSPITAL_COMMUNITY)
Admission: RE | Admit: 2016-03-12 | Discharge: 2016-03-14 | DRG: 470 | Disposition: A | Discharge status: Home Health | Payer: 59 | Source: Ambulatory Visit | Attending: Orthopedic Surgery | Admitting: Orthopedic Surgery

## 2016-03-12 ENCOUNTER — Encounter (HOSPITAL_COMMUNITY)
Admission: RE | Disposition: A | Discharge status: Home Health | Payer: Self-pay | Source: Ambulatory Visit | Attending: Orthopedic Surgery

## 2016-03-12 ENCOUNTER — Encounter (HOSPITAL_COMMUNITY): Payer: Self-pay | Admitting: *Deleted

## 2016-03-12 ENCOUNTER — Inpatient Hospital Stay (HOSPITAL_COMMUNITY): Payer: 59 | Admitting: Vascular Surgery

## 2016-03-12 ENCOUNTER — Inpatient Hospital Stay (HOSPITAL_COMMUNITY): Payer: 59

## 2016-03-12 DIAGNOSIS — Z6829 Body mass index (BMI) 29.0-29.9, adult: Secondary | ICD-10-CM

## 2016-03-12 DIAGNOSIS — M1711 Unilateral primary osteoarthritis, right knee: Secondary | ICD-10-CM | POA: Diagnosis not present

## 2016-03-12 DIAGNOSIS — Z471 Aftercare following joint replacement surgery: Secondary | ICD-10-CM | POA: Diagnosis not present

## 2016-03-12 DIAGNOSIS — G8918 Other acute postprocedural pain: Secondary | ICD-10-CM | POA: Diagnosis not present

## 2016-03-12 DIAGNOSIS — F419 Anxiety disorder, unspecified: Secondary | ICD-10-CM | POA: Diagnosis present

## 2016-03-12 DIAGNOSIS — Z96651 Presence of right artificial knee joint: Secondary | ICD-10-CM | POA: Diagnosis not present

## 2016-03-12 DIAGNOSIS — E669 Obesity, unspecified: Secondary | ICD-10-CM | POA: Diagnosis not present

## 2016-03-12 DIAGNOSIS — Z79899 Other long term (current) drug therapy: Secondary | ICD-10-CM

## 2016-03-12 DIAGNOSIS — I471 Supraventricular tachycardia: Secondary | ICD-10-CM

## 2016-03-12 DIAGNOSIS — Z853 Personal history of malignant neoplasm of breast: Secondary | ICD-10-CM

## 2016-03-12 DIAGNOSIS — Z888 Allergy status to other drugs, medicaments and biological substances status: Secondary | ICD-10-CM | POA: Diagnosis not present

## 2016-03-12 DIAGNOSIS — M179 Osteoarthritis of knee, unspecified: Secondary | ICD-10-CM | POA: Diagnosis not present

## 2016-03-12 DIAGNOSIS — G4700 Insomnia, unspecified: Secondary | ICD-10-CM | POA: Diagnosis present

## 2016-03-12 DIAGNOSIS — G4733 Obstructive sleep apnea (adult) (pediatric): Secondary | ICD-10-CM | POA: Diagnosis present

## 2016-03-12 DIAGNOSIS — Z96659 Presence of unspecified artificial knee joint: Secondary | ICD-10-CM

## 2016-03-12 DIAGNOSIS — M25561 Pain in right knee: Secondary | ICD-10-CM | POA: Diagnosis not present

## 2016-03-12 DIAGNOSIS — Z8249 Family history of ischemic heart disease and other diseases of the circulatory system: Secondary | ICD-10-CM | POA: Diagnosis not present

## 2016-03-12 HISTORY — PX: TOTAL KNEE ARTHROPLASTY: SHX125

## 2016-03-12 SURGERY — ARTHROPLASTY, KNEE, TOTAL
Anesthesia: Regional | Site: Knee | Laterality: Right

## 2016-03-12 MED ORDER — CELECOXIB 200 MG PO CAPS
ORAL_CAPSULE | ORAL | Status: AC
  Administered 2016-03-12: 200 mg
  Filled 2016-03-12: qty 1

## 2016-03-12 MED ORDER — FENTANYL CITRATE (PF) 100 MCG/2ML IJ SOLN
INTRAMUSCULAR | Status: AC
  Administered 2016-03-12: 100 ug
  Filled 2016-03-12: qty 2

## 2016-03-12 MED ORDER — LIDOCAINE 2% (20 MG/ML) 5 ML SYRINGE
INTRAMUSCULAR | Status: DC | PRN
  Administered 2016-03-12: 40 mg via INTRAVENOUS

## 2016-03-12 MED ORDER — METOCLOPRAMIDE HCL 5 MG PO TABS: 5.0000 mg | ORAL_TABLET | Freq: Three times a day (TID) | ORAL | Status: DC | PRN

## 2016-03-12 MED ORDER — MENTHOL 3 MG MT LOZG: 1.0000 | LOZENGE | OROMUCOSAL | Status: DC | PRN

## 2016-03-12 MED ORDER — FENTANYL CITRATE (PF) 100 MCG/2ML IJ SOLN
INTRAMUSCULAR | Status: DC | PRN
  Administered 2016-03-12 (×8): 50 ug via INTRAVENOUS

## 2016-03-12 MED ORDER — DILTIAZEM HCL ER COATED BEADS 240 MG PO CP24
240.0000 mg | ORAL_CAPSULE | Freq: Every day | ORAL | Status: DC
  Administered 2016-03-12 – 2016-03-13 (×2): 240 mg via ORAL
  Filled 2016-03-12 (×3): qty 1

## 2016-03-12 MED ORDER — HYDROMORPHONE HCL 2 MG/ML IJ SOLN
0.5000 mg | INTRAMUSCULAR | Status: DC | PRN
  Administered 2016-03-12 – 2016-03-14 (×7): 1 mg via INTRAVENOUS
  Filled 2016-03-12 (×7): qty 1

## 2016-03-12 MED ORDER — TAMOXIFEN CITRATE 10 MG PO TABS
20.0000 mg | ORAL_TABLET | Freq: Every day | ORAL | Status: DC
  Administered 2016-03-13 – 2016-03-14 (×2): 20 mg via ORAL
  Filled 2016-03-12 (×2): qty 2

## 2016-03-12 MED ORDER — CELECOXIB 200 MG PO CAPS
200.0000 mg | ORAL_CAPSULE | Freq: Two times a day (BID) | ORAL | Status: DC
  Administered 2016-03-13 – 2016-03-14 (×3): 200 mg via ORAL
  Filled 2016-03-12 (×3): qty 1

## 2016-03-12 MED ORDER — BUPIVACAINE LIPOSOME 1.3 % IJ SUSP
20.0000 mL | INTRAMUSCULAR | Status: AC
  Administered 2016-03-12: 20 mL
  Filled 2016-03-12: qty 20

## 2016-03-12 MED ORDER — SUGAMMADEX SODIUM 200 MG/2ML IV SOLN
INTRAVENOUS | Status: DC | PRN
  Administered 2016-03-12: 180 mg via INTRAVENOUS

## 2016-03-12 MED ORDER — FENTANYL CITRATE (PF) 100 MCG/2ML IJ SOLN
INTRAMUSCULAR | Status: AC
  Filled 2016-03-12: qty 4

## 2016-03-12 MED ORDER — PROPOFOL 10 MG/ML IV BOLUS
INTRAVENOUS | Status: DC | PRN
  Administered 2016-03-12: 140 mg via INTRAVENOUS

## 2016-03-12 MED ORDER — DEXAMETHASONE SODIUM PHOSPHATE 10 MG/ML IJ SOLN
10.0000 mg | Freq: Once | INTRAMUSCULAR | Status: DC
  Filled 2016-03-12: qty 1

## 2016-03-12 MED ORDER — ACETAMINOPHEN 325 MG PO TABS: 650.0000 mg | ORAL_TABLET | Freq: Four times a day (QID) | ORAL | Status: DC | PRN

## 2016-03-12 MED ORDER — METHOCARBAMOL 1000 MG/10ML IJ SOLN
500.0000 mg | Freq: Four times a day (QID) | INTRAVENOUS | Status: DC | PRN
  Filled 2016-03-12: qty 5

## 2016-03-12 MED ORDER — LACTATED RINGERS IV SOLN
INTRAVENOUS | Status: DC | PRN
  Administered 2016-03-12 (×2): via INTRAVENOUS

## 2016-03-12 MED ORDER — ONDANSETRON HCL 4 MG/2ML IJ SOLN
INTRAMUSCULAR | Status: DC | PRN
  Administered 2016-03-12: 4 mg via INTRAVENOUS

## 2016-03-12 MED ORDER — ROCURONIUM BROMIDE 10 MG/ML (PF) SYRINGE
PREFILLED_SYRINGE | INTRAVENOUS | Status: DC | PRN
  Administered 2016-03-12: 50 mg via INTRAVENOUS

## 2016-03-12 MED ORDER — ASPIRIN EC 325 MG PO TBEC
325.0000 mg | DELAYED_RELEASE_TABLET | Freq: Every day | ORAL | Status: DC
  Administered 2016-03-13 – 2016-03-14 (×2): 325 mg via ORAL
  Filled 2016-03-12 (×2): qty 1

## 2016-03-12 MED ORDER — PROPOFOL 10 MG/ML IV BOLUS
INTRAVENOUS | Status: AC
  Filled 2016-03-12: qty 20

## 2016-03-12 MED ORDER — OXYCODONE-ACETAMINOPHEN 5-325 MG PO TABS
1.0000 | ORAL_TABLET | ORAL | Status: DC | PRN
  Administered 2016-03-12 – 2016-03-14 (×9): 2 via ORAL
  Filled 2016-03-12 (×8): qty 2

## 2016-03-12 MED ORDER — ONDANSETRON HCL 4 MG/2ML IJ SOLN: 4.0000 mg | Freq: Four times a day (QID) | INTRAMUSCULAR | Status: DC | PRN

## 2016-03-12 MED ORDER — FENTANYL CITRATE (PF) 100 MCG/2ML IJ SOLN
INTRAMUSCULAR | Status: AC
  Filled 2016-03-12: qty 2

## 2016-03-12 MED ORDER — DEXAMETHASONE SODIUM PHOSPHATE 10 MG/ML IJ SOLN
INTRAMUSCULAR | Status: AC
  Filled 2016-03-12: qty 1

## 2016-03-12 MED ORDER — GABAPENTIN 300 MG PO CAPS
ORAL_CAPSULE | ORAL | Status: AC
Start: 2016-03-12 — End: 2016-03-12
  Administered 2016-03-12: 300 mg
  Filled 2016-03-12: qty 1

## 2016-03-12 MED ORDER — ROCURONIUM BROMIDE 10 MG/ML (PF) SYRINGE
PREFILLED_SYRINGE | INTRAVENOUS | Status: AC
  Filled 2016-03-12: qty 10

## 2016-03-12 MED ORDER — MIDAZOLAM HCL 5 MG/5ML IJ SOLN
INTRAMUSCULAR | Status: DC | PRN
  Administered 2016-03-12: 2 mg via INTRAVENOUS

## 2016-03-12 MED ORDER — CHLORHEXIDINE GLUCONATE 4 % EX LIQD: 60.0000 mL | Freq: Once | CUTANEOUS | Status: DC

## 2016-03-12 MED ORDER — OXYCODONE-ACETAMINOPHEN 5-325 MG PO TABS
ORAL_TABLET | ORAL | Status: AC
  Filled 2016-03-12: qty 2

## 2016-03-12 MED ORDER — METOCLOPRAMIDE HCL 5 MG/ML IJ SOLN: 5.0000 mg | Freq: Three times a day (TID) | INTRAMUSCULAR | Status: DC | PRN

## 2016-03-12 MED ORDER — ONDANSETRON HCL 4 MG/2ML IJ SOLN
INTRAMUSCULAR | Status: AC
  Filled 2016-03-12: qty 2

## 2016-03-12 MED ORDER — CEFAZOLIN SODIUM-DEXTROSE 2-4 GM/100ML-% IV SOLN
2.0000 g | Freq: Four times a day (QID) | INTRAVENOUS | Status: AC
  Administered 2016-03-12 (×2): 2 g via INTRAVENOUS
  Filled 2016-03-12 (×2): qty 100

## 2016-03-12 MED ORDER — BISACODYL 10 MG RE SUPP: 10.0000 mg | Freq: Every day | RECTAL | Status: DC | PRN

## 2016-03-12 MED ORDER — SUGAMMADEX SODIUM 200 MG/2ML IV SOLN
INTRAVENOUS | Status: AC
  Filled 2016-03-12: qty 2

## 2016-03-12 MED ORDER — METHOCARBAMOL 500 MG PO TABS
ORAL_TABLET | ORAL | Status: AC
  Filled 2016-03-12: qty 1

## 2016-03-12 MED ORDER — DIPHENHYDRAMINE HCL 12.5 MG/5ML PO ELIX: 12.5000 mg | ORAL_SOLUTION | ORAL | Status: DC | PRN

## 2016-03-12 MED ORDER — PHENOL 1.4 % MT LIQD: 1.0000 | OROMUCOSAL | Status: DC | PRN

## 2016-03-12 MED ORDER — HYDROMORPHONE HCL 1 MG/ML IJ SOLN: 0.5000 mg | INTRAMUSCULAR | Status: DC | PRN

## 2016-03-12 MED ORDER — ACETAMINOPHEN 500 MG PO TABS
ORAL_TABLET | ORAL | Status: AC
  Administered 2016-03-12: 1000 mg
  Filled 2016-03-12: qty 2

## 2016-03-12 MED ORDER — PHENYLEPHRINE HCL 10 MG/ML IJ SOLN
INTRAMUSCULAR | Status: DC | PRN
  Administered 2016-03-12: 80 ug via INTRAVENOUS
  Administered 2016-03-12: 40 ug via INTRAVENOUS

## 2016-03-12 MED ORDER — LACTATED RINGERS IV SOLN
INTRAVENOUS | Status: DC
  Administered 2016-03-12: 09:00:00 via INTRAVENOUS

## 2016-03-12 MED ORDER — LIDOCAINE HCL 4 % EX SOLN
CUTANEOUS | Status: DC | PRN
  Administered 2016-03-12: 4 mL via TOPICAL

## 2016-03-12 MED ORDER — BUPIVACAINE HCL 0.5 % IJ SOLN
INTRAMUSCULAR | Status: DC | PRN
  Administered 2016-03-12: 10 mL

## 2016-03-12 MED ORDER — ZOLPIDEM TARTRATE 5 MG PO TABS
5.0000 mg | ORAL_TABLET | Freq: Every evening | ORAL | Status: DC | PRN
  Administered 2016-03-12 – 2016-03-13 (×2): 5 mg via ORAL
  Filled 2016-03-12 (×2): qty 1

## 2016-03-12 MED ORDER — MAGNESIUM CITRATE PO SOLN: 1.0000 | Freq: Once | ORAL | Status: DC | PRN

## 2016-03-12 MED ORDER — ACETAMINOPHEN 650 MG RE SUPP: 650.0000 mg | Freq: Four times a day (QID) | RECTAL | Status: DC | PRN

## 2016-03-12 MED ORDER — LIDOCAINE 2% (20 MG/ML) 5 ML SYRINGE
INTRAMUSCULAR | Status: AC
  Filled 2016-03-12: qty 5

## 2016-03-12 MED ORDER — DEXAMETHASONE SODIUM PHOSPHATE 10 MG/ML IJ SOLN
INTRAMUSCULAR | Status: DC | PRN
  Administered 2016-03-12: 10 mg via INTRAVENOUS

## 2016-03-12 MED ORDER — HYDROMORPHONE HCL 2 MG/ML IJ SOLN
INTRAMUSCULAR | Status: AC
  Administered 2016-03-12: 0.5 mg via INTRAVENOUS
  Filled 2016-03-12: qty 1

## 2016-03-12 MED ORDER — PROPAFENONE HCL ER 225 MG PO CP12
225.0000 mg | ORAL_CAPSULE | Freq: Two times a day (BID) | ORAL | Status: DC
  Administered 2016-03-12 – 2016-03-14 (×4): 225 mg via ORAL
  Filled 2016-03-12 (×4): qty 1

## 2016-03-12 MED ORDER — BUPIVACAINE HCL (PF) 0.5 % IJ SOLN
INTRAMUSCULAR | Status: AC
  Filled 2016-03-12: qty 30

## 2016-03-12 MED ORDER — SODIUM CHLORIDE 0.9 % IR SOLN
Status: DC | PRN
  Administered 2016-03-12: 3000 mL

## 2016-03-12 MED ORDER — PHENYLEPHRINE 40 MCG/ML (10ML) SYRINGE FOR IV PUSH (FOR BLOOD PRESSURE SUPPORT)
PREFILLED_SYRINGE | INTRAVENOUS | Status: AC
  Filled 2016-03-12: qty 10

## 2016-03-12 MED ORDER — POLYETHYLENE GLYCOL 3350 17 G PO PACK
17.0000 g | PACK | Freq: Every day | ORAL | Status: DC | PRN
Start: 2016-03-12 — End: 2016-03-14

## 2016-03-12 MED ORDER — ONDANSETRON HCL 4 MG PO TABS: 4.0000 mg | ORAL_TABLET | Freq: Four times a day (QID) | ORAL | Status: DC | PRN

## 2016-03-12 MED ORDER — MIDAZOLAM HCL 2 MG/2ML IJ SOLN
INTRAMUSCULAR | Status: AC
  Filled 2016-03-12: qty 2

## 2016-03-12 MED ORDER — ROPIVACAINE HCL 7.5 MG/ML IJ SOLN
INTRAMUSCULAR | Status: DC | PRN
  Administered 2016-03-12: 20 mL via PERINEURAL

## 2016-03-12 MED ORDER — METHOCARBAMOL 500 MG PO TABS
500.0000 mg | ORAL_TABLET | Freq: Four times a day (QID) | ORAL | Status: DC | PRN
  Administered 2016-03-12 – 2016-03-14 (×6): 500 mg via ORAL
  Filled 2016-03-12 (×5): qty 1

## 2016-03-12 MED ORDER — ALPRAZOLAM 0.25 MG PO TABS
0.2500 mg | ORAL_TABLET | Freq: Every day | ORAL | Status: DC | PRN
  Administered 2016-03-13 (×2): 0.25 mg via ORAL
  Filled 2016-03-12 (×3): qty 1

## 2016-03-12 MED ORDER — MIDAZOLAM HCL 2 MG/2ML IJ SOLN
INTRAMUSCULAR | Status: AC
  Administered 2016-03-12: 2 mg
  Filled 2016-03-12: qty 2

## 2016-03-12 MED ORDER — HYDROMORPHONE HCL 2 MG/ML IJ SOLN
0.2500 mg | INTRAMUSCULAR | Status: DC | PRN
  Administered 2016-03-12 (×3): 0.5 mg via INTRAVENOUS

## 2016-03-12 MED ORDER — POTASSIUM CHLORIDE IN NACL 20-0.9 MEQ/L-% IV SOLN
INTRAVENOUS | Status: DC
  Administered 2016-03-13: 07:00:00 via INTRAVENOUS
  Filled 2016-03-12: qty 1000

## 2016-03-12 SURGICAL SUPPLY — 65 items
BANDAGE ACE 4X5 VEL STRL LF (GAUZE/BANDAGES/DRESSINGS) ×3 IMPLANT
BANDAGE ACE 6X5 VEL STRL LF (GAUZE/BANDAGES/DRESSINGS) IMPLANT
BANDAGE ESMARK 6X9 LF (GAUZE/BANDAGES/DRESSINGS) ×1 IMPLANT
BENZOIN TINCTURE PRP APPL 2/3 (GAUZE/BANDAGES/DRESSINGS) ×3 IMPLANT
BLADE SAG 18X100X1.27 (BLADE) ×6 IMPLANT
BLADE SURG 10 STRL SS (BLADE) ×3 IMPLANT
BNDG ELASTIC 6X10 VLCR STRL LF (GAUZE/BANDAGES/DRESSINGS) ×3 IMPLANT
BNDG ESMARK 6X9 LF (GAUZE/BANDAGES/DRESSINGS) ×3
BOWL SMART MIX CTS (DISPOSABLE) ×3 IMPLANT
CAPT KNEE TOTAL 3 ×3 IMPLANT
CEMENT BONE SIMPLEX SPEEDSET (Cement) ×6 IMPLANT
CLOSURE WOUND 1/2 X4 (GAUZE/BANDAGES/DRESSINGS) ×1
COVER SURGICAL LIGHT HANDLE (MISCELLANEOUS) ×3 IMPLANT
CUFF TOURNIQUET SINGLE 34IN LL (TOURNIQUET CUFF) ×3 IMPLANT
DRAPE EXTREMITY T 121X128X90 (DRAPE) ×3 IMPLANT
DRAPE IMP U-DRAPE 54X76 (DRAPES) ×3 IMPLANT
DRAPE PROXIMA HALF (DRAPES) IMPLANT
DRAPE U-SHAPE 47X51 STRL (DRAPES) ×3 IMPLANT
DRSG AQUACEL AG ADV 3.5X10 (GAUZE/BANDAGES/DRESSINGS) ×3 IMPLANT
DURAPREP 26ML APPLICATOR (WOUND CARE) ×3 IMPLANT
ELECT CAUTERY BLADE 6.4 (BLADE) ×3 IMPLANT
ELECT REM PT RETURN 9FT ADLT (ELECTROSURGICAL) ×3
ELECTRODE REM PT RTRN 9FT ADLT (ELECTROSURGICAL) ×1 IMPLANT
EVACUATOR 1/8 PVC DRAIN (DRAIN) IMPLANT
FACESHIELD WRAPAROUND (MASK) ×9 IMPLANT
GLOVE BIOGEL M 7.0 STRL (GLOVE) ×3 IMPLANT
GLOVE BIOGEL PI IND STRL 7.0 (GLOVE) ×3 IMPLANT
GLOVE BIOGEL PI IND STRL 7.5 (GLOVE) ×1 IMPLANT
GLOVE BIOGEL PI INDICATOR 7.0 (GLOVE) ×6
GLOVE BIOGEL PI INDICATOR 7.5 (GLOVE) ×2
GLOVE ORTHO TXT STRL SZ7.5 (GLOVE) ×6 IMPLANT
GLOVE SURG ORTHO 7.0 STRL STRW (GLOVE) ×6 IMPLANT
GLOVE SURG SS PI 6.5 STRL IVOR (GLOVE) ×9 IMPLANT
GOWN STRL REUS W/ TWL LRG LVL3 (GOWN DISPOSABLE) ×1 IMPLANT
GOWN STRL REUS W/ TWL XL LVL3 (GOWN DISPOSABLE) ×3 IMPLANT
GOWN STRL REUS W/TWL LRG LVL3 (GOWN DISPOSABLE) ×2
GOWN STRL REUS W/TWL XL LVL3 (GOWN DISPOSABLE) ×6
HANDPIECE INTERPULSE COAX TIP (DISPOSABLE) ×2
IMMOBILIZER KNEE 22 UNIV (SOFTGOODS) IMPLANT
IMMOBILIZER KNEE 24 THIGH 36 (MISCELLANEOUS) ×1 IMPLANT
IMMOBILIZER KNEE 24 UNIV (MISCELLANEOUS) ×3
KIT BASIN OR (CUSTOM PROCEDURE TRAY) ×3 IMPLANT
KIT ROOM TURNOVER OR (KITS) ×3 IMPLANT
MANIFOLD NEPTUNE II (INSTRUMENTS) ×3 IMPLANT
NEEDLE 18GX1X1/2 (RX/OR ONLY) (NEEDLE) ×3 IMPLANT
NEEDLE HYPO 25GX1X1/2 BEV (NEEDLE) ×3 IMPLANT
NS IRRIG 1000ML POUR BTL (IV SOLUTION) ×3 IMPLANT
PACK TOTAL JOINT (CUSTOM PROCEDURE TRAY) ×3 IMPLANT
PACK UNIVERSAL I (CUSTOM PROCEDURE TRAY) IMPLANT
PAD ARMBOARD 7.5X6 YLW CONV (MISCELLANEOUS) ×6 IMPLANT
SET HNDPC FAN SPRY TIP SCT (DISPOSABLE) ×1 IMPLANT
STRIP CLOSURE SKIN 1/2X4 (GAUZE/BANDAGES/DRESSINGS) ×2 IMPLANT
SUCTION FRAZIER HANDLE 10FR (MISCELLANEOUS)
SUCTION TUBE FRAZIER 10FR DISP (MISCELLANEOUS) IMPLANT
SUT MNCRL AB 4-0 PS2 18 (SUTURE) ×3 IMPLANT
SUT VIC AB 0 CT1 27 (SUTURE) ×4
SUT VIC AB 0 CT1 27XBRD ANBCTR (SUTURE) ×2 IMPLANT
SUT VIC AB 1 CTX 36 (SUTURE) ×4
SUT VIC AB 1 CTX36XBRD ANBCTR (SUTURE) ×2 IMPLANT
SUT VIC AB 2-0 CT1 27 (SUTURE) ×4
SUT VIC AB 2-0 CT1 TAPERPNT 27 (SUTURE) ×2 IMPLANT
SYR 50ML LL SCALE MARK (SYRINGE) ×3 IMPLANT
SYR CONTROL 10ML LL (SYRINGE) ×3 IMPLANT
TOWEL OR 17X24 6PK STRL BLUE (TOWEL DISPOSABLE) ×3 IMPLANT
TOWEL OR 17X26 10 PK STRL BLUE (TOWEL DISPOSABLE) ×3 IMPLANT

## 2016-03-12 NOTE — Anesthesia Procedure Notes (Signed)
Procedure Name: Intubation Date/Time: 03/12/2016 9:53 AM Performed by: Garrison Columbus T Pre-anesthesia Checklist: Patient identified, Emergency Drugs available, Suction available and Patient being monitored Patient Re-evaluated:Patient Re-evaluated prior to inductionOxygen Delivery Method: Circle System Utilized Preoxygenation: Pre-oxygenation with 100% oxygen Intubation Type: IV induction Ventilation: Mask ventilation without difficulty and Oral airway inserted - appropriate to patient size Laryngoscope Size: Sabra Heck and 2 Grade View: Grade II Tube type: Oral Tube size: 7.5 mm Number of attempts: 1 Airway Equipment and Method: Stylet and Oral airway Placement Confirmation: ETT inserted through vocal cords under direct vision,  positive ETCO2 and breath sounds checked- equal and bilateral Secured at: 22 cm Tube secured with: Tape Dental Injury: Teeth and Oropharynx as per pre-operative assessment

## 2016-03-12 NOTE — Interval H&P Note (Signed)
History and Physical Interval Note:  03/12/2016 8:35 AM  Pamela Underwood  has presented today for surgery, with the diagnosis of djd right knee  The various methods of treatment have been discussed with the patient and family. After consideration of risks, benefits and other options for treatment, the patient has consented to  Procedure(s): RIGHT TOTAL KNEE ARTHROPLASTY (Right) as a surgical intervention .  The patient's history has been reviewed, patient examined, no change in status, stable for surgery.  I have reviewed the patient's chart and labs.  Questions were answered to the patient's satisfaction.     [name]

## 2016-03-12 NOTE — Progress Notes (Signed)
Orthopedic Tech Progress Note Patient Details:  Pamela Underwood [birth year] [id number]  CPM Right Knee CPM Right Knee: On Right Knee Flexion (Degrees): 90 Right Knee Extension (Degrees): 0 Additional Comments: trapeze bar patient helper   Hildred Priest Viewed order from doctor's order list

## 2016-03-12 NOTE — Anesthesia Procedure Notes (Addendum)
Anesthesia Regional Block:  Adductor canal block  Pre-Anesthetic Checklist: ,, timeout performed, Correct Patient, Correct Site, Correct Laterality, Correct Procedure, Correct Position, site marked, Risks and benefits discussed, pre-op evaluation,  At surgeon's request and post-op pain management  Laterality: Right  Prep: Maximum Sterile Barrier Precautions used, chloraprep       Needles:  Injection technique: Single-shot  Needle Type: Echogenic Stimulator Needle     Needle Length: 9cm 9 cm Needle Gauge: 21 and 21 G    Additional Needles:  Procedures: ultrasound guided (picture in chart) Adductor canal block Narrative:  Start time: 03/12/2016 9:07 AM End time: 03/12/2016 9:17 AM Injection made incrementally with aspirations every 5 mL. Anesthesiologist: Roderic Palau  Additional Notes: 2% Lidocaine skin wheel.

## 2016-03-12 NOTE — Evaluation (Signed)
Physical Therapy Evaluation Patient Details Name: Pamela Underwood MRN: [id number] DOB: [birth year] Today's Date: 03/12/2016   History of Present Illness  Patient is a 60 y/o female with hx of left TKA, colitis, breast ca, back pain presents s/p right TKA.  Clinical Impression  Patient presents with pain and post surgical deficits s/p above surgery. Tolerated gait training and transfers with Min guard assist for safety. Education re: knee precautions, zero degree knee and exercises. Pt will have support from spouse and daughter at home. Will follow acutely to maximize independence and mobility prior to return home.     Follow Up Recommendations Home health PT;Supervision - Intermittent    Equipment Recommendations  None recommended by PT    Recommendations for Other Services OT consult     Precautions / Restrictions Precautions Precautions: Knee Precaution Booklet Issued: No Precaution Comments: Reviewed no pillow under knee and precautions Restrictions Weight Bearing Restrictions: Yes RLE Weight Bearing: Weight bearing as tolerated      Mobility  Bed Mobility Overal bed mobility: Needs Assistance Bed Mobility: Supine to Sit     Supine to sit: Supervision;HOB elevated     General bed mobility comments: Use of rails. No physical assist needed.  Transfers Overall transfer level: Needs assistance Equipment used: Rolling walker (2 wheeled) Transfers: Sit to/from Stand Sit to Stand: Min guard         General transfer comment: Min guard for safety. Stood from [employer], from chair x2, from [facility] x1.  Ambulation/Gait Ambulation/Gait assistance: Min guard Ambulation Distance (Feet): 50 Feet Assistive device: Rolling walker (2 wheeled) Gait Pattern/deviations: Step-through pattern;Decreased stride length;Decreased stance time - left;Decreased step length - right;Trunk flexed Gait velocity: decreased Gait velocity interpretation: Below normal speed for age/gender General  Gait Details: Slow, unsteady gait with mild right knee instability noted. Cues for RW proximity.  Stairs            Wheelchair Mobility    Modified Rankin (Stroke Patients Only)       Balance Overall balance assessment: Needs assistance Sitting-balance support: Feet supported;No upper extremity supported Sitting balance-Leahy Scale: Good     Standing balance support: During functional activity Standing balance-Leahy Scale: Fair Standing balance comment: Able to wash hands at sink without UE support.                             Pertinent Vitals/Pain Pain Assessment: 0-10 Pain Score: 3  Pain Location: right knee Pain Descriptors / Indicators: Sore;Operative site guarding Pain Intervention(s): Monitored during session;Repositioned;RN gave pain meds during session    [place] expects to be discharged to:: Private residence Living Arrangements:  (daughter will be staying with her this weekend) Available Help at Discharge: Family;Available 24 hours/day Type of Home: House Home Access: Stairs to enter Entrance Stairs-Rails: None Entrance Stairs-Number of Steps: 1 Home Layout: Two level;Able to live on main level with bedroom/bathroom Home Equipment: Gilford Rile - 2 wheels;Bedside commode;Hospital bed Additional Comments: Reports she rented a hospital bed.     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     Hand Dominance   Dominant Hand: Right    Extremity/Trunk Assessment   Upper Extremity Assessment: Defer to OT evaluation           Lower Extremity Assessment: RLE deficits/detail RLE Deficits / Details: Limited AROM/strength secondary to pain and surgery.  Communication   Communication: No difficulties  Cognition Arousal/Alertness: Awake/alert Behavior During Therapy: WFL for tasks assessed/performed Overall Cognitive Status: Within Functional Limits for  tasks assessed                      General Comments      Exercises Total Joint Exercises Ankle Circles/Pumps: Both;10 reps;Supine Quad Sets: Both;10 reps;Supine Gluteal Sets: Both;10 reps;Supine   Assessment/Plan    PT Assessment Patient needs continued PT services  PT Problem List Decreased strength;Decreased mobility;Decreased range of motion;Decreased balance;Pain;Impaired sensation;Decreased knowledge of use of DME          PT Treatment Interventions DME instruction;Therapeutic activities;Gait training;Therapeutic exercise;Patient/family education;Balance training;Functional mobility training;Stair training    PT Goals (Current goals can be found in the Care Plan section)  Acute Rehab PT Goals Patient Stated Goal: to go on a vacation before work PT Goal Formulation: With patient Time For Goal Achievement: 03/26/16 Potential to Achieve Goals: Good    Frequency 7X/week   Barriers to discharge        Co-evaluation               End of Session Equipment Utilized During Treatment: Gait belt Activity Tolerance: Patient tolerated treatment well Patient left: in chair;with call bell/phone within reach;with family/visitor present;with nursing/sitter in room Nurse Communication: Mobility status         Time: [id number] PT Time Calculation (min) (ACUTE ONLY): 27 min   Charges:   PT Evaluation $PT Eval Low Complexity: 1 Procedure PT Treatments $Gait Training: 8-22 mins   PT G Codes:        B[facility]ed [place] A Khalif Stender 03/12/2016, 4:21 PM Wray Kearns, [place], DPT [phone]

## 2016-03-12 NOTE — Transfer of Care (Signed)
Immediate Anesthesia Transfer of Care Note  Patient: Pamela Underwood  Procedure(s) Performed: Procedure(s): RIGHT TOTAL KNEE ARTHROPLASTY (Right)  Patient Location: PACU  Anesthesia Type:General and Regional  Level of Consciousness: awake, alert  and oriented  Airway & Oxygen Therapy: Patient Spontanous Breathing and Patient connected to nasal cannula oxygen  Post-op Assessment: Report given to RN, Post -op Vital signs reviewed and stable and Patient moving all extremities X 4  Post vital signs: Reviewed and stable  Last Vitals:  Vitals:   03/12/16 0930 03/12/16 0935  BP: (!) 141/85 121/70  Pulse: 77 (!) 36  Resp: 15 17  Temp:      Last Pain:  Vitals:   03/12/16 0826  TempSrc:   PainSc: 3       Patients Stated Pain Goal: 4 [id number])  Complications: No apparent anesthesia complications

## 2016-03-13 ENCOUNTER — Other Ambulatory Visit: Payer: 59

## 2016-03-13 ENCOUNTER — Encounter (HOSPITAL_COMMUNITY): Payer: Self-pay | Admitting: Orthopedic Surgery

## 2016-03-13 ENCOUNTER — Ambulatory Visit: Payer: 59 | Admitting: Oncology

## 2016-03-13 LAB — BASIC METABOLIC PANEL
Anion gap: 11 (ref 5–15)
BUN: 13 mg/dL (ref 6–20)
CHLORIDE: 107 mmol/L (ref 101–111)
CO2: 20 mmol/L — ABNORMAL LOW (ref 22–32)
Calcium: 8 mg/dL — ABNORMAL LOW (ref 8.9–10.3)
Creatinine, Ser: 0.77 mg/dL (ref 0.44–1.00)
GFR calc Af Amer: >60 mL/min (ref >60)
GFR calc non Af Amer: >60 mL/min (ref >60)
GLUCOSE: 161 mg/dL — AB (ref 65–99)
POTASSIUM: 4.8 mmol/L (ref 3.5–5.1)
Sodium: 138 mmol/L (ref 135–145)

## 2016-03-13 LAB — CBC
HCT: 31.8 % — ABNORMAL LOW (ref 36.0–46.0)
HEMOGLOBIN: 11.2 g/dL — AB (ref 12.0–15.0)
MCH: 32.7 pg (ref 26.0–34.0)
MCHC: 35.2 g/dL (ref 30.0–36.0)
MCV: 92.7 fL (ref 78.0–100.0)
Platelets: 142 10*3/uL — ABNORMAL LOW (ref 150–400)
RBC: 3.43 MIL/uL — AB (ref 3.87–5.11)
RDW: 11.9 % (ref 11.5–15.5)
WBC: 11.7 10*3/uL — ABNORMAL HIGH (ref 4.0–10.5)

## 2016-03-13 MED ORDER — CELECOXIB 200 MG PO CAPS: 200.0000 mg | ORAL_CAPSULE | Freq: Two times a day (BID) | ORAL | 1 refills | Status: DC

## 2016-03-13 MED ORDER — ASPIRIN 325 MG PO TBEC: 325.0000 mg | DELAYED_RELEASE_TABLET | Freq: Every day | ORAL | 0 refills | Status: DC

## 2016-03-13 MED ORDER — OXYCODONE-ACETAMINOPHEN 5-325 MG PO TABS: 1.0000 | ORAL_TABLET | ORAL | 0 refills | Status: DC | PRN

## 2016-03-13 MED ORDER — METHOCARBAMOL 500 MG PO TABS: 500.0000 mg | ORAL_TABLET | Freq: Four times a day (QID) | ORAL | 0 refills | Status: DC | PRN

## 2016-03-13 MED ORDER — ONDANSETRON HCL 4 MG PO TABS: 4.0000 mg | ORAL_TABLET | Freq: Four times a day (QID) | ORAL | 0 refills | Status: DC | PRN

## 2016-03-13 NOTE — Op Note (Signed)
NAME:  Pamela Underwood, Pamela Underwood NO.:  [id number]  MEDICAL RECORD NO.:  [id number]  LOCATION:  [id number]                        FACILITY:  [facility]  PHYSICIAN:  [name], M.D. DATE OF BIRTH:  1956/03/13  DATE OF PROCEDURE:  03/12/2016 DATE OF DISCHARGE:                              OPERATIVE REPORT   PREOPERATIVE DIAGNOSIS:  Right knee end-stage arthritis, primary generalized.  POSTOPERATIVE DIAGNOSIS:  Right knee end-stage arthritis, primary generalized.  PROCEDURE:  Modified minimally invasive right total knee replacement with Stryker Triathlon prosthesis.  A cemented cruciate-retaining #3 femoral component.  Cemented #4 tibial component, 9 mm CS insert. Cemented resurfacing 32 mm patellar component.  SURGEON:  [name], M.D.  ASSISTANT:  Lowell [employer]. Mercie Eon.  ANESTHESIA:  General.  BLOOD LOSS:  Minimal.  SPECIMENS:  None.  CULTURES:  None.  COMPLICATIONS:  None.  DRESSING:  Soft compressive.  Knee immobilizer.  TOURNIQUET TIME:  50 minutes.  DESCRIPTION OF PROCEDURE:  The patient was brought to the operating room and placed on the operating table in a supine position.  After adequate anesthesia had been obtained, tourniquet applied, prepped and draped in usual sterile fashion.  Exsanguinated with elevation of Esmarch, tourniquet inflated to 350 mmHg.  A straight incision above the patella down to tibial tubercle.  Medial arthrotomy, vastus splitting preserving quad tendon.  Intramedullary guide, distal femur.  8 mm resection, 5 degrees of valgus.  Using epicondylar axis, the femur was sized, cut, and fitted for a cruciate-retaining #3 femoral component.  Proximal tibial resection with extramedullary guide.  Size #4 component rotation set with trials.  A 9 mm CS insert.  Resurfacing patella with a 32 mm component.  The patella was very worn and thin, but there was adequate bone stock for the component.  With trials in place, rotation marked  on the tibia, which was then hand reamed.  All trials removed.  Copious irrigation with a pulse irrigating device.  Cement prepared, placed on all components, firmly seated.  Polyethylene attached to tibia, knee reduced.  Patella held with a clamp.  Once the cement had hardened, the knee was irrigated again.  Soft tissue was injected with Exparel. Arthrotomy closed with #1 Vicryl and subcutaneous with subcuticular closure.  Margins were injected with Marcaine.  Sterile compressive dressing applied. Tourniquet deflated, removed.  Knee immobilizer applied.  Anesthesia reversed.  Brought to the recovery room.  Tolerated the surgery well. No complications.     [name], M.D.     DFM/MEDQ  D:  03/12/2016  T:  03/13/2016  Job:  [id number]

## 2016-03-13 NOTE — Progress Notes (Signed)
Orthopedic Tech Progress Note Patient Details:  Pamela Underwood 11/28/1955 [id number]  Patient ID: Pamela Underwood, female   DOB: 1955/08/03, 60 y.o.   MRN: [id number] Applied cpm 0-90  Pamela Underwood 03/13/2016, 6:13 AM

## 2016-03-13 NOTE — Progress Notes (Signed)
Patient can place self on CPAP when ready. RT will continue to monitor and assist if needed.

## 2016-03-13 NOTE — Progress Notes (Signed)
Physical Therapy Treatment Patient Details Name: Pamela Underwood MRN: [id number] DOB: 27-Mar-1956 Today's Date: 03/13/2016    History of Present Illness Patient is a 60 y/o female with hx of left TKA, colitis, breast ca, back pain presents s/p right TKA.    PT Comments    Pt making gradual progress with PT, will continue to follow to advance mobility, safety and independence.   Follow Up Recommendations  Home health PT;Supervision - Intermittent     Equipment Recommendations  None recommended by PT    Recommendations for Other Services       Precautions / Restrictions Precautions Precautions: Knee;Fall Precaution Booklet Issued: Yes (comment) Precaution Comments: HEP provided Restrictions Weight Bearing Restrictions: Yes RLE Weight Bearing: Weight bearing as tolerated    Mobility  Bed Mobility Overal bed mobility: Needs Assistance Bed Mobility: Sit to Supine       Sit to supine: Min guard   General bed mobility comments: Out of bed in chair on OT arrival.  Transfers Overall transfer level: Needs assistance Equipment used: Rolling walker (2 wheeled) Transfers: Sit to/from Stand Sit to Stand: Supervision            Ambulation/Gait Ambulation/Gait assistance: Min guard Ambulation Distance (Feet): 60 Feet Assistive device: Rolling walker (2 wheeled) Gait Pattern/deviations: Step-through pattern;Decreased weight shift to right Gait velocity: decreased   General Gait Details: working on Network engineer Rankin (Stroke Patients Only)       Balance Overall balance assessment: Needs assistance Sitting-balance support: No upper extremity supported Sitting balance-Leahy Scale: Good     Standing balance support: During functional activity Standing balance-Leahy Scale: Fair Standing balance comment: static standing                    Cognition Arousal/Alertness:  Awake/alert Behavior During Therapy: WFL for tasks assessed/performed Overall Cognitive Status: Within Functional Limits for tasks assessed                      Exercises Total Joint Exercises Ankle Circles/Pumps: Both;10 reps;Supine Quad Sets: Both;10 reps;Supine Heel Slides: AAROM;Right;10 reps Straight Leg Raises: Strengthening;Right;10 reps Goniometric ROM: 90 degrees knee flexion    General Comments        Pertinent Vitals/Pain Pain Assessment: 0-10 Pain Score: 6  Faces Pain Scale: Hurts little more Pain Location: Rt knee Pain Descriptors / Indicators: Aching Pain Intervention(s): Limited activity within patient's tolerance;Monitored during session    Home Living Family/patient expects to be discharged to:: Private residence Living Arrangements: Spouse/significant other;Other relatives Available Help at Discharge: Family;Available 24 hours/day Type of Home: House Home Access: Stairs to enter Entrance Stairs-Rails: None Home Layout: Two level;Able to live on main level with bedroom/bathroom Home Equipment: Gilford Rile - 2 wheels;Hospital bed;Bedside commode;Grab bars - tub/shower;Hand held shower head;Tub bench      Prior Function Level of Independence: Independent      Comments: Works as an Therapist, 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   PT Goals (current goals can now be found in the care plan section) Acute Rehab PT Goals Patient Stated Goal: to go home PT Goal Formulation: With patient Time For Goal Achievement: 03/26/16 Potential to Achieve Goals: Good Progress towards PT goals: Progressing toward goals    Frequency    7X/week      PT Plan Current plan remains appropriate    Co-evaluation  End of Session Equipment Utilized During Treatment: Gait belt Activity Tolerance: Patient tolerated treatment well Patient left: in bed;with call bell/phone within reach (left with PA-C)     Time: [id number] PT Time Calculation (min) (ACUTE ONLY): 25  min  Charges:  $Gait Training: 8-22 mins $Therapeutic Exercise: 8-22 mins                    G Codes:      Cassell Clement, PT, CSCS Pager [phone] Office [phone]  03/13/2016, 10:18 AM

## 2016-03-13 NOTE — Progress Notes (Signed)
Orthopedic Tech Progress Note Patient Details:  Pamela Underwood [birth year] [id number]  CPM Right Knee CPM Right Knee: On Right Knee Flexion (Degrees): 90 Right Knee Extension (Degrees): 0 Additional Comments:  (on CPM at 0530 off at [place])   [place] Pink 03/13/2016, 6:20 PM

## 2016-03-13 NOTE — Progress Notes (Signed)
Subjective:  Patient reports pain as well controlled Out of bed with PT making progress to goals Voiding without difficulty No nausea  Objective:   VITALS:   Vitals:   03/12/16 1500 03/12/16 2013 03/12/16 2100 03/13/16 0300  BP: (!) 141/76 (!) 151/63  129/63  Pulse: (!) 108 96 (!) 101 83  Resp: '16 16 16 16  '$ Temp: 98.7 F (37.1 C) 97.6 F (36.4 C)  98.8 F (37.1 C)  TempSrc: Oral Oral  Oral  SpO2: 95% 95% 96% 99%  Weight:      Height:        Physical Exam A&o x3  Dressing: C/D/I  Compartments soft  SILT M/R/U, 2+rad puls, +EPL/FPL/IO Calf soft non tender  SILT DP/SP/S/S/T, 2+DP, +TA/GS/EHL  LABS  Results for orders placed or performed during the hospital encounter of 03/12/16 (from the past 24 hour(s))  CBC     Status: Abnormal   Collection Time: 03/13/16  3:50 AM  Result Value Ref Range   WBC 11.7 (H) 4.0 - 10.5 K/uL   RBC 3.43 (L) 3.87 - 5.11 MIL/uL   Hemoglobin 11.2 (L) 12.0 - 15.0 g/dL   HCT 31.8 (L) 36.0 - 46.0 %   MCV 92.7 78.0 - 100.0 fL   MCH 32.7 26.0 - 34.0 pg   MCHC 35.2 30.0 - 36.0 g/dL   RDW 11.9 11.5 - 15.5 %   Platelets 142 (L) 150 - 400 K/uL  Basic metabolic panel     Status: Abnormal   Collection Time: 03/13/16  3:50 AM  Result Value Ref Range   Sodium 138 135 - 145 mmol/L   Potassium 4.8 3.5 - 5.1 mmol/L   Chloride 107 101 - 111 mmol/L   CO2 20 (L) 22 - 32 mmol/L   Glucose, Bld 161 (H) 65 - 99 mg/dL   BUN 13 6 - 20 mg/dL   Creatinine, Ser 0.77 0.44 - 1.00 mg/dL   Calcium 8.0 (L) 8.9 - 10.3 mg/dL   GFR calc non Af Amer >60 >60 mL/min   GFR calc Af Amer >60 >60 mL/min   Anion gap 11 5 - 15     Assessment/Plan: 1 Day Post-Op   Active Problems:   S/P total knee arthroplasty   PLAN: Weight Bearing: as tolerated Dressings: leave aquacell in place VTE prophylaxis: ASA Dispo: hopeful d/c in AM to home if PT goals met   BLAIR ROBERTS 03/13/2016, 11:05 AM   Cell [phone]

## 2016-03-13 NOTE — Anesthesia Postprocedure Evaluation (Signed)
Anesthesia Post Note  Patient: Pamela Underwood  Procedure(s) Performed: Procedure(s) (LRB): RIGHT TOTAL KNEE ARTHROPLASTY (Right)  Patient location during evaluation: PACU Anesthesia Type: General and Regional Level of consciousness: awake and alert Pain management: pain level controlled Vital Signs Assessment: post-procedure vital signs reviewed and stable Respiratory status: spontaneous breathing, nonlabored ventilation, respiratory function stable and patient connected to nasal cannula oxygen Cardiovascular status: blood pressure returned to baseline and stable Postop Assessment: no signs of nausea or vomiting Anesthetic complications: no    Last Vitals:  Vitals:   03/12/16 2100 03/13/16 0300  BP:  129/63  Pulse: (!) 101 83  Resp: 16 16  Temp:  37.1 C    Last Pain:  Vitals:   03/13/16 0532  TempSrc:   PainSc: 5                  Tiajuana Amass

## 2016-03-13 NOTE — Discharge Instructions (Signed)

## 2016-03-13 NOTE — Evaluation (Signed)
Occupational Therapy Evaluation/Discharge Patient Details Name: Pamela Underwood MRN: [id number] DOB: 05/26/1956 Today's Date: 03/13/2016    History of Present Illness Patient is a 60 y/o female with hx of left TKA, colitis, breast ca, back pain presents s/p right TKA.   Clinical Impression   PTA, pt was independent with all basic ADL and IADL and was working as an Haematologist. Pt currently requires supervision for functional mobility and LB ADL. Education completed concerning knee precautions during ADL, use of ice for pain management, dressing techniques, and safe use of DME. Pt has all DME needs met in newly renovated bathroom. Pt plans to D/C home with 24 hour assist/supervision from husband and adult children. Pt has no further acute OT needs. OT signing off.    Follow Up Recommendations  No OT follow up;Supervision/Assistance - 24 hour    Equipment Recommendations  None recommended by OT       Precautions / Restrictions Precautions Precautions: Knee Precaution Booklet Issued: No Precaution Comments: Reviewed knee precautions during ADL. Restrictions Weight Bearing Restrictions: Yes RLE Weight Bearing: Weight bearing as tolerated      Mobility Bed Mobility               General bed mobility comments: Out of bed in chair on OT arrival.  Transfers Overall transfer level: Needs assistance Equipment used: Rolling walker (2 wheeled) Transfers: Sit to/from Stand Sit to Stand: Supervision              Balance Overall balance assessment: Needs assistance Sitting-balance support: Feet supported;No upper extremity supported Sitting balance-Leahy Scale: Good     Standing balance support: No upper extremity supported;During functional activity Standing balance-Leahy Scale: Fair Standing balance comment: Able to apply make-up at sink without UE support.                            ADL Overall ADL's : Needs assistance/impaired     Grooming:  Supervision/safety;Standing Grooming Details (indicate cue type and reason): Applied make-up at sink with supervision. Upper Body Bathing: Set up;Sitting   Lower Body Bathing: Supervison/ safety;Sit to/from stand   Upper Body Dressing : Set up;Sitting   Lower Body Dressing: Supervision/safety;Sit to/from stand   Toilet Transfer: Microbiologist Details (indicate cue type and reason): Simulated with recliner Toileting- Clothing Manipulation and Hygiene: Supervision/safety;Sit to/from stand   Tub/ Shower Transfer: Min guard;Tub bench;Rolling walker   Functional mobility during ADLs: Supervision/safety;Rolling walker General ADL Comments: Pt educated on dressing techniques, knee precautions during ADL, and safe tub transfer with tub bench. Pt reports no concerns or questions for OT.               Pertinent Vitals/Pain Pain Assessment: Faces Faces Pain Scale: Hurts little more Pain Location: R knee Pain Descriptors / Indicators: Operative site guarding;Sore Pain Intervention(s): Monitored during session;Repositioned;RN gave pain meds during session;Ice applied     Hand Dominance Right   Extremity/Trunk Assessment Upper Extremity Assessment Upper Extremity Assessment: Overall WFL for tasks assessed   Lower Extremity Assessment Lower Extremity Assessment: RLE deficits/detail RLE Deficits / Details: Decreased ROM and strength as expected post-operatively.       Communication Communication Communication: No difficulties   Cognition Arousal/Alertness: Awake/alert Behavior During Therapy: WFL for tasks assessed/performed Overall Cognitive Status: Within Functional Limits for tasks assessed  Home Living Family/patient expects to be discharged to:: Private residence Living Arrangements: Spouse/significant other;Other relatives Available Help at Discharge: Family;Available 24 hours/day Type of Home:  House Home Access: Stairs to enter [employer] of Steps: 1 Entrance Stairs-Rails: None Home Layout: Two level;Able to live on main level with bedroom/bathroom     Bathroom Shower/Tub: Occupational psychologist: Standard     Home Equipment: Environmental consultant - 2 wheels;Hospital bed;Bedside commode;Grab bars - tub/shower;Hand held shower head;Tub bench          Prior Functioning/Environment Level of Independence: Independent        Comments: Works as an Therapist, 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        OT Problem List: Decreased strength;Decreased range of motion;Decreased activity tolerance;Impaired balance (sitting and/or standing);Pain   OT Treatment/Interventions:      OT Goals(Current goals can be found in the care plan section) Acute Rehab OT Goals Patient Stated Goal: to go home OT Goal Formulation: With patient Time For Goal Achievement: 03/27/16 Potential to Achieve Goals: Good  OT Frequency:      End of Session Equipment Utilized During Treatment: Gait belt;Rolling walker CPM Right Knee CPM Right Knee: Off Right Knee Flexion (Degrees): 90 Right Knee Extension (Degrees): 0  Activity Tolerance: Patient tolerated treatment well Patient left: in chair;with call bell/phone within reach;with nursing/sitter in room   Time: 5701-7793 OT Time Calculation (min): 18 min Charges:  OT General Charges $OT Visit: 1 Procedure OT Evaluation $OT Eval Moderate Complexity: 1 Procedure  [place], OTR/L [phone] 03/13/2016, 9:00 AM

## 2016-03-13 NOTE — Progress Notes (Signed)
Physical Therapy Treatment Patient Details Name: Pamela Underwood MRN: [id number] DOB: August 06, 1955 Today's Date: 03/13/2016    History of Present Illness Patient is a 60 y/o female with hx of left TKA, colitis, breast ca, back pain presents s/p right TKA.    PT Comments    Pt making steady gains with PT, cues provided during ambulation for improving pattern. PT to continue to follow in anticipation of D/C to home following acute stay.   Follow Up Recommendations  Home health PT;Supervision - Intermittent     Equipment Recommendations  None recommended by PT    Recommendations for Other Services       Precautions / Restrictions Precautions Precautions: Knee;Fall Restrictions Weight Bearing Restrictions: Yes RLE Weight Bearing: Weight bearing as tolerated    Mobility  Bed Mobility Overal bed mobility: Needs Assistance Bed Mobility: Supine to Sit;Sit to Supine     Supine to sit: Supervision Sit to supine: Supervision      Transfers Overall transfer level: Needs assistance Equipment used: Rolling walker (2 wheeled) Transfers: Sit to/from Stand Sit to Stand: Supervision            Ambulation/Gait Ambulation/Gait assistance: Min guard Ambulation Distance (Feet): 150 Feet Assistive device: Rolling walker (2 wheeled) Gait Pattern/deviations: Step-through pattern     General Gait Details: working on heel-toe pattern and even strides. Intermittent cues for staying close to rw and decreasing use of UEs.    Stairs            Wheelchair Mobility    Modified Rankin (Stroke Patients Only)       Balance Overall balance assessment: Needs assistance Sitting-balance support: No upper extremity supported Sitting balance-Leahy Scale: Good     Standing balance support: During functional activity Standing balance-Leahy Scale: Fair                      Cognition Arousal/Alertness: Awake/alert Behavior During Therapy: WFL for tasks  assessed/performed Overall Cognitive Status: Within Functional Limits for tasks assessed                      Exercises Total Joint Exercises Long Arc Quad: Strengthening;Right;10 reps Knee Flexion: AROM;Right;10 reps;Seated    General Comments        Pertinent Vitals/Pain Pain Assessment: Faces Faces Pain Scale: Hurts little more Pain Location: Rt knee Pain Descriptors / Indicators: Aching Pain Intervention(s): Limited activity within patient's tolerance;Monitored during session;Ice applied    Home Living                      Prior Function            PT Goals (current goals can now be found in the care plan section) Acute Rehab PT Goals Patient Stated Goal: to go home PT Goal Formulation: With patient Time For Goal Achievement: 03/26/16 Potential to Achieve Goals: Good Progress towards PT goals: Progressing toward goals    Frequency    7X/week      PT Plan Current plan remains appropriate    Co-evaluation             End of Session Equipment Utilized During Treatment: Gait belt Activity Tolerance: Patient tolerated treatment well Patient left: in bed;in CPM;with call bell/phone within reach;with SCD's reapplied     Time: 1416-1436 PT Time Calculation (min) (ACUTE ONLY): 20 min  Charges:  $Gait Training: 8-22 mins  G Codes:      Cassell Clement, PT, CSCS Pager [phone] Office [phone]  03/13/2016, 2:40 PM

## 2016-03-13 NOTE — Care Management Note (Signed)
Case Management Note  Patient Details  Name: KATHALENE KERSEY MRN: [id number] Date of Birth: [birth year]  Subjective/Objective:   Pt presented for total knee arthroplasty. Pt is from home. Plan will be to return home 03-14-16. Pt's DME set up preoperatively by [facility] for Hospital Bed. [employer] preoperatively delivered the CPM and Bedside Commode.               Action/Plan: Agency List provided to patient for [place] and she chose [facility]. Referral was made to [facility] and [facility] to begin within 24-48 hours post d/c. No further needs from CM at this time.   Expected Discharge Date:                  Expected Discharge Plan:  [place]  In-House Referral:  NA  Discharge planning Services  CM Consult  Post Acute Care Choice:  Durable Medical Equipment, Home Health Choice offered to:  Patient  DME Arranged:  Hospital bed DME Agency:  [place]:  PT [place]:  [place]  Status of Service:  Completed, signed off  If discussed at [place] of Stay Meetings, dates discussed:    Additional Comments:  Bethena Roys, RN 03/13/2016, 3:39 PM

## 2016-03-13 NOTE — Discharge Summary (Signed)
Patient ID: Pamela Underwood MRN: [id number] DOB/AGE: 1955-09-28 60 y.o.  Admit date: 03/12/2016 Discharge date: 03/13/2016  Admission Diagnoses:  Active Problems:   S/P total knee arthroplasty   Discharge Diagnoses:  Same  Past Medical History:  Diagnosis Date  . Arthritis   . Atrial tachycardia ([place])    takes rythmol daily and cardiazem daily  . Back pain    occasionally  . Breast cancer ([place]) 02/22/14   right upper inner  . Chronic anxiety    takes Xanax daily as needed when heart rate going up  . Colitis    hx-one  . Dysrhythmia    atrial tachycardia- takes meds and controls it  . Elevated BP 06/06/2015  . Insomnia    takes Ambien nightly as needed  . Joint pain   . Joint swelling   . S/P radiation therapy 03/28/2014 through 04/27/2014                                03/28/2014 through 04/27/2014                                                                         Right breast 4250 cGy 17 sessions, right breast boost 1000 cGy in 4 sessions (hypofractionated)                         . Sleep apnea    uses a CPAP    Surgeries: Procedure(s): RIGHT TOTAL KNEE ARTHROPLASTY on 03/12/2016   Consultants:   Discharged Condition: Improved  Hospital Course: [name] is an 60 y.o. female who was admitted 03/12/2016 for operative treatment of<principal problem not specified>. Patient has severe unremitting pain that affects sleep, daily activities, and work/hobbies. After pre-op clearance the patient was taken to the operating room on 03/12/2016 and underwent  Procedure(s): RIGHT TOTAL KNEE ARTHROPLASTY.  Post operative course uneventful. Patient discharged after PT goals met.  Patient was given perioperative antibiotics: Anti-infectives    Start     Dose/Rate Route Frequency Ordered Stop   03/12/16 1515  ceFAZolin (ANCEF) IVPB 2g/100 mL premix     2 g 200 mL/hr over 30 Minutes Intravenous Every 6 hours 03/12/16 1439 03/12/16 2149   03/12/16 0930  ceFAZolin  (ANCEF) IVPB 2g/100 mL premix     2 g 200 mL/hr over 30 Minutes Intravenous To ShortStay Surgical 03/11/16 1052 03/12/16 1005       Patient was given sequential compression devices, early ambulation, and chemoprophylaxis to prevent DVT.  Patient benefited maximally from hospital stay and there were no complications.    Recent vital signs: Patient Vitals for the past 24 hrs:  BP Temp Temp src Pulse Resp SpO2  03/13/16 0300 129/63 98.8 F (37.1 C) Oral 83 16 99 %  03/12/16 2100 - - - (!) 101 16 96 %  03/12/16 2013 (!) 151/63 97.6 F (36.4 C) Oral 96 16 95 %  03/12/16 1500 (!) 141/76 98.7 F (37.1 C) Oral (!) 108 16 95 %  03/12/16 1400 (!) 129/95 - - - - -  03/12/16 1350 92/72 98.6 F (37 C) - 87 16 93 %  03/12/16 1335 (!) 89/65 - - 78 18 97 %  03/12/16 1320 132/86 - - (!) 35 19 96 %  03/12/16 1305 130/80 - - 80 20 97 %  03/12/16 1250 (!) 141/79 - - 82 14 92 %  03/12/16 1235 136/79 - - 75 20 96 %  03/12/16 1220 (!) 143/78 - - (!) 107 18 92 %     Recent laboratory studies:  Recent Labs  03/13/16 0350  WBC 11.7*  HGB 11.2*  HCT 31.8*  PLT 142*  NA 138  K 4.8  CL 107  CO2 20*  BUN 13  CREATININE 0.77  GLUCOSE 161*  CALCIUM 8.0*     Discharge Medications:     Medication List    STOP taking these medications   aspirin 81 MG tablet Replaced by:  aspirin 325 MG EC tablet     TAKE these medications   ALPRAZolam 0.25 MG tablet Commonly known as:  XANAX Take 0.25 mg by mouth daily as needed for anxiety.   aspirin 325 MG EC tablet Take 1 tablet (325 mg total) by mouth daily with breakfast. Start taking on:  03/14/2016 Replaces:  aspirin 81 MG tablet   celecoxib 200 MG capsule Commonly known as:  CELEBREX Take 1 capsule (200 mg total) by mouth every 12 (twelve) hours.   diltiazem 240 MG 24 hr capsule Commonly known as:  CARDIZEM CD Take 1 capsule (240 mg total) by mouth daily.   methocarbamol 500 MG tablet Commonly known as:  ROBAXIN Take 1 tablet (500  mg total) by mouth every 6 (six) hours as needed for muscle spasms.   ondansetron 4 MG tablet Commonly known as:  ZOFRAN Take 1 tablet (4 mg total) by mouth every 6 (six) hours as needed for nausea.   oxyCODONE-acetaminophen 5-325 MG tablet Commonly known as:  PERCOCET/ROXICET Take 1-2 tablets by mouth every 4 (four) hours as needed for moderate pain or severe pain.   propafenone 225 MG 12 hr capsule Commonly known as:  RYTHMOL SR TAKE 1 CAPSULE BY MOUTH EVERY 12 HOURS What changed:  how much to take  how to take this  when to take this  additional instructions   rosuvastatin 5 MG tablet Commonly known as:  CRESTOR Take 1 tablet (5 mg total) by mouth daily.   tamoxifen 20 MG tablet Commonly known as:  NOLVADEX Take 1 tablet (20 mg total) by mouth daily.   VITAMIN D (CHOLECALCIFEROL) PO Take 1,000 Units by mouth daily.   zolpidem 10 MG tablet Commonly known as:  AMBIEN Take 10 mg by mouth at bedtime as needed for sleep.       Diagnostic Studies: Dg Chest 2 View  Result Date: 02/29/2016 CLINICAL DATA:  Preoperative evaluation for total knee replacement. Hypertension. History of breast carcinoma EXAM: CHEST  2 VIEW COMPARISON:  June 27, 2013 FINDINGS: There is no edema or consolidation. The heart size and pulmonary vascularity are normal. No adenopathy. There is postoperative change over the right breast region. No bone lesions. IMPRESSION: No edema or consolidation. Electronically Signed   By: Lowella Grip III M.D.   On: 02/29/2016 12:29   Dg Knee Right Port  Result Date: 03/12/2016 CLINICAL DATA:  Postop knee replacement EXAM: PORTABLE RIGHT KNEE - 1-2 VIEW COMPARISON:  None FINDINGS: Components of RIGHT knee prosthesis identified. No acute fracture, dislocation, or bone destruction. Bones appear slightly demineralized. Expected soft tissue swelling and soft tissue gas from preceding surgery. IMPRESSION: RIGHT knee prosthesis without acute complication.  Electronically Signed   By: Lavonia Dana M.D.   On: 03/12/2016 13:20    Disposition: 01-Home or Self Care  Discharge Instructions    CPM    Complete by:  As directed    Continuous passive motion machine (CPM):      Use the CPM from 0 to 60 for 6-8 hours per day.      You may increase by 5-10 per day.  You may break it up into 2 or 3 sessions per day.      Use CPM for 3-4 weeks or until you are told to stop.   Call MD / Call 911    Complete by:  As directed    If you experience chest pain or shortness of breath, CALL 911 and be transported to the hospital emergency room.  If you develope a fever above 101 F, pus (white drainage) or increased drainage or redness at the wound, or calf pain, call your surgeon's office.   Change dressing    Complete by:  As directed    Change the dressing daily with sterile 4 x 4 inch gauze dressing and apply TED hose.  You may clean the incision with alcohol prior to redressing.   Constipation Prevention    Complete by:  As directed    Drink plenty of fluids.  Prune juice and/or coffee may be helpful.  You may use a stool softener, such as Colace (over the counter) 100 mg twice a day.  Use MiraLax (over the counter) for constipation as needed but this may take several days to work.  Mag Citrate --OR-- Milk of Magnesia may also be used but follow directions on the label.   Diet - low sodium heart healthy    Complete by:  As directed    Discharge instructions    Complete by:  As directed    Place blue foam block, curve side up under heel at all times except when in CPM or when walking.  DO NOT modify, tear, cut, or change in any way the gray foam block Continuous passive motion machine (CPM):      Use the CPM from 0 to 90 for 6 hours per day.       You may break it up into 2 or 3 sessions per day.      Use CPM for 2 weeks or until you are told to stop..   Do not put a pillow under the knee. Place it under the heel.    Complete by:  As directed    Place  yellow block under heel at all times except when up walking or in CPM.  You must sleep in it at night   Increase activity slowly as tolerated    Complete by:  As directed    Patient may shower    Complete by:  As directed    You may shower over the brown dressing.  Once the dressing is removed you may shower without a dressing once there is no drainage.  Do not wash over the wound.  If drainage remains, cover wound with plastic wrap and then shower   TED hose    Complete by:  As directed    Use stockings (TED hose) for 2 weeks on both leg(s).  You may remove them at night for sleeping.      Follow-up Information    [name], MD Follow up in 2 week(s).   Specialty:  Orthopedic Surgery Contact information: [address] [place] [address] [phone]            Signed: Nehemiah Massed 03/13/2016, 11:59 AM

## 2016-03-14 LAB — CBC
HCT: 31.8 % — ABNORMAL LOW (ref 36.0–46.0)
HEMOGLOBIN: 10.6 g/dL — AB (ref 12.0–15.0)
MCH: 31.7 pg (ref 26.0–34.0)
MCHC: 33.3 g/dL (ref 30.0–36.0)
MCV: 95.2 fL (ref 78.0–100.0)
PLATELETS: 146 10*3/uL — AB (ref 150–400)
RBC: 3.34 MIL/uL — AB (ref 3.87–5.11)
RDW: 12.3 % (ref 11.5–15.5)
WBC: 12.7 10*3/uL — AB (ref 4.0–10.5)

## 2016-03-14 LAB — BASIC METABOLIC PANEL
ANION GAP: 5 (ref 5–15)
BUN: 14 mg/dL (ref 6–20)
CHLORIDE: 106 mmol/L (ref 101–111)
CO2: 26 mmol/L (ref 22–32)
Calcium: 8.2 mg/dL — ABNORMAL LOW (ref 8.9–10.3)
Creatinine, Ser: 0.64 mg/dL (ref 0.44–1.00)
Glucose, Bld: 121 mg/dL — ABNORMAL HIGH (ref 65–99)
POTASSIUM: 3.9 mmol/L (ref 3.5–5.1)
SODIUM: 137 mmol/L (ref 135–145)

## 2016-03-14 MED ORDER — OXYCODONE HCL ER 20 MG PO T12A: 20.0000 mg | EXTENDED_RELEASE_TABLET | Freq: Three times a day (TID) | ORAL | 0 refills | Status: DC

## 2016-03-14 MED FILL — OxyCONTIN 20 MG T12A: 20 | 13 days supply | Qty: 40 | Fill #0

## 2016-03-14 MED FILL — CELECOXIB 200 MG CAPSULE: 200 | 30 days supply | Qty: 60 | Fill #0

## 2016-03-14 MED FILL — OXYCODONE W/APAP 5/325 TAB: 5-325 | 5 days supply | Qty: 60 | Fill #0

## 2016-03-14 MED FILL — METHOCARBAMOL 500 MG TABLET: 500 | 8 days supply | Qty: 30 | Fill #0

## 2016-03-14 NOTE — Progress Notes (Signed)
Physical Therapy Treatment Patient Details Name: Pamela Underwood MRN: [id number] DOB: 1956-01-19 Today's Date: 03/14/2016    History of Present Illness Patient is a 60 y/o female with hx of left TKA, colitis, breast ca, back pain presents s/p right TKA.    PT Comments    Pt ready for d/c informed RN.  PTA reviewed stair training and HEP during session.    Follow Up Recommendations  Home health PT;Supervision - Intermittent     Equipment Recommendations  None recommended by PT    Recommendations for Other Services       Precautions / Restrictions Precautions Precautions: Knee;Fall Precaution Booklet Issued: Yes (comment) Precaution Comments: HEP provided yesterday Restrictions Weight Bearing Restrictions: Yes RLE Weight Bearing: Weight bearing as tolerated    Mobility  Bed Mobility Overal bed mobility: Needs Assistance Bed Mobility: Supine to Sit;Sit to Supine     Supine to sit: Supervision Sit to supine: Supervision   General bed mobility comments: Out of bed in chair on OT arrival.  Transfers Overall transfer level: Needs assistance Equipment used: Rolling walker (2 wheeled) Transfers: Sit to/from Stand Sit to Stand: Supervision         General transfer comment: Min guard for safety. Stood from [employer], from chair x2, from [facility] x1.  Ambulation/Gait Ambulation/Gait assistance: Min guard Ambulation Distance (Feet): 400 Feet Assistive device: Rolling walker (2 wheeled) Gait Pattern/deviations: Step-through pattern Gait velocity: decreased Gait velocity interpretation: Below normal speed for age/gender General Gait Details: Cues for R heel strike to encourage knee extension in stance phase.     Stairs Stairs: Yes Stairs assistance: Min guard Stair Management: No rails;With walker Number of Stairs: 2 General stair comments: Cues for sequencing and RW placement, Pt performed x2 trials for clarity.    Wheelchair Mobility    Modified Rankin (Stroke  Patients Only)       Balance Overall balance assessment: Needs assistance   Sitting balance-Leahy Scale: Good       Standing balance-Leahy Scale: Fair                      Cognition Arousal/Alertness: Awake/alert Behavior During Therapy: WFL for tasks assessed/performed Overall Cognitive Status: Within Functional Limits for tasks assessed                      Exercises Total Joint Exercises Ankle Circles/Pumps: AROM;10 reps;Supine Quad Sets: AROM;Right;10 reps;Supine Towel Squeeze: AROM;Both;10 reps;Supine Short Arc Quad: AROM;Right;10 reps;Supine Heel Slides: Right;10 reps;AROM;Supine Hip ABduction/ADduction: AROM;Right;10 reps;Supine Straight Leg Raises: Strengthening;Right;10 reps;AROM;Supine Long Arc Quad: Strengthening;Right;10 reps Goniometric ROM: 87 degrees R knee flexion.      General Comments        Pertinent Vitals/Pain Pain Assessment: 0-10 Pain Score: 6  Pain Location: R knee Pain Descriptors / Indicators: Aching Pain Intervention(s): Monitored during session;Repositioned;Ice applied    Home Living                      Prior Function            PT Goals (current goals can now be found in the care plan section) Acute Rehab PT Goals Patient Stated Goal: to go home Potential to Achieve Goals: Good Progress towards PT goals: Progressing toward goals    Frequency    7X/week      PT Plan Current plan remains appropriate    Co-evaluation             End  of Session Equipment Utilized During Treatment: Gait belt Activity Tolerance: Patient tolerated treatment well Patient left: in bed;in CPM;with call bell/phone within reach;with SCD's reapplied     Time: 1012-1032 PT Time Calculation (min) (ACUTE ONLY): 20 min  Charges:  $Gait Training: 8-22 mins                    G Codes:      Cristela Blue 03-28-16, 10:39 AM  Governor Rooks, PTA pager [phone]

## 2016-03-14 NOTE — Discharge Summary (Signed)
Joni Fears, MD   Biagio Borg, PA-C [address] [place], [place]  [address]                             [phone]  PATIENT ID: Pamela Underwood        MRN:  [id number]          DOB/AGE: [birth year] / 60 y.o.    DISCHARGE SUMMARY  ADMISSION DATE:    03/12/2016 DISCHARGE DATE:   03/14/2016   ADMISSION DIAGNOSIS: djd right knee    DISCHARGE DIAGNOSIS:  degenerative joint disease, right knee    ADDITIONAL DIAGNOSIS: Active Problems:   S/P total knee arthroplasty  Past Medical History:  Diagnosis Date  . Arthritis   . Atrial tachycardia ([place])    takes rythmol daily and cardiazem daily  . Back pain    occasionally  . Breast cancer ([place]) 02/22/14   right upper inner  . Chronic anxiety    takes Xanax daily as needed when heart rate going up  . Colitis    hx-one  . Dysrhythmia    atrial tachycardia- takes meds and controls it  . Elevated BP 06/06/2015  . Insomnia    takes Ambien nightly as needed  . Joint pain   . Joint swelling   . S/P radiation therapy 03/28/2014 through 04/27/2014                                03/28/2014 through 04/27/2014                                                                         Right breast 4250 cGy 17 sessions, right breast boost 1000 cGy in 4 sessions (hypofractionated)                         . Sleep apnea    uses a CPAP    PROCEDURE: Procedure(s): RIGHT TOTAL KNEE ARTHROPLASTY  on 03/12/2016  CONSULTS: none    HISTORY: Eda Paschal, 60 y.o. female, has a history of pain and functional disability in the right knee due to arthritis and has failed non-surgical conservative treatments for greater than 12 weeks to includeNSAID's and/or analgesics, corticosteriod injections, viscosupplementation injections and activity modification.  Onset of symptoms was gradual, starting 15 years ago with gradually worsening course since that time. The patient noted no past surgery on the left knee(s).  Patient currently rates pain in  the left knee(s) at 7 out of 10 with activity. Patient has night pain, worsening of pain with activity and weight bearing, pain that interferes with activities of daily living, pain with passive range of motion, crepitus and joint swelling.  Patient has evidence of subchondral sclerosis, periarticular osteophytes and joint space narrowing by imaging studies.  There is no active infection.  HOSPITAL COURSE:  BRINLY SHUTLER is a 60 y.o. admitted on 03/12/2016 and found to have a diagnosis of degenerative joint disease, right knee.  After appropriate laboratory studies were obtained  they were taken to the operating room on 03/12/2016 and underwent  Procedure(s): RIGHT TOTAL KNEE  ARTHROPLASTY  .   They were given perioperative antibiotics:  Anti-infectives    Start     Dose/Rate Route Frequency Ordered Stop   03/12/16 1515  ceFAZolin (ANCEF) IVPB 2g/100 mL premix     2 g 200 mL/hr over 30 Minutes Intravenous Every 6 hours 03/12/16 1439 03/12/16 2149   03/12/16 0930  ceFAZolin (ANCEF) IVPB 2g/100 mL premix     2 g 200 mL/hr over 30 Minutes Intravenous To ShortStay Surgical 03/11/16 1052 03/12/16 1005    .  Tolerated the procedure well.  .Patient was given sequential compression devices, early ambulation, and chemoprophylaxis to prevent DVT.  Patient benefited maximally from hospital stay and there were no complications.     The remainder of the hospital course was dedicated to ambulation and strengthening.   The patient was discharged on 2 Days Post-Op in  Stable condition.  Blood products given:none  DIAGNOSTIC STUDIES: Recent vital signs: Patient Vitals for the past 24 hrs:  BP Temp Temp src Pulse Resp SpO2  03/14/16 0519 125/89 98 F (36.7 C) Oral 89 18 97 %  03/13/16 2016 127/77 98.5 F (36.9 C) Oral 100 18 96 %  03/13/16 1415 (!) 110/23 98.5 F (36.9 C) Oral 79 18 98 %       Recent laboratory studies:  Recent Labs  03/13/16 0350 03/14/16 0613  WBC 11.7* 12.7*  HGB  11.2* 10.6*  HCT 31.8* 31.8*  PLT 142* 146*    Recent Labs  03/13/16 0350 03/14/16 0613  NA 138 137  K 4.8 3.9  CL 107 106  CO2 20* 26  BUN 13 14  CREATININE 0.77 0.64  GLUCOSE 161* 121*  CALCIUM 8.0* 8.2*   Lab Results  Component Value Date   INR 0.92 02/29/2016   INR 0.94 06/27/2013     Recent Radiographic Studies :  Dg Chest 2 View  Result Date: 02/29/2016 CLINICAL DATA:  Preoperative evaluation for total knee replacement. Hypertension. History of breast carcinoma EXAM: CHEST  2 VIEW COMPARISON:  June 27, 2013 FINDINGS: There is no edema or consolidation. The heart size and pulmonary vascularity are normal. No adenopathy. There is postoperative change over the right breast region. No bone lesions. IMPRESSION: No edema or consolidation. Electronically Signed   By: Lowella Grip III M.D.   On: 02/29/2016 12:29   Dg Knee Right Port  Result Date: 03/12/2016 CLINICAL DATA:  Postop knee replacement EXAM: PORTABLE RIGHT KNEE - 1-2 VIEW COMPARISON:  None FINDINGS: Components of RIGHT knee prosthesis identified. No acute fracture, dislocation, or bone destruction. Bones appear slightly demineralized. Expected soft tissue swelling and soft tissue gas from preceding surgery. IMPRESSION: RIGHT knee prosthesis without acute complication. Electronically Signed   By: Lavonia Dana M.D.   On: 03/12/2016 13:20    DISCHARGE INSTRUCTIONS: Discharge Instructions    CPM    Complete by:  As directed    Continuous passive motion machine (CPM):      Use the CPM from 0 to 60 for 6-8 hours per day.      You may increase by 5-10 per day.  You may break it up into 2 or 3 sessions per day.      Use CPM for 3-4 weeks or until you are told to stop.   Call MD / Call 911    Complete by:  As directed    If you experience chest pain or shortness of breath, CALL 911 and be transported to the hospital emergency room.  If  you develope a fever above 101 F, pus (white drainage) or increased drainage or  redness at the wound, or calf pain, call your surgeon's office.   Change dressing    Complete by:  As directed    Change the dressing daily with sterile 4 x 4 inch gauze dressing and apply TED hose.  You may clean the incision with alcohol prior to redressing.   Constipation Prevention    Complete by:  As directed    Drink plenty of fluids.  Prune juice and/or coffee may be helpful.  You may use a stool softener, such as Colace (over the counter) 100 mg twice a day.  Use MiraLax (over the counter) for constipation as needed but this may take several days to work.  Mag Citrate --OR-- Milk of Magnesia may also be used but follow directions on the label.   Diet - low sodium heart healthy    Complete by:  As directed    Discharge instructions    Complete by:  As directed    Place blue foam block, curve side up under heel at all times except when in CPM or when walking.  DO NOT modify, tear, cut, or change in any way the gray foam block Continuous passive motion machine (CPM):      Use the CPM from 0 to 90 for 6 hours per day.       You may break it up into 2 or 3 sessions per day.      Use CPM for 2 weeks or until you are told to stop..   Do not put a pillow under the knee. Place it under the heel.    Complete by:  As directed    Place yellow block under heel at all times except when up walking or in CPM.  You must sleep in it at night   Increase activity slowly as tolerated    Complete by:  As directed    Patient may shower    Complete by:  As directed    You may shower over the brown dressing.  Once the dressing is removed you may shower without a dressing once there is no drainage.  Do not wash over the wound.  If drainage remains, cover wound with plastic wrap and then shower   TED hose    Complete by:  As directed    Use stockings (TED hose) for 2 weeks on both leg(s).  You may remove them at night for sleeping.      DISCHARGE MEDICATIONS:     Medication List    STOP taking these  medications   aspirin 81 MG tablet Replaced by:  aspirin 325 MG EC tablet     TAKE these medications   ALPRAZolam 0.25 MG tablet Commonly known as:  XANAX Take 0.25 mg by mouth daily as needed for anxiety.   aspirin 325 MG EC tablet Take 1 tablet (325 mg total) by mouth daily with breakfast. Replaces:  aspirin 81 MG tablet   celecoxib 200 MG capsule Commonly known as:  CELEBREX Take 1 capsule (200 mg total) by mouth every 12 (twelve) hours.   diltiazem 240 MG 24 hr capsule Commonly known as:  CARDIZEM CD Take 1 capsule (240 mg total) by mouth daily.   methocarbamol 500 MG tablet Commonly known as:  ROBAXIN Take 1 tablet (500 mg total) by mouth every 6 (six) hours as needed for muscle spasms.   ondansetron 4 MG tablet Commonly known as:  ZOFRAN Take 1 tablet (4 mg total) by mouth every 6 (six) hours as needed for nausea.   oxyCODONE 20 mg 12 hr tablet Commonly known as:  OXYCONTIN Take 1 tablet (20 mg total) by mouth every 8 (eight) hours.   oxyCODONE-acetaminophen 5-325 MG tablet Commonly known as:  PERCOCET/ROXICET Take 1-2 tablets by mouth every 4 (four) hours as needed for moderate pain or severe pain.   propafenone 225 MG 12 hr capsule Commonly known as:  RYTHMOL SR TAKE 1 CAPSULE BY MOUTH EVERY 12 HOURS What changed:  how much to take  how to take this  when to take this  additional instructions   rosuvastatin 5 MG tablet Commonly known as:  CRESTOR Take 1 tablet (5 mg total) by mouth daily.   tamoxifen 20 MG tablet Commonly known as:  NOLVADEX Take 1 tablet (20 mg total) by mouth daily.   VITAMIN D (CHOLECALCIFEROL) PO Take 1,000 Units by mouth daily.   zolpidem 10 MG tablet Commonly known as:  AMBIEN Take 10 mg by mouth at bedtime as needed for sleep.       FOLLOW UP VISIT:   Follow-up Information    [name], MD Follow up in 2 week(s).   Specialty:  Orthopedic Surgery Contact information: [address] [place] [address] [phone]           DISPOSITION:   Home  CONDITION:  Stable   Mike Craze. [place], [place] [phone]  03/14/2016 8:36 AM

## 2016-03-14 NOTE — Progress Notes (Signed)
Reviewed discharge instructions/medications with patient. Answered all of her questions.  She is waiting on her daughter to pick her up.

## 2016-03-14 NOTE — Consult Note (Signed)
   [facility] CM Inpatient Consult   03/14/2016  Pamela Underwood 1956/03/22 [id number]    Came to visit [name] at bedside prior to discharge on behalf of Link to [facility] Care Management program for [facility] employees/dependents with [employer] insurance. Discussed Link to Wellness program. She denies any Link to Wellness needs at this time. Link to [employer], 24-hr nurse line magnet, contact information provided. Agreeable to post discharge call. Confirmed best number as [phone]. States she will have home health thru [place]. Spoke with inpatient RNCM.   Marthenia Rolling, MSN-Ed, RN,BSN [facility] Liaison [phone]

## 2016-03-15 DIAGNOSIS — Z96651 Presence of right artificial knee joint: Secondary | ICD-10-CM | POA: Diagnosis not present

## 2016-03-15 DIAGNOSIS — Z79891 Long term (current) use of opiate analgesic: Secondary | ICD-10-CM | POA: Diagnosis not present

## 2016-03-15 DIAGNOSIS — Z471 Aftercare following joint replacement surgery: Secondary | ICD-10-CM | POA: Diagnosis not present

## 2016-03-18 ENCOUNTER — Other Ambulatory Visit: Payer: Self-pay | Admitting: *Deleted

## 2016-03-18 ENCOUNTER — Encounter: Payer: Self-pay | Admitting: *Deleted

## 2016-03-18 DIAGNOSIS — Z471 Aftercare following joint replacement surgery: Secondary | ICD-10-CM | POA: Diagnosis not present

## 2016-03-18 DIAGNOSIS — Z96651 Presence of right artificial knee joint: Secondary | ICD-10-CM | POA: Diagnosis not present

## 2016-03-18 DIAGNOSIS — Z79891 Long term (current) use of opiate analgesic: Secondary | ICD-10-CM | POA: Diagnosis not present

## 2016-03-18 NOTE — Patient Outreach (Addendum)
[facility]) Care Management  03/18/2016  Pamela Underwood 24-Jan-1956 [id number]  Subjective: Telephone call to patient's home number, spoke with patient, and HIPAA verified.   Discussed [facility] Care Management Transition of Care follow up and Link to Wellness.   Patient in agreement to transition of care follow up.   Patient states she is doing well and has a follow up appointment scheduled with surgeon on 03/25/16.   States she is update with all of her wellness follow up, follows up with specialist as appropriate, and utilizes her gynecologist as primary MD.  States she has enough MDs telling her what to do and does want another primary MD at this time.   States she is receiving home health through [employer].    States she has a hospital bed, has assistance as needed with activities of daily living and home management.   States she has family medical leave act paperwork in place through [employer].  States she is in the process of filing supplemental insurance (hospital indemnity ) and will contact patient accounting for an itemized bill.    RNCM educated patient on supplemental insurance, patient voiced understanding, and states she is appreciative of the information.   States she utilizes the [facility] outpatient pharmacy for medications.  Patient states she does not have any transition of care, care coordination, disease management, disease monitoring, transportation, community resource, or pharmacy needs at this time.    States she has Link to [employer] and is in agreement to receive successful outreach letter.  States she is very appreciative of the follow up call.  Objective: Per chart review: Patient hospitalized 03/12/16 - 03/14/16 for Right knee end-stage arthritis.   Status post Modified minimally invasive right total knee replacement with Stryker Triathlon prosthesis.  A cemented cruciate-retaining #3 femoral component.  Cemented #4 tibial component, 9 mm CS  insert. Cemented resurfacing 32 mm patellar component on 03/13/16.   Patient also has a history of breast cancer and sleep apnea.  Assessment:  Received UMR Transition of care referral on 03/14/16.  Transition of care follow up completed.   Patient has no Telephonic RNCM needs or Link to Wellness needs,  at this time and will proceed with case closure.    Plan: RNCM will send patient successful outreach letter. RNCM will send case closure due to follow up completed / no care management needs request to [facility].  Dinesh Ulysse H. Annia Friendly, BSN, [place] [facility] Telephonic CM Phone: [phone] Fax: [phone]

## 2016-03-19 DIAGNOSIS — Z79891 Long term (current) use of opiate analgesic: Secondary | ICD-10-CM | POA: Diagnosis not present

## 2016-03-19 DIAGNOSIS — Z471 Aftercare following joint replacement surgery: Secondary | ICD-10-CM | POA: Diagnosis not present

## 2016-03-19 DIAGNOSIS — Z96651 Presence of right artificial knee joint: Secondary | ICD-10-CM | POA: Diagnosis not present

## 2016-03-21 DIAGNOSIS — Z96651 Presence of right artificial knee joint: Secondary | ICD-10-CM | POA: Diagnosis not present

## 2016-03-21 DIAGNOSIS — Z79891 Long term (current) use of opiate analgesic: Secondary | ICD-10-CM | POA: Diagnosis not present

## 2016-03-21 DIAGNOSIS — Z471 Aftercare following joint replacement surgery: Secondary | ICD-10-CM | POA: Diagnosis not present

## 2016-03-24 MED FILL — HYDROCODON-APAP 5-325: 5-325 | 20 days supply | Qty: 60 | Fill #0

## 2016-03-25 DIAGNOSIS — Z96651 Presence of right artificial knee joint: Secondary | ICD-10-CM | POA: Diagnosis not present

## 2016-03-25 DIAGNOSIS — M1711 Unilateral primary osteoarthritis, right knee: Secondary | ICD-10-CM | POA: Diagnosis not present

## 2016-03-25 DIAGNOSIS — Z471 Aftercare following joint replacement surgery: Secondary | ICD-10-CM | POA: Diagnosis not present

## 2016-03-25 DIAGNOSIS — Z79891 Long term (current) use of opiate analgesic: Secondary | ICD-10-CM | POA: Diagnosis not present

## 2016-03-26 DIAGNOSIS — Z96651 Presence of right artificial knee joint: Secondary | ICD-10-CM | POA: Diagnosis not present

## 2016-03-26 DIAGNOSIS — M1711 Unilateral primary osteoarthritis, right knee: Secondary | ICD-10-CM | POA: Diagnosis not present

## 2016-03-26 MED FILL — ZOLPIDEM TARTRATE 10 MG TAB: 10 | 30 days supply | Qty: 30 | Fill #5

## 2016-03-26 MED FILL — ALPRAZolam 0.25 MG TABS: 0.25 | 30 days supply | Qty: 30 | Fill #1

## 2016-03-27 DIAGNOSIS — Z79891 Long term (current) use of opiate analgesic: Secondary | ICD-10-CM | POA: Diagnosis not present

## 2016-03-27 DIAGNOSIS — Z96651 Presence of right artificial knee joint: Secondary | ICD-10-CM | POA: Diagnosis not present

## 2016-03-27 DIAGNOSIS — Z471 Aftercare following joint replacement surgery: Secondary | ICD-10-CM | POA: Diagnosis not present

## 2016-03-31 ENCOUNTER — Ambulatory Visit: Payer: 59 | Attending: Orthopedic Surgery | Admitting: Physical Therapy

## 2016-03-31 DIAGNOSIS — R2689 Other abnormalities of gait and mobility: Secondary | ICD-10-CM | POA: Diagnosis not present

## 2016-03-31 DIAGNOSIS — G8929 Other chronic pain: Secondary | ICD-10-CM | POA: Diagnosis not present

## 2016-03-31 DIAGNOSIS — M6281 Muscle weakness (generalized): Secondary | ICD-10-CM | POA: Insufficient documentation

## 2016-03-31 DIAGNOSIS — M25561 Pain in right knee: Secondary | ICD-10-CM | POA: Insufficient documentation

## 2016-03-31 DIAGNOSIS — M25661 Stiffness of right knee, not elsewhere classified: Secondary | ICD-10-CM | POA: Insufficient documentation

## 2016-03-31 DIAGNOSIS — R600 Localized edema: Secondary | ICD-10-CM | POA: Diagnosis not present

## 2016-03-31 NOTE — Therapy (Signed)
[place], [place], [address] Phone: [phone]   Fax:  [phone]  Physical Therapy Evaluation  Patient Details  Name: Pamela Underwood MRN: [id number] Date of Birth: 09/19/1955 Referring Provider: Kathryne Hitch MD  Encounter Date: 03/31/2016      PT End of Session - 03/31/16 1747    Visit Number 1   Number of Visits 13   Date for PT Re-Evaluation 05/12/16   Authorization Type [facility] UMR   PT Start Time 1500   PT Stop Time 1547   PT Time Calculation (min) 47 min   Activity Tolerance Patient tolerated treatment well   Behavior During Therapy [facility] for tasks assessed/performed      Past Medical History:  Diagnosis Date  . Arthritis   . Atrial tachycardia ([place])    takes rythmol daily and cardiazem daily  . Back pain    occasionally  . Breast cancer ([place]) 02/22/14   right upper inner  . Chronic anxiety    takes Xanax daily as needed when heart rate going up  . Colitis    hx-one  . Dysrhythmia    atrial tachycardia- takes meds and controls it  . Elevated BP 06/06/2015  . Insomnia    takes Ambien nightly as needed  . Joint pain   . Joint swelling   . S/P radiation therapy 03/28/2014 through 04/27/2014                                03/28/2014 through 04/27/2014                                                                         Right breast 4250 cGy 17 sessions, right breast boost 1000 cGy in 4 sessions (hypofractionated)                         . Sleep apnea    uses a CPAP    Past Surgical History:  Procedure Laterality Date  . BREAST BIOPSY Left 05/02/1999   benign  . BREAST LUMPECTOMY WITH AXILLARY LYMPH NODE BIOPSY Right 02/22/14   upper inner  . COLONOSCOPY    . CYSTOSCOPY    . KNEE ARTHROSCOPY WITH MEDIAL MENISECTOMY Left 03/31/2013   Procedure: LEFT KNEE ARTHROSCOPY WITH PARTIAL MEDIAL MENISECTOMY, CHONDROPLASTY OF PATELLA  FEMORAL JOINT, EXCISION OF MEDIAL PLICA, PARTIAL LATERAL  MENISECTOMY;  Surgeon: [name], MD;  Location: [place];  Service: Orthopedics;  Laterality: Left;  . TOTAL KNEE ARTHROPLASTY Left 06/29/2013   Procedure: TOTAL KNEE ARTHROPLASTY;  Surgeon: [name], MD;  Location: [place];  Service: Orthopedics;  Laterality: Left;  . TOTAL KNEE ARTHROPLASTY Right 03/12/2016  . TOTAL KNEE ARTHROPLASTY Right 03/12/2016   Procedure: RIGHT TOTAL KNEE ARTHROPLASTY;  Surgeon: [name], MD;  Location: [place];  Service: Orthopedics;  Laterality: Right;  . TUBAL LIGATION      There were no vitals filed for this visit.       Subjective Assessment - 03/31/16 1510    Subjective pt is a 60 y.o F With CC of R knee pain s/p  R  TKA on 03/12/16.  Since the surgery things have been going well, still having some pain in the R knee. currently utilizes one crutch to ambulate which she started a couple days ago when she started from the RW. Still having swelling but been pro-active about icing with compression.  been doing previously doing home health PT which she has been consistent with the exercise.    Pertinent History hx or L TKA    Limitations Lifting;Standing;Walking;House hold activities   How long can you sit comfortably? 30 min   How long can you stand comfortably? 5-10 min   How long can you walk comfortably? 5-10 min   Diagnostic tests x-ray prior to surgery   Patient Stated Goals to get back to normal, get back to exercise, increase walking/ standing endurance   Currently in Pain? Yes   Pain Score 4   last took pain meds at 8;30   Pain Location Knee   Pain Orientation Right;Medial;Anterior   Pain Descriptors / Indicators Aching;Throbbing;Sore   Pain Type Surgical pain   Pain Onset More than a month ago   Pain Frequency Constant   Aggravating Factors  doing the exercises   Pain Relieving Factors prlonged standing/ walking, sitting, ice, pain medication            [facility] PT Assessment - 03/31/16 1505      Assessment    Medical Diagnosis R TKA   Referring Provider Kathryne Hitch MD   Onset Date/Surgical Date 03/12/16   Hand Dominance Right   Next MD Visit --  04/22/2016   Prior Therapy yes   PT for L knee and, HHPT for R Knee     Precautions   Precautions None     Restrictions   Weight Bearing Restrictions No     Balance Screen   Has the patient fallen in the past 6 months No   Has the patient had a decrease in activity level because of a fear of falling?  No   Is the patient reluctant to leave their home because of a fear of falling?  No     Home Environment   Living Environment Private residence   Living Arrangements Spouse/significant other   Available Help at Discharge Available PRN/intermittently   Type of [place] to enter   Entrance Stairs-Number of Steps 1   Entrance Stairs-Rails None   Home Layout Two level   Alternate Level Stairs-Number of Steps 14   Alternate Level Stairs-Rails Right   Home Equipment Walker - 2 wheels;Crutches     Prior Function   Level of [place] Independent;Independent with basic ADLs   Vocation Full time employment  OR Nurse   Vocation Requirements prolonged standing, walking, running, lifting/ carrying moving   Leisure seeing family, traveling     Cognition   Overall Cognitive Status Within Functional Limits for tasks assessed     Observation/Other Assessments   Focus on Therapeutic Outcomes (FOTO)  63% limited   predicted 46% limited     Posture/Postural Control   Posture/Postural Control Postural limitations   Postural Limitations Rounded Shoulders;Forward head     ROM / Strength   AROM / PROM / Strength AROM;PROM;Strength     AROM   AROM Assessment Site Knee   Right/Left Knee Right;Left   Right Knee Extension -10  ERP   Right Knee Flexion 96  ERP   Left Knee Extension -5   Left Knee Flexion 122  PROM   PROM Assessment Site Knee   Right/Left Knee Right   Right Knee Extension -9  ERP   Right Knee  Flexion 98  ERP     Strength   Strength Assessment Site Knee;Hip   Right/Left Hip Right;Left   Right Hip Flexion 3+/5   Right Hip ABduction 4-/5  hand placement just proximal to knee   Right Hip ADduction 4/5   Left Hip Flexion 4/5   Left Hip ABduction 4-/5   Left Hip ADduction 4/5   Right/Left Knee Left;Right   Right Knee Flexion 3/5  pain during assessment in the lateral aspect   Right Knee Extension 4-/5   Left Knee Flexion 5/5   Left Knee Extension 5/5     Palpation   Palpation comment tenderness in the anterior asepct of the knee along the incision and the medial joint line.      Ambulation/Gait   Ambulation/Gait Yes   Assistive device Crutches  one crutch on L side   Gait Pattern Step-through pattern;Decreased stride length;Decreased stance time - right;Decreased step length - left;Antalgic;Trendelenburg;Decreased trunk rotation;Trunk flexed                           PT Education - 03/31/16 1746    Education provided Yes   Education Details evaluation findings, POC, goals, HEP with proper form and treatment rationale, reviewed HHPT HEP    Person(s) Educated Patient   Methods Explanation;Verbal cues;Handout   Comprehension Verbalized understanding;Verbal cues required          PT Short Term Goals - 03/31/16 1754      PT SHORT TERM GOAL #1   Title pt will be I with inital HEP (04/21/2016)   Time 3   Period Weeks   Status New     PT SHORT TERM GOAL #2   Title pt will improve R knee flexion to >/= 105 degrees and extension to </= -6 degrees with </= 4/10 pain for functional progression (04/21/2016)   Time 3   Period Weeks   Status New     PT SHORT TERM GOAL #3   Title pt will be able to walk/ stand for >/= 15 min with no AD with </= 4/10 pain to assist with unaided ambulation to promote funcitonal mobility (04/21/2016)   Time 3   Period Weeks   Status New           PT Long Term Goals - 03/31/16 1757      PT LONG TERM GOAL #1    Title she will be I with all HEP given as of last visit (05/12/2016)   Time 6   Period Weeks   Status New     PT LONG TERM GOAL #2   Title pt will improve her R knee flexion to >/= 120 degrees and extension to </= -4 degrees  with </= 2/10 pain for functional and efficient gait pattern (05/12/2016)   Time 6   Period Weeks   Status New     PT LONG TERM GOAL #3   Title she will increase her RLE strength to >/= 4+/5 with </= 2/10 pain for prolonged walking/ standing activities (05/12/2016)   Time 6   Period Weeks   Status New     PT LONG TERM GOAL #4   Title pt will be able to sit/ stand and walk >/= 45 min with no AD with </= 2/10 pain for functional endurance required or  work and pt's personal goal of personal exercise (05/12/2016)   Time 6   Period Weeks   Status New     PT LONG TERM GOAL #5   Title she will increase her FOTO score to </= 46% limited to demontrate improvement at discharge (05/12/2016)   Time [place] - 03/31/16 1747    Clinical Impression Statement Mrs. Blehm presents to OPPT as a low complexity evaluation with CC of  R knee pain s/p R TKA on 03/12/2016. She exhibits limited AROM with ERP. weakness in the RLE compared bil with pain noted during testing that was increased with knee flexion.  continues an antalgic gait pattern with limited step length and stance time on the R with use of once crutch. she would benefit from physical therapy to decrease R knee pain, increase R knee AROM/ strength and return her to PLOF by addressing the deficits listed.    Rehab Potential Good   PT Frequency 2x / week   PT Duration 6 weeks   PT Treatment/Interventions ADLs/Self Care Home Management;Dry needling;Cryotherapy;Electrical Stimulation;Iontophoresis 4mg /ml Dexamethasone;Moist Heat;Traction;Ultrasound;Therapeutic activities;Therapeutic exercise;Neuromuscular re-education;Patient/family education;Taping;Vasopneumatic  Device;Passive range of motion;Gait training;Stair training;Manual techniques   PT Next Visit Plan assess/ review, knee mobs, AROM/ PROM, pre-gait quad activation, Bike for flexion, Vaso for swelling/ pain,    PT Home Exercise Plan heel slides with strap, clam shell, heel raises, quad set   Consulted and Agree with Plan of Care Patient      Patient will benefit from skilled therapeutic intervention in order to improve the following deficits and impairments:  Abnormal gait, Pain, Improper body mechanics, Increased edema, Decreased strength, Decreased range of motion, Increased fascial restricitons, Hypomobility, Decreased endurance, Decreased activity tolerance, Difficulty walking  Visit Diagnosis: Chronic pain of right knee - Plan: PT plan of care cert/re-cert  Stiffness of right knee, not elsewhere classified - Plan: PT plan of care cert/re-cert  Localized edema - Plan: PT plan of care cert/re-cert  Other abnormalities of gait and mobility - Plan: PT plan of care cert/re-cert  Muscle weakness (generalized) - Plan: PT plan of care cert/re-cert     Problem List Patient Active Problem List   Diagnosis Date Noted  . S/P total knee arthroplasty 03/12/2016  . Obesity (BMI 35.0-39.9 without comorbidity) 06/13/2015  . Elevated BP 06/06/2015  . Elevated LDL cholesterol level 08/17/2014  . Breast cancer of upper-inner quadrant of right female breast ([place]) 02/28/2014  . DJD (degenerative joint disease) of knee 06/29/2013  . Paroxysmal supraventricular tachycardia ([place]) 05/09/2013  . Fatigue 05/09/2013  . Screening for hypercholesterolemia 05/09/2013  . OSA (obstructive sleep apnea)   . Obesity (BMI 30-39.9)    Suleima Ohlendorf PT, DPT, LAT, ATC  03/31/16  6:04 PM      [facility] [facility] [address] [place], [place], [address] Phone: [phone]   Fax:  [phone]  Name: Pamela Underwood MRN: [id number] Date of Birth:  1956-02-18

## 2016-04-03 ENCOUNTER — Ambulatory Visit: Payer: 59 | Admitting: Physical Therapy

## 2016-04-03 DIAGNOSIS — M6281 Muscle weakness (generalized): Secondary | ICD-10-CM | POA: Diagnosis not present

## 2016-04-03 DIAGNOSIS — R2689 Other abnormalities of gait and mobility: Secondary | ICD-10-CM

## 2016-04-03 DIAGNOSIS — G8929 Other chronic pain: Secondary | ICD-10-CM

## 2016-04-03 DIAGNOSIS — M25561 Pain in right knee: Principal | ICD-10-CM

## 2016-04-03 DIAGNOSIS — M25661 Stiffness of right knee, not elsewhere classified: Secondary | ICD-10-CM | POA: Diagnosis not present

## 2016-04-03 DIAGNOSIS — R600 Localized edema: Secondary | ICD-10-CM | POA: Diagnosis not present

## 2016-04-03 MED FILL — CARTIA XT 240 MG CAPSULE: 240 | 90 days supply | Qty: 90 | Fill #0

## 2016-04-03 MED FILL — TAMOXIFEN 20 MG TABLET: 20 | 90 days supply | Qty: 90 | Fill #3

## 2016-04-03 MED FILL — HYDROCODON-APAP 5-325: 5-325 | 10 days supply | Qty: 60 | Fill #0

## 2016-04-03 NOTE — Therapy (Signed)
[place], [place], [address] Phone: [phone]   Fax:  [phone]  Physical Therapy Treatment  Patient Details  Name: Pamela Underwood MRN: [id number] Date of Birth: 05-18-1956 Referring Provider: Kathryne Hitch MD  Encounter Date: 04/03/2016      PT End of Session - 04/03/16 1555    Visit Number 2   Number of Visits 13   Date for PT Re-Evaluation 05/12/16   Authorization Type [facility] UMR   PT Start Time 0349   PT Stop Time 0445   PT Time Calculation (min) 56 min      Past Medical History:  Diagnosis Date  . Arthritis   . Atrial tachycardia ([place])    takes rythmol daily and cardiazem daily  . Back pain    occasionally  . Breast cancer ([place]) 02/22/14   right upper inner  . Chronic anxiety    takes Xanax daily as needed when heart rate going up  . Colitis    hx-one  . Dysrhythmia    atrial tachycardia- takes meds and controls it  . Elevated BP 06/06/2015  . Insomnia    takes Ambien nightly as needed  . Joint pain   . Joint swelling   . S/P radiation therapy 03/28/2014 through 04/27/2014                                03/28/2014 through 04/27/2014                                                                         Right breast 4250 cGy 17 sessions, right breast boost 1000 cGy in 4 sessions (hypofractionated)                         . Sleep apnea    uses a CPAP    Past Surgical History:  Procedure Laterality Date  . BREAST BIOPSY Left 05/02/1999   benign  . BREAST LUMPECTOMY WITH AXILLARY LYMPH NODE BIOPSY Right 02/22/14   upper inner  . COLONOSCOPY    . CYSTOSCOPY    . KNEE ARTHROSCOPY WITH MEDIAL MENISECTOMY Left 03/31/2013   Procedure: LEFT KNEE ARTHROSCOPY WITH PARTIAL MEDIAL MENISECTOMY, CHONDROPLASTY OF PATELLA  FEMORAL JOINT, EXCISION OF MEDIAL PLICA, PARTIAL LATERAL MENISECTOMY;  Surgeon: [name], MD;  Location: [place];  Service: Orthopedics;  Laterality:  Left;  . TOTAL KNEE ARTHROPLASTY Left 06/29/2013   Procedure: TOTAL KNEE ARTHROPLASTY;  Surgeon: [name], MD;  Location: [place];  Service: Orthopedics;  Laterality: Left;  . TOTAL KNEE ARTHROPLASTY Right 03/12/2016  . TOTAL KNEE ARTHROPLASTY Right 03/12/2016   Procedure: RIGHT TOTAL KNEE ARTHROPLASTY;  Surgeon: [name], MD;  Location: [place];  Service: Orthopedics;  Laterality: Right;  . TUBAL LIGATION      There were no vitals filed for this visit.      Subjective Assessment - 04/03/16 1550    Currently in Pain? Yes   Pain Score 5    Pain Location Knee   Pain Orientation Right;Medial;Anterior   Pain Descriptors / Indicators Throbbing;Heaviness  [place] Adult PT Treatment/Exercise - 04/03/16 0001      Knee/Hip Exercises: Stretches   Active Hamstring Stretch Right;1 rep;60 seconds   Knee: Self-Stretch to increase Flexion 1 rep;60 seconds   Knee: Self-Stretch Limitations heel slide with strap      Knee/Hip Exercises: Aerobic   Stationary Bike full revolutions x 5 minutes for ROM   Nustep L1 UE/LE x 5 minutes for ROM     Knee/Hip Exercises: Supine   Quad Sets 20 reps   Heel Slides 10 reps     Modalities   Modalities Vasopneumatic     Vasopneumatic   Number Minutes Vasopneumatic  15 minutes   Vasopnuematic Location  Knee   Vasopneumatic Pressure Medium   Vasopneumatic Temperature  32     Manual Therapy   Manual Therapy Joint mobilization;Soft tissue mobilization   Joint Mobilization A/P mobs and P/A mobs grade II  , patella mobs    Soft tissue mobilization medial knee                  PT Short Term Goals - 03/31/16 1754      PT SHORT TERM GOAL #1   Title pt will be I with inital HEP (04/21/2016)   Time 3   Period Weeks   Status New     PT SHORT TERM GOAL #2   Title pt will improve R knee flexion to >/= 105 degrees and extension to </= -6 degrees with </= 4/10 pain for functional progression  (04/21/2016)   Time 3   Period Weeks   Status New     PT SHORT TERM GOAL #3   Title pt will be able to walk/ stand for >/= 15 min with no AD with </= 4/10 pain to assist with unaided ambulation to promote funcitonal mobility (04/21/2016)   Time 3   Period Weeks   Status New           PT Long Term Goals - 03/31/16 1757      PT LONG TERM GOAL #1   Title she will be I with all HEP given as of last visit (05/12/2016)   Time 6   Period Weeks   Status New     PT LONG TERM GOAL #2   Title pt will improve her R knee flexion to >/= 120 degrees and extension to </= -4 degrees  with </= 2/10 pain for functional and efficient gait pattern (05/12/2016)   Time 6   Period Weeks   Status New     PT LONG TERM GOAL #3   Title she will increase her RLE strength to >/= 4+/5 with </= 2/10 pain for prolonged walking/ standing activities (05/12/2016)   Time 6   Period Weeks   Status New     PT LONG TERM GOAL #4   Title pt will be able to sit/ stand and walk >/= 45 min with no AD with </= 2/10 pain for functional endurance required or work and pt's personal goal of personal exercise (05/12/2016)   Time 6   Period Weeks   Status New     PT LONG TERM GOAL #5   Title she will increase her FOTO score to </= 46% limited to demontrate improvement at discharge (05/12/2016)   Time 6   Period Weeks   Status New               Plan - 04/03/16 1728    Clinical Impression Statement Improved AROM to 105  flexion. Knee extension less than 10 degrees. Focused on ROM with Nustep and Recumbent bike as well as mobilizations and stretching. Light manual to medial knee to decrease tenderness. Trial of Vasopneumatic device for soreness and edema.    PT Next Visit Plan assess/ review, knee mobs, AROM/ PROM, pre-gait quad activation, Bike for flexion, Vaso for swelling/ pain,    PT Home Exercise Plan heel slides with strap, clam shell, heel raises, quad set   Consulted and Agree with Plan of Care Patient       Patient will benefit from skilled therapeutic intervention in order to improve the following deficits and impairments:  Abnormal gait, Pain, Improper body mechanics, Increased edema, Decreased strength, Decreased range of motion, Increased fascial restricitons, Hypomobility, Decreased endurance, Decreased activity tolerance, Difficulty walking  Visit Diagnosis: Chronic pain of right knee  Stiffness of right knee, not elsewhere classified  Localized edema  Other abnormalities of gait and mobility  Muscle weakness (generalized)     Problem List Patient Active Problem List   Diagnosis Date Noted  . S/P total knee arthroplasty 03/12/2016  . Obesity (BMI 35.0-39.9 without comorbidity) 06/13/2015  . Elevated BP 06/06/2015  . Elevated LDL cholesterol level 08/17/2014  . Breast cancer of upper-inner quadrant of right female breast ([place]) 02/28/2014  . DJD (degenerative joint disease) of knee 06/29/2013  . Paroxysmal supraventricular tachycardia ([place]) 05/09/2013  . Fatigue 05/09/2013  . Screening for hypercholesterolemia 05/09/2013  . OSA (obstructive sleep apnea)   . Obesity (BMI 30-39.9)     Dorene Ar, [place] 04/03/2016, 5:31 PM  [place], [place], [address] Phone: [phone]   Fax:  [phone]  Name: Pamela Underwood MRN: [id number] Date of Birth: August 04, 1955

## 2016-04-07 ENCOUNTER — Ambulatory Visit: Payer: 59 | Admitting: Physical Therapy

## 2016-04-07 DIAGNOSIS — R2689 Other abnormalities of gait and mobility: Secondary | ICD-10-CM | POA: Diagnosis not present

## 2016-04-07 DIAGNOSIS — M25561 Pain in right knee: Secondary | ICD-10-CM | POA: Diagnosis not present

## 2016-04-07 DIAGNOSIS — R600 Localized edema: Secondary | ICD-10-CM | POA: Diagnosis not present

## 2016-04-07 DIAGNOSIS — M25661 Stiffness of right knee, not elsewhere classified: Secondary | ICD-10-CM

## 2016-04-07 DIAGNOSIS — M6281 Muscle weakness (generalized): Secondary | ICD-10-CM | POA: Diagnosis not present

## 2016-04-07 DIAGNOSIS — G8929 Other chronic pain: Secondary | ICD-10-CM | POA: Diagnosis not present

## 2016-04-07 NOTE — Therapy (Signed)
[place], [place], [address] Phone: [phone]   Fax:  [phone]  Physical Therapy Treatment  Patient Details  Name: Pamela Underwood MRN: [id number] Date of Birth: 10-03-1955 Referring Provider: Kathryne Hitch MD  Encounter Date: 04/07/2016      PT End of Session - 04/07/16 1146    Visit Number 3   Date for PT Re-Evaluation 05/12/16   Authorization Type [facility] UMR   PT Start Time [id number]   PT Stop Time 1245   PT Time Calculation (min) 58 min      Past Medical History:  Diagnosis Date  . Arthritis   . Atrial tachycardia ([place])    takes rythmol daily and cardiazem daily  . Back pain    occasionally  . Breast cancer ([place]) 02/22/14   right upper inner  . Chronic anxiety    takes Xanax daily as needed when heart rate going up  . Colitis    hx-one  . Dysrhythmia    atrial tachycardia- takes meds and controls it  . Elevated BP 06/06/2015  . Insomnia    takes Ambien nightly as needed  . Joint pain   . Joint swelling   . S/P radiation therapy 03/28/2014 through 04/27/2014                                03/28/2014 through 04/27/2014                                                                         Right breast 4250 cGy 17 sessions, right breast boost 1000 cGy in 4 sessions (hypofractionated)                         . Sleep apnea    uses a CPAP    Past Surgical History:  Procedure Laterality Date  . BREAST BIOPSY Left 05/02/1999   benign  . BREAST LUMPECTOMY WITH AXILLARY LYMPH NODE BIOPSY Right 02/22/14   upper inner  . COLONOSCOPY    . CYSTOSCOPY    . KNEE ARTHROSCOPY WITH MEDIAL MENISECTOMY Left 03/31/2013   Procedure: LEFT KNEE ARTHROSCOPY WITH PARTIAL MEDIAL MENISECTOMY, CHONDROPLASTY OF PATELLA  FEMORAL JOINT, EXCISION OF MEDIAL PLICA, PARTIAL LATERAL MENISECTOMY;  Surgeon: [name], MD;  Location: [facility];  Service: Orthopedics;  Laterality: Left;  . TOTAL KNEE  ARTHROPLASTY Left 06/29/2013   Procedure: TOTAL KNEE ARTHROPLASTY;  Surgeon: [name], MD;  Location: [facility];  Service: Orthopedics;  Laterality: Left;  . TOTAL KNEE ARTHROPLASTY Right 03/12/2016  . TOTAL KNEE ARTHROPLASTY Right 03/12/2016   Procedure: RIGHT TOTAL KNEE ARTHROPLASTY;  Surgeon: [name], MD;  Location: [facility];  Service: Orthopedics;  Laterality: Right;  . TUBAL LIGATION      There were no vitals filed for this visit.      Subjective Assessment - 04/07/16 1151    Subjective Increased pain at night. I have been exercising 4-5 times a day. I was sore after last visit.    Limitations Lifting;Standing;Walking;House hold activities   Currently in Pain? Yes   Pain Score 3  Pain Location Knee   Pain Orientation Right;Anterior;Medial;Lateral   Pain Descriptors / Indicators Throbbing            [facility] PT Assessment - 04/07/16 0001      AROM   Right Knee Extension -10   Right Knee Flexion 105     PROM   Right Knee Flexion 110                     [facility] Adult PT Treatment/Exercise - 04/07/16 0001      Ambulation/Gait   Pre-Gait Activities roll through heel /toe in parallel bars focusing on weight shift and quad activation   Gait Comments gait training woith 1 crutch focusing on heel strike and posture, education to increase weight bearing through RLE      Knee/Hip Exercises: Stretches   Active Hamstring Stretch Right;1 rep;60 seconds     Knee/Hip Exercises: Aerobic   Stationary Bike full revolutions x 5 minutes for ROM     Knee/Hip Exercises: Supine   Quad Sets 20 reps   Heel Slides 10 reps   Straight Leg Raises 15 reps     Vasopneumatic   Number Minutes Vasopneumatic  15 minutes   Vasopnuematic Location  Knee   Vasopneumatic Pressure Medium   Vasopneumatic Temperature  32                  PT Short Term Goals - 03/31/16 1754      PT SHORT TERM GOAL #1   Title pt will be I with inital HEP (04/21/2016)   Time 3    Period Weeks   Status New     PT SHORT TERM GOAL #2   Title pt will improve R knee flexion to >/= 105 degrees and extension to </= -6 degrees with </= 4/10 pain for functional progression (04/21/2016)   Time 3   Period Weeks   Status New     PT SHORT TERM GOAL #3   Title pt will be able to walk/ stand for >/= 15 min with no AD with </= 4/10 pain to assist with unaided ambulation to promote funcitonal mobility (04/21/2016)   Time 3   Period Weeks   Status New           PT Long Term Goals - 03/31/16 1757      PT LONG TERM GOAL #1   Title she will be I with all HEP given as of last visit (05/12/2016)   Time 6   Period Weeks   Status New     PT LONG TERM GOAL #2   Title pt will improve her R knee flexion to >/= 120 degrees and extension to </= -4 degrees  with </= 2/10 pain for functional and efficient gait pattern (05/12/2016)   Time 6   Period Weeks   Status New     PT LONG TERM GOAL #3   Title she will increase her RLE strength to >/= 4+/5 with </= 2/10 pain for prolonged walking/ standing activities (05/12/2016)   Time 6   Period Weeks   Status New     PT LONG TERM GOAL #4   Title pt will be able to sit/ stand and walk >/= 45 min with no AD with </= 2/10 pain for functional endurance required or work and pt's personal goal of personal exercise (05/12/2016)   Time 6   Period Weeks   Status New     PT LONG TERM GOAL #5  Title she will increase her FOTO score to </= 46% limited to demontrate improvement at discharge (05/12/2016)   Time 6   Period Weeks   Status New               Plan - 04/07/16 1254    Clinical Impression Statement Pt enters with bilateral crutches. Discussed weight bearing as tolerated and worked on gait and quad activation in parallel bars. Increased soreness and pain post closed chain exercises. Review of mat exercises with pt reporting pain. Vaso at end of session to calm down soreness and pain.    PT Next Visit Plan assess/ review,  knee mobs, AROM/ PROM, pre-gait quad activation, Bike for flexion, Vaso for swelling/ pain,    PT Home Exercise Plan heel slides with strap, clam shell, heel raises, quad set   Consulted and Agree with Plan of Care Patient      Patient will benefit from skilled therapeutic intervention in order to improve the following deficits and impairments:  Abnormal gait, Pain, Improper body mechanics, Increased edema, Decreased strength, Decreased range of motion, Increased fascial restricitons, Hypomobility, Decreased endurance, Decreased activity tolerance, Difficulty walking  Visit Diagnosis: Chronic pain of right knee  Stiffness of right knee, not elsewhere classified  Localized edema  Other abnormalities of gait and mobility  Muscle weakness (generalized)     Problem List Patient Active Problem List   Diagnosis Date Noted  . S/P total knee arthroplasty 03/12/2016  . Obesity (BMI 35.0-39.9 without comorbidity) 06/13/2015  . Elevated BP 06/06/2015  . Elevated LDL cholesterol level 08/17/2014  . Breast cancer of upper-inner quadrant of right female breast ([place]) 02/28/2014  . DJD (degenerative joint disease) of knee 06/29/2013  . Paroxysmal supraventricular tachycardia ([place]) 05/09/2013  . Fatigue 05/09/2013  . Screening for hypercholesterolemia 05/09/2013  . OSA (obstructive sleep apnea)   . Obesity (BMI 30-39.9)     Dorene Ar, [place] 04/07/2016, 1:03 PM  [place], [place], [address] Phone: [phone]   Fax:  [phone]  Name: Pamela Underwood MRN: [id number] Date of Birth: Oct 19, 1955

## 2016-04-10 ENCOUNTER — Ambulatory Visit: Payer: 59 | Admitting: Physical Therapy

## 2016-04-10 DIAGNOSIS — M25561 Pain in right knee: Secondary | ICD-10-CM | POA: Diagnosis not present

## 2016-04-10 DIAGNOSIS — G8929 Other chronic pain: Secondary | ICD-10-CM | POA: Diagnosis not present

## 2016-04-10 DIAGNOSIS — M25661 Stiffness of right knee, not elsewhere classified: Secondary | ICD-10-CM

## 2016-04-10 DIAGNOSIS — R2689 Other abnormalities of gait and mobility: Secondary | ICD-10-CM

## 2016-04-10 DIAGNOSIS — R600 Localized edema: Secondary | ICD-10-CM | POA: Diagnosis not present

## 2016-04-10 DIAGNOSIS — M6281 Muscle weakness (generalized): Secondary | ICD-10-CM

## 2016-04-10 NOTE — Therapy (Signed)
[place], [place], [address] Phone: [phone]   Fax:  [phone]  Physical Therapy Treatment  Patient Details  Name: Pamela Underwood MRN: [id number] Date of Birth: [birth year] Referring Provider: Kathryne Hitch MD  Encounter Date: 04/10/2016      PT End of Session - 04/10/16 1259    Visit Number 4   Number of Visits 13   Date for PT Re-Evaluation 05/12/16   PT Start Time 1200  pt arrived 15 minutes late   PT Stop Time 1238   PT Time Calculation (min) 38 min   Activity Tolerance Patient tolerated treatment well   Behavior During Therapy [facility] for tasks assessed/performed      Past Medical History:  Diagnosis Date  . Arthritis   . Atrial tachycardia ([place])    takes rythmol daily and cardiazem daily  . Back pain    occasionally  . Breast cancer ([place]) 02/22/14   right upper inner  . Chronic anxiety    takes Xanax daily as needed when heart rate going up  . Colitis    hx-one  . Dysrhythmia    atrial tachycardia- takes meds and controls it  . Elevated BP 06/06/2015  . Insomnia    takes Ambien nightly as needed  . Joint pain   . Joint swelling   . S/P radiation therapy 03/28/2014 through 04/27/2014                                03/28/2014 through 04/27/2014                                                                         Right breast 4250 cGy 17 sessions, right breast boost 1000 cGy in 4 sessions (hypofractionated)                         . Sleep apnea    uses a CPAP    Past Surgical History:  Procedure Laterality Date  . BREAST BIOPSY Left 05/02/1999   benign  . BREAST LUMPECTOMY WITH AXILLARY LYMPH NODE BIOPSY Right 02/22/14   upper inner  . COLONOSCOPY    . CYSTOSCOPY    . KNEE ARTHROSCOPY WITH MEDIAL MENISECTOMY Left 03/31/2013   Procedure: LEFT KNEE ARTHROSCOPY WITH PARTIAL MEDIAL MENISECTOMY, CHONDROPLASTY OF PATELLA  FEMORAL JOINT, EXCISION OF MEDIAL PLICA, PARTIAL LATERAL  MENISECTOMY;  Surgeon: [name], MD;  Location: [facility];  Service: Orthopedics;  Laterality: Left;  . TOTAL KNEE ARTHROPLASTY Left 06/29/2013   Procedure: TOTAL KNEE ARTHROPLASTY;  Surgeon: [name], MD;  Location: [facility];  Service: Orthopedics;  Laterality: Left;  . TOTAL KNEE ARTHROPLASTY Right 03/12/2016  . TOTAL KNEE ARTHROPLASTY Right 03/12/2016   Procedure: RIGHT TOTAL KNEE ARTHROPLASTY;  Surgeon: [name], MD;  Location: [facility];  Service: Orthopedics;  Laterality: Right;  . TUBAL LIGATION      There were no vitals filed for this visit.      Subjective Assessment - 04/10/16 1159    Subjective "I am still having tightness at night which seems to be the worse at  7/10, and but mostly it is 3/10 during the day"   Currently in Pain? Yes   Pain Score 3    Pain Orientation Right   Pain Descriptors / Indicators Aching   Pain Type Acute pain;Surgical pain   Pain Onset More than a month ago   Pain Frequency Constant   Aggravating Factors  laying down at night,    Pain Relieving Factors prolonged standing/ walking sitting,             [facility] PT Assessment - 04/10/16 0001      AROM   Right Knee Extension -10   Right Knee Flexion 108                     [facility] Adult PT Treatment/Exercise - 04/10/16 1206      Knee/Hip Exercises: Stretches   Active Hamstring Stretch 3 reps;30 seconds  contract/ relax with 10 sec contraction   Quad Stretch 3 reps;30 seconds  contract/ relax with 10 sec hold     Knee/Hip Exercises: Aerobic   Stationary Bike Bike x 6 min full revolutions   lower seat every 2 min     Knee/Hip Exercises: Supine   Quad Sets 20 reps     Vasopneumatic   Number Minutes Vasopneumatic  10 minutes   Vasopnuematic Location  Knee   Vasopneumatic Pressure Medium   Vasopneumatic Temperature  32     Manual Therapy   Joint Mobilization A/P mobs and P/A mobs grade II  , patella mobs grade 2 inferior/ superior                   PT Short Term Goals - 03/31/16 1754      PT SHORT TERM GOAL #1   Title pt will be I with inital HEP (04/21/2016)   Time 3   Period Weeks   Status New     PT SHORT TERM GOAL #2   Title pt will improve R knee flexion to >/= 105 degrees and extension to </= -6 degrees with </= 4/10 pain for functional progression (04/21/2016)   Time 3   Period Weeks   Status New     PT SHORT TERM GOAL #3   Title pt will be able to walk/ stand for >/= 15 min with no AD with </= 4/10 pain to assist with unaided ambulation to promote funcitonal mobility (04/21/2016)   Time 3   Period Weeks   Status New           PT Long Term Goals - 03/31/16 1757      PT LONG TERM GOAL #1   Title she will be I with all HEP given as of last visit (05/12/2016)   Time 6   Period Weeks   Status New     PT LONG TERM GOAL #2   Title pt will improve her R knee flexion to >/= 120 degrees and extension to </= -4 degrees  with </= 2/10 pain for functional and efficient gait pattern (05/12/2016)   Time 6   Period Weeks   Status New     PT LONG TERM GOAL #3   Title she will increase her RLE strength to >/= 4+/5 with </= 2/10 pain for prolonged walking/ standing activities (05/12/2016)   Time 6   Period Weeks   Status New     PT LONG TERM GOAL #4   Title pt will be able to sit/ stand and walk >/= 45 min with  no AD with </= 2/10 pain for functional endurance required or work and pt's personal goal of personal exercise (05/12/2016)   Time 6   Period Weeks   Status New     PT LONG TERM GOAL #5   Title she will increase her FOTO score to </= 46% limited to demontrate improvement at discharge (05/12/2016)   Time 6   Period Weeks   Status New               Plan - 04/10/16 1259    Clinical Impression Statement pt arrived 15 minutes late today. focused on stretching and manual to improve knee mobility due to limited time. continued vaso post session for soreness and pain.        Patient will benefit from skilled therapeutic intervention in order to improve the following deficits and impairments:     Visit Diagnosis: Chronic pain of right knee  Stiffness of right knee, not elsewhere classified  Localized edema  Other abnormalities of gait and mobility  Muscle weakness (generalized)     Problem List Patient Active Problem List   Diagnosis Date Noted  . S/P total knee arthroplasty 03/12/2016  . Obesity (BMI 35.0-39.9 without comorbidity) 06/13/2015  . Elevated BP 06/06/2015  . Elevated LDL cholesterol level 08/17/2014  . Breast cancer of upper-inner quadrant of right female breast ([place]) 02/28/2014  . DJD (degenerative joint disease) of knee 06/29/2013  . Paroxysmal supraventricular tachycardia ([place]) 05/09/2013  . Fatigue 05/09/2013  . Screening for hypercholesterolemia 05/09/2013  . OSA (obstructive sleep apnea)   . Obesity (BMI 30-39.9)    Starr Lake PT, DPT, LAT, ATC  04/10/16  1:02 PM      [facility] [address] [place], [place], [address] Phone: [phone]   Fax:  [phone]  Name: Pamela Underwood MRN: [id number] Date of Birth: 1955/09/04

## 2016-04-11 MED FILL — HYDROCODON-APAP 5-325: 5-325 | 10 days supply | Qty: 60 | Fill #0

## 2016-04-14 ENCOUNTER — Ambulatory Visit: Payer: 59 | Admitting: Physical Therapy

## 2016-04-14 DIAGNOSIS — M25561 Pain in right knee: Secondary | ICD-10-CM | POA: Diagnosis not present

## 2016-04-14 DIAGNOSIS — R2689 Other abnormalities of gait and mobility: Secondary | ICD-10-CM | POA: Diagnosis not present

## 2016-04-14 DIAGNOSIS — G8929 Other chronic pain: Secondary | ICD-10-CM

## 2016-04-14 DIAGNOSIS — M25661 Stiffness of right knee, not elsewhere classified: Secondary | ICD-10-CM

## 2016-04-14 DIAGNOSIS — M6281 Muscle weakness (generalized): Secondary | ICD-10-CM

## 2016-04-14 DIAGNOSIS — R600 Localized edema: Secondary | ICD-10-CM | POA: Diagnosis not present

## 2016-04-14 NOTE — Therapy (Signed)
[place], [place], [address] Phone: [phone]   Fax:  [phone]  Physical Therapy Treatment  Patient Details  Name: Pamela Underwood MRN: [id number] Date of Birth: [birth year] Referring Provider: Kathryne Hitch MD  Encounter Date: 04/14/2016      PT End of Session - 04/14/16 1425    Visit Number 5   Number of Visits 13   Date for PT Re-Evaluation 05/12/16   Authorization Type [facility] UMR   PT Start Time 1330   PT Stop Time 1423   PT Time Calculation (min) 53 min   Activity Tolerance Patient tolerated treatment well   Behavior During Therapy [facility] for tasks assessed/performed      Past Medical History:  Diagnosis Date  . Arthritis   . Atrial tachycardia ([place])    takes rythmol daily and cardiazem daily  . Back pain    occasionally  . Breast cancer ([place]) 02/22/14   right upper inner  . Chronic anxiety    takes Xanax daily as needed when heart rate going up  . Colitis    hx-one  . Dysrhythmia    atrial tachycardia- takes meds and controls it  . Elevated BP 06/06/2015  . Insomnia    takes Ambien nightly as needed  . Joint pain   . Joint swelling   . S/P radiation therapy 03/28/2014 through 04/27/2014                                03/28/2014 through 04/27/2014                                                                         Right breast 4250 cGy 17 sessions, right breast boost 1000 cGy in 4 sessions (hypofractionated)                         . Sleep apnea    uses a CPAP    Past Surgical History:  Procedure Laterality Date  . BREAST BIOPSY Left 05/02/1999   benign  . BREAST LUMPECTOMY WITH AXILLARY LYMPH NODE BIOPSY Right 02/22/14   upper inner  . COLONOSCOPY    . CYSTOSCOPY    . KNEE ARTHROSCOPY WITH MEDIAL MENISECTOMY Left 03/31/2013   Procedure: LEFT KNEE ARTHROSCOPY WITH PARTIAL MEDIAL MENISECTOMY, CHONDROPLASTY OF PATELLA  FEMORAL JOINT, EXCISION OF MEDIAL PLICA, PARTIAL LATERAL  MENISECTOMY;  Surgeon: [name], MD;  Location: [facility];  Service: Orthopedics;  Laterality: Left;  . TOTAL KNEE ARTHROPLASTY Left 06/29/2013   Procedure: TOTAL KNEE ARTHROPLASTY;  Surgeon: [name], MD;  Location: [facility];  Service: Orthopedics;  Laterality: Left;  . TOTAL KNEE ARTHROPLASTY Right 03/12/2016  . TOTAL KNEE ARTHROPLASTY Right 03/12/2016   Procedure: RIGHT TOTAL KNEE ARTHROPLASTY;  Surgeon: [name], MD;  Location: [facility];  Service: Orthopedics;  Laterality: Right;  . TUBAL LIGATION      There were no vitals filed for this visit.      Subjective Assessment - 04/14/16 1328    Subjective "I didn't sleep well last night but overall im not doing to bad"  Currently in Pain? Yes   Pain Score 3    Pain Location Knee   Pain Orientation Right   Pain Descriptors / Indicators Aching   Pain Type Surgical pain   Pain Frequency Constant                         [facility] Adult PT Treatment/Exercise - 04/14/16 0001      Knee/Hip Exercises: Aerobic   Stationary Bike Bike x 8 min full revolutions   lowering seat every 2 min     Knee/Hip Exercises: Standing   Gait Training 4 x 30 ft exaggerated heel - toe gait bil to facilitate efficient gait pattern     Knee/Hip Exercises: Supine   Quad Sets 20 reps  with feet on bolster.  also performed during IASTM     Vasopneumatic   Number Minutes Vasopneumatic  10 minutes   Vasopnuematic Location  Knee   Vasopneumatic Pressure Medium   Vasopneumatic Temperature  32     Manual Therapy   Manual therapy comments cross friction massage over the incision, manual trigger point release along distal VMO x 2   Joint Mobilization A/P mobs and P/A mobs grade II  , patella mobs grade 2 inferior/ superior   Soft tissue mobilization IASTM over the medial knee and distal quads                PT Education - 04/14/16 1424    Education provided Yes   Education Details benefits of walking heel  to toe with knee mobility, as well as energy efficiency benefits to practice at home when using the crutch as well as without the crutch. how to perform manual trigger point release to decrease muscle tension.    Person(s) Educated Patient   Methods Explanation;Verbal cues   Comprehension Verbalized understanding;Verbal cues required          PT Short Term Goals - 04/14/16 1428      PT SHORT TERM GOAL #1   Title pt will be I with inital HEP (04/21/2016)   Time 3   Period Weeks   Status Achieved           PT Long Term Goals - 03/31/16 1757      PT LONG TERM GOAL #1   Title she will be I with all HEP given as of last visit (05/12/2016)   Time 6   Period Weeks   Status New     PT LONG TERM GOAL #2   Title pt will improve her R knee flexion to >/= 120 degrees and extension to </= -4 degrees  with </= 2/10 pain for functional and efficient gait pattern (05/12/2016)   Time 6   Period Weeks   Status New     PT LONG TERM GOAL #3   Title she will increase her RLE strength to >/= 4+/5 with </= 2/10 pain for prolonged walking/ standing activities (05/12/2016)   Time 6   Period Weeks   Status New     PT LONG TERM GOAL #4   Title pt will be able to sit/ stand and walk >/= 45 min with no AD with </= 2/10 pain for functional endurance required or work and pt's personal goal of personal exercise (05/12/2016)   Time 6   Period Weeks   Status New     PT LONG TERM GOAL #5   Title she will increase her FOTO score to </= 46% limited to  demontrate improvement at discharge (05/12/2016)   Time 6   Period Weeks   Status New               Plan - 04/14/16 1425    Clinical Impression Statement Continue to work on knee mobility and incision mobility via cross friction massage techniques. gait training with focus on exaggerated heel strike and toe off to facilitate normal efficient gait pattern. She performed treatment well, continued VASO for pain and swelling.  She met STG #1  today.    PT Next Visit Plan  knee mobs, AROM/ PROM, pre-gait quad activation, Bike for flexion, Vaso for swelling/ pain, [place] if poor quad activation?   PT Home Exercise Plan heel slides with strap, clam shell, heel raises, quad set, gait with exaggerated heel strike and toe off   Consulted and Agree with Plan of Care Patient      Patient will benefit from skilled therapeutic intervention in order to improve the following deficits and impairments:  Abnormal gait, Pain, Improper body mechanics, Increased edema, Decreased strength, Decreased range of motion, Increased fascial restricitons, Hypomobility, Decreased endurance, Decreased activity tolerance, Difficulty walking  Visit Diagnosis: Chronic pain of right knee  Stiffness of right knee, not elsewhere classified  Localized edema  Other abnormalities of gait and mobility  Muscle weakness (generalized)     Problem List Patient Active Problem List   Diagnosis Date Noted  . S/P total knee arthroplasty 03/12/2016  . Obesity (BMI 35.0-39.9 without comorbidity) 06/13/2015  . Elevated BP 06/06/2015  . Elevated LDL cholesterol level 08/17/2014  . Breast cancer of upper-inner quadrant of right female breast ([place]) 02/28/2014  . DJD (degenerative joint disease) of knee 06/29/2013  . Paroxysmal supraventricular tachycardia ([place]) 05/09/2013  . Fatigue 05/09/2013  . Screening for hypercholesterolemia 05/09/2013  . OSA (obstructive sleep apnea)   . Obesity (BMI 30-39.9)    Starr Lake PT, DPT, LAT, ATC  04/14/16  2:29 PM      [facility] [address] [place], [place], [address] Phone: [phone]   Fax:  [phone]  Name: KAYDEE MAGEL MRN: [id number] Date of Birth: [birth year]

## 2016-04-15 ENCOUNTER — Ambulatory Visit: Payer: 59 | Admitting: Physical Therapy

## 2016-04-16 ENCOUNTER — Ambulatory Visit: Payer: 59 | Admitting: Physical Therapy

## 2016-04-16 DIAGNOSIS — R2689 Other abnormalities of gait and mobility: Secondary | ICD-10-CM | POA: Diagnosis not present

## 2016-04-16 DIAGNOSIS — G8929 Other chronic pain: Secondary | ICD-10-CM | POA: Diagnosis not present

## 2016-04-16 DIAGNOSIS — M25661 Stiffness of right knee, not elsewhere classified: Secondary | ICD-10-CM

## 2016-04-16 DIAGNOSIS — M6281 Muscle weakness (generalized): Secondary | ICD-10-CM

## 2016-04-16 DIAGNOSIS — R600 Localized edema: Secondary | ICD-10-CM | POA: Diagnosis not present

## 2016-04-16 DIAGNOSIS — M25561 Pain in right knee: Secondary | ICD-10-CM | POA: Diagnosis not present

## 2016-04-16 NOTE — Therapy (Signed)
[place], [place], [address] Phone: [phone]   Fax:  [phone]  Physical Therapy Treatment  Patient Details  Name: Pamela Underwood MRN: [id number] Date of Birth: 03/29/1956 Referring Provider: Kathryne Hitch MD  Encounter Date: 04/16/2016      PT End of Session - 04/16/16 1636    Visit Number 6   Number of Visits 13   Date for PT Re-Evaluation 05/12/16   PT Start Time 7510   PT Stop Time 1635   PT Time Calculation (min) 55 min   Behavior During Therapy [facility] for tasks assessed/performed      Past Medical History:  Diagnosis Date  . Arthritis   . Atrial tachycardia ([place])    takes rythmol daily and cardiazem daily  . Back pain    occasionally  . Breast cancer ([place]) 02/22/14   right upper inner  . Chronic anxiety    takes Xanax daily as needed when heart rate going up  . Colitis    hx-one  . Dysrhythmia    atrial tachycardia- takes meds and controls it  . Elevated BP 06/06/2015  . Insomnia    takes Ambien nightly as needed  . Joint pain   . Joint swelling   . S/P radiation therapy 03/28/2014 through 04/27/2014                                03/28/2014 through 04/27/2014                                                                         Right breast 4250 cGy 17 sessions, right breast boost 1000 cGy in 4 sessions (hypofractionated)                         . Sleep apnea    uses a CPAP    Past Surgical History:  Procedure Laterality Date  . BREAST BIOPSY Left 05/02/1999   benign  . BREAST LUMPECTOMY WITH AXILLARY LYMPH NODE BIOPSY Right 02/22/14   upper inner  . COLONOSCOPY    . CYSTOSCOPY    . KNEE ARTHROSCOPY WITH MEDIAL MENISECTOMY Left 03/31/2013   Procedure: LEFT KNEE ARTHROSCOPY WITH PARTIAL MEDIAL MENISECTOMY, CHONDROPLASTY OF PATELLA  FEMORAL JOINT, EXCISION OF MEDIAL PLICA, PARTIAL LATERAL MENISECTOMY;  Surgeon: [name], MD;  Location: [place];   Service: Orthopedics;  Laterality: Left;  . TOTAL KNEE ARTHROPLASTY Left 06/29/2013   Procedure: TOTAL KNEE ARTHROPLASTY;  Surgeon: [name], MD;  Location: [place];  Service: Orthopedics;  Laterality: Left;  . TOTAL KNEE ARTHROPLASTY Right 03/12/2016  . TOTAL KNEE ARTHROPLASTY Right 03/12/2016   Procedure: RIGHT TOTAL KNEE ARTHROPLASTY;  Surgeon: [name], MD;  Location: [name];  Service: Orthopedics;  Laterality: Right;  . TUBAL LIGATION      There were no vitals filed for this visit.      Subjective Assessment - 04/16/16 1552    Subjective 5 weeks post OP.  Already did her exercises this morning.    Currently in Pain? Yes   Pain Score 5    Pain Location  Knee   Pain Orientation Right   Pain Descriptors / Indicators Aching   Pain Type Surgical pain   Pain Frequency Constant   Aggravating Factors  laying down at night,  up on leg too long.   Pain Relieving Factors Stop and use ICE   Multiple Pain Sites No            [facility] PT Assessment - 04/16/16 0001      PROM   Right Knee Flexion 105  it is later in he day and she has done too much                     [facility] Adult PT Treatment/Exercise - 04/16/16 0001      Knee/Hip Exercises: Stretches   Other Knee/Hip Stretches Standing step stretch 10 x 5 seconds     Knee/Hip Exercises: Aerobic   Recumbent Bike 5 minutes     Knee/Hip Exercises: Standing   Lateral Step Up 1 set  10 X 4 inches, 2 hands   Forward Step Up 1 set;10 reps;Hand Hold: 2;Step Height: 6"   SLS with Vectors flexion and abduction pillowcase slides for balance, colse SBA.   Gait Training 100 feet minor cues for trunk rotations.    Other Standing Knee Exercises facing wall wall slides with weight shift onto right, and with rt arm/left knee lifts to simulate gait.     Knee/Hip Exercises: Supine   Quad Sets 20 reps  1 set on bolster, 1 set with 3" foam roller on mat   Heel Slides 1 set;10 reps  with strap     Vasopneumatic   Number  Minutes Vasopneumatic  10 minutes   Vasopnuematic Location  Knee   Vasopneumatic Pressure Medium   Vasopneumatic Temperature  32  Leg elevated     Manual Therapy   Manual therapy comments retrograde soft tissue work thigh, patell and scar mobilization,  tissue softer and more moblie.                    PT Short Term Goals - 04/14/16 1428      PT SHORT TERM GOAL #1   Title pt will be I with inital HEP (04/21/2016)   Time 3   Period Weeks   Status Achieved           PT Long Term Goals - 03/31/16 1757      PT LONG TERM GOAL #1   Title she will be I with all HEP given as of last visit (05/12/2016)   Time 6   Period Weeks   Status New     PT LONG TERM GOAL #2   Title pt will improve her R knee flexion to >/= 120 degrees and extension to </= -4 degrees  with </= 2/10 pain for functional and efficient gait pattern (05/12/2016)   Time 6   Period Weeks   Status New     PT LONG TERM GOAL #3   Title she will increase her RLE strength to >/= 4+/5 with </= 2/10 pain for prolonged walking/ standing activities (05/12/2016)   Time 6   Period Weeks   Status New     PT LONG TERM GOAL #4   Title pt will be able to sit/ stand and walk >/= 45 min with no AD with </= 2/10 pain for functional endurance required or work and pt's personal goal of personal exercise (05/12/2016)   Time 6   Period Weeks  Status New     PT LONG TERM GOAL #5   Title she will increase her FOTO score to </= 46% limited to demontrate improvement at discharge (05/12/2016)   Time 6   Period Weeks   Status New               Plan - 04/16/16 1637    Clinical Impression Statement pain decreased to 3/10 after session.  Pain increased to 6-7/10 with step ups.  105 AAROM prior to exercise knee flexion.  Late day appointment after a long day getting ready for thanksgiving patient was fatigued.   No new goals met. Quad contraction was decent today.   PT Next Visit Plan  knee mobs, AROM/ PROM,  pre-gait quad activation, Bike for flexion, Vaso for swelling/ pain,  Continue step ups, try wall slides.  More stretching.   PT Home Exercise Plan heel slides with strap, clam shell, heel raises, quad set, gait with exaggerated heel strike and toe off   Consulted and Agree with Plan of Care Patient      Patient will benefit from skilled therapeutic intervention in order to improve the following deficits and impairments:  Abnormal gait, Pain, Improper body mechanics, Increased edema, Decreased strength, Decreased range of motion, Increased fascial restricitons, Hypomobility, Decreased endurance, Decreased activity tolerance, Difficulty walking  Visit Diagnosis: Chronic pain of right knee  Stiffness of right knee, not elsewhere classified  Localized edema  Other abnormalities of gait and mobility  Muscle weakness (generalized)     Problem List Patient Active Problem List   Diagnosis Date Noted  . S/P total knee arthroplasty 03/12/2016  . Obesity (BMI 35.0-39.9 without comorbidity) 06/13/2015  . Elevated BP 06/06/2015  . Elevated LDL cholesterol level 08/17/2014  . Breast cancer of upper-inner quadrant of right female breast ([place]) 02/28/2014  . DJD (degenerative joint disease) of knee 06/29/2013  . Paroxysmal supraventricular tachycardia ([place]) 05/09/2013  . Fatigue 05/09/2013  . Screening for hypercholesterolemia 05/09/2013  . OSA (obstructive sleep apnea)   . Obesity (BMI 30-39.9)     Liylah Najarro PTA 04/16/2016, 4:41 PM  [facility] [address] [place], [place], [address] Phone: [phone]   Fax:  [phone]  Name: Pamela Underwood MRN: [id number] Date of Birth: 1955-06-12

## 2016-04-21 ENCOUNTER — Ambulatory Visit: Payer: 59 | Admitting: Physical Therapy

## 2016-04-21 DIAGNOSIS — M25561 Pain in right knee: Secondary | ICD-10-CM | POA: Diagnosis not present

## 2016-04-21 DIAGNOSIS — R2689 Other abnormalities of gait and mobility: Secondary | ICD-10-CM

## 2016-04-21 DIAGNOSIS — G8929 Other chronic pain: Secondary | ICD-10-CM

## 2016-04-21 DIAGNOSIS — R600 Localized edema: Secondary | ICD-10-CM

## 2016-04-21 DIAGNOSIS — M6281 Muscle weakness (generalized): Secondary | ICD-10-CM | POA: Diagnosis not present

## 2016-04-21 DIAGNOSIS — M25661 Stiffness of right knee, not elsewhere classified: Secondary | ICD-10-CM

## 2016-04-21 MED FILL — HYDROCODON-APAP 5-325: 5-325 | 10 days supply | Qty: 60 | Fill #0

## 2016-04-21 NOTE — Therapy (Signed)
[place], [place], [address] Phone: [phone]   Fax:  [phone]  Physical Therapy Treatment  Patient Details  Name: Pamela Underwood MRN: [id number] Date of Birth: 1956-01-14 Referring Provider: Kathryne Hitch MD  Encounter Date: 04/21/2016      PT End of Session - 04/21/16 1204    Visit Number 7   Number of Visits 13   Date for PT Re-Evaluation 05/12/16   Authorization Type [facility] U[facility]   PT Start Time 1150   PT Stop Time 1240   PT Time Calculation (min) 50 min      Past Medical History:  Diagnosis Date  . Arthritis   . Atrial tachycardia ([place])    takes rythmol daily and cardiazem daily  . Back pain    occasionally  . Breast cancer ([place]) 02/22/14   right upper inner  . Chronic anxiety    takes Xanax daily as needed when heart rate going up  . Colitis    hx-one  . Dysrhythmia    atrial tachycardia- takes meds and controls it  . Elevated BP 06/06/2015  . Insomnia    takes Ambien nightly as needed  . Joint pain   . Joint swelling   . S/P radiation therapy 03/28/2014 through 04/27/2014                                03/28/2014 through 04/27/2014                                                                         Right breast 4250 cGy 17 sessions, right breast boost 1000 cGy in 4 sessions (hypofractionated)                         . Sleep apnea    uses a CPAP    Past Surgical History:  Procedure Laterality Date  . BREAST BIOPSY Left 05/02/1999   benign  . BREAST LUMPECTOMY WITH AXILLARY LYMPH NODE BIOPSY Right 02/22/14   upper inner  . COLONOSCOPY    . CYSTOSCOPY    . KNEE ARTHROSCOPY WITH MEDIAL MENISECTOMY Left 03/31/2013   Procedure: LEFT KNEE ARTHROSCOPY WITH PARTIAL MEDIAL MENISECTOMY, CHONDROPLASTY OF PATELLA  FEMORAL JOINT, EXCISION OF MEDIAL PLICA, PARTIAL LATERAL MENISECTOMY;  Surgeon: [name], MD;  Location: [place];  Service: Orthopedics;  Laterality:  Left;  . TOTAL KNEE ARTHROPLASTY Left 06/29/2013   Procedure: TOTAL KNEE ARTHROPLASTY;  Surgeon: [name], MD;  Location: [place];  Service: Orthopedics;  Laterality: Left;  . TOTAL KNEE ARTHROPLASTY Right 03/12/2016  . TOTAL KNEE ARTHROPLASTY Right 03/12/2016   Procedure: RIGHT TOTAL KNEE ARTHROPLASTY;  Surgeon: [name], MD;  Location: [place];  Service: Orthopedics;  Laterality: Right;  . TUBAL LIGATION      There were no vitals filed for this visit.      Subjective Assessment - 04/21/16 1201    Subjective I had alot of pain yesterday.,    Currently in Pain? Yes   Pain Score 2    Pain Location Knee   Pain Orientation Right  Pain Descriptors / Indicators Aching;Throbbing   Aggravating Factors  on feet too long    Pain Relieving Factors rest, ice                          [facility] Adult PT Treatment/Exercise - 04/21/16 0001      Knee/Hip Exercises: Aerobic   Recumbent Bike 6 minutes level 1     Knee/Hip Exercises: Standing   Lateral Step Up 15 reps;Step Height: 6"   Forward Step Up 15 reps;Step Height: 6"   Wall Squat 10 reps   SLS 25 sec best x 2    Other Standing Knee Exercises facing wall wall slides with weight shift onto right, and with rt arm/left knee lifts to simulate gait.     Knee/Hip Exercises: Seated   Other Seated Knee/Hip Exercises quad sets with foot on floor      Knee/Hip Exercises: Supine   Quad Sets Limitations 5 minutes during [place] stim   Short Arc [employer] Limitations 7 minutes during [place] stim    Straight Leg Raises Limitations 3 minutes during [place] stim     Modalities   Modalities Cryotherapy;Electrical Stimulation     Cryotherapy   Number Minutes Cryotherapy 5 Minutes   Cryotherapy Location Knee   Type of Cryotherapy Ice pack     Acupuncturist Location Right Financial risk analyst Parameters 10/10   Electrical Stimulation  Goals Strength                  PT Short Term Goals - 04/21/16 1228      PT SHORT TERM GOAL #1   Title pt will be I with inital HEP (04/21/2016)   Time 3   Status Achieved     PT SHORT TERM GOAL #2   Title pt will improve R knee flexion to >/= 105 degrees and extension to </= -6 degrees with </= 4/10 pain for functional progression (04/21/2016)   Time 3   Period Weeks   Status Unable to assess     PT SHORT TERM GOAL #3   Title pt will be able to walk/ stand for >/= 15 min with no AD with </= 4/10 pain to assist with unaided ambulation to promote funcitonal mobility (04/21/2016)   Baseline unable to stand/walk for 15 minutes, knee feels heavy    Time 3   Period Weeks   Status On-going           PT Long Term Goals - 03/31/16 1757      PT LONG TERM GOAL #1   Title she will be I with all HEP given as of last visit (05/12/2016)   Time 6   Period Weeks   Status New     PT LONG TERM GOAL #2   Title pt will improve her R knee flexion to >/= 120 degrees and extension to </= -4 degrees  with </= 2/10 pain for functional and efficient gait pattern (05/12/2016)   Time 6   Period Weeks   Status New     PT LONG TERM GOAL #3   Title she will increase her RLE strength to >/= 4+/5 with </= 2/10 pain for prolonged walking/ standing activities (05/12/2016)   Time 6   Period Weeks   Status New     PT LONG TERM GOAL #4   Title pt will be able to sit/ stand  and walk >/= 45 min with no AD with </= 2/10 pain for functional endurance required or work and pt's personal goal of personal exercise (05/12/2016)   Time 6   Period Weeks   Status New     PT LONG TERM GOAL #5   Title she will increase her FOTO score to </= 46% limited to demontrate improvement at discharge (05/12/2016)   Time 6   Period Weeks   Status New               Plan - 04/21/16 1222    Clinical Impression Statement Pt continues with poor voluntary quad contraction and medial knee pain. Trial of  [place] stim used to activate quad with fair contraction attained. Began wall slides and SLS with pt able to perfrom SLS 25 seconds until c/o leg feeling Heavy. She is climbing stairs with alternating pattern. She uses step to gait with descending stairs. Pt uses one crutch outside of home and no AD in home.    PT Next Visit Plan continue [place];  knee mobs, AROM/ PROM, pre-gait quad activation, Bike for flexion, Vaso for swelling/ pain,  Continue step ups, continue  wall slides.  More stretching.SLS exercises    PT Home Exercise Plan heel slides with strap, clam shell, heel raises, quad set, gait with exaggerated heel strike and toe off      Patient will benefit from skilled therapeutic intervention in order to improve the following deficits and impairments:  Abnormal gait, Pain, Improper body mechanics, Increased edema, Decreased strength, Decreased range of motion, Increased fascial restricitons, Hypomobility, Decreased endurance, Decreased activity tolerance, Difficulty walking  Visit Diagnosis: Chronic pain of right knee  Stiffness of right knee, not elsewhere classified  Localized edema  Other abnormalities of gait and mobility  Muscle weakness (generalized)     Problem List Patient Active Problem List   Diagnosis Date Noted  . S/P total knee arthroplasty 03/12/2016  . Obesity (BMI 35.0-39.9 without comorbidity) 06/13/2015  . Elevated BP 06/06/2015  . Elevated LDL cholesterol level 08/17/2014  . Breast cancer of upper-inner quadrant of right female breast ([place]) 02/28/2014  . DJD (degenerative joint disease) of knee 06/29/2013  . Paroxysmal supraventricular tachycardia ([place]) 05/09/2013  . Fatigue 05/09/2013  . Screening for hypercholesterolemia 05/09/2013  . OSA (obstructive sleep apnea)   . Obesity (BMI 30-39.9)     Dorene Ar, [place] 04/21/2016, 2:11 PM  [facility] [address] [place], [place],  [address] Phone: [phone]   Fax:  [phone]  Name: Pamela Underwood MRN: [id number] Date of Birth: 03-19-1956

## 2016-04-22 DIAGNOSIS — M1711 Unilateral primary osteoarthritis, right knee: Secondary | ICD-10-CM | POA: Diagnosis not present

## 2016-04-22 DIAGNOSIS — G4733 Obstructive sleep apnea (adult) (pediatric): Secondary | ICD-10-CM | POA: Diagnosis not present

## 2016-04-24 ENCOUNTER — Ambulatory Visit: Payer: 59 | Admitting: Physical Therapy

## 2016-04-25 ENCOUNTER — Ambulatory Visit: Payer: 59 | Attending: Orthopedic Surgery | Admitting: Physical Therapy

## 2016-04-25 DIAGNOSIS — M6281 Muscle weakness (generalized): Secondary | ICD-10-CM | POA: Insufficient documentation

## 2016-04-25 DIAGNOSIS — R600 Localized edema: Secondary | ICD-10-CM | POA: Diagnosis not present

## 2016-04-25 DIAGNOSIS — R2689 Other abnormalities of gait and mobility: Secondary | ICD-10-CM | POA: Insufficient documentation

## 2016-04-25 DIAGNOSIS — M25561 Pain in right knee: Secondary | ICD-10-CM | POA: Diagnosis not present

## 2016-04-25 DIAGNOSIS — G8929 Other chronic pain: Secondary | ICD-10-CM | POA: Insufficient documentation

## 2016-04-25 DIAGNOSIS — M25661 Stiffness of right knee, not elsewhere classified: Secondary | ICD-10-CM | POA: Diagnosis not present

## 2016-04-25 DIAGNOSIS — M179 Osteoarthritis of knee, unspecified: Secondary | ICD-10-CM | POA: Diagnosis not present

## 2016-04-25 NOTE — Therapy (Signed)
[place], [place], [address] Phone: [phone]   Fax:  [phone]  Physical Therapy Treatment  Patient Details  Name: Pamela Underwood MRN: [id number] Date of Birth: 1956-01-30 Referring Provider: Kathryne Hitch MD  Encounter Date: 04/25/2016    Past Medical History:  Diagnosis Date  . Arthritis   . Atrial tachycardia ([place])    takes rythmol daily and cardiazem daily  . Back pain    occasionally  . Breast cancer ([place]) 02/22/14   right upper inner  . Chronic anxiety    takes Xanax daily as needed when heart rate going up  . Colitis    hx-one  . Dysrhythmia    atrial tachycardia- takes meds and controls it  . Elevated BP 06/06/2015  . Insomnia    takes Ambien nightly as needed  . Joint pain   . Joint swelling   . S/P radiation therapy 03/28/2014 through 04/27/2014                                03/28/2014 through 04/27/2014                                                                         Right breast 4250 cGy 17 sessions, right breast boost 1000 cGy in 4 sessions (hypofractionated)                         . Sleep apnea    uses a CPAP    Past Surgical History:  Procedure Laterality Date  . BREAST BIOPSY Left 05/02/1999   benign  . BREAST LUMPECTOMY WITH AXILLARY LYMPH NODE BIOPSY Right 02/22/14   upper inner  . COLONOSCOPY    . CYSTOSCOPY    . KNEE ARTHROSCOPY WITH MEDIAL MENISECTOMY Left 03/31/2013   Procedure: LEFT KNEE ARTHROSCOPY WITH PARTIAL MEDIAL MENISECTOMY, CHONDROPLASTY OF PATELLA  FEMORAL JOINT, EXCISION OF MEDIAL PLICA, PARTIAL LATERAL MENISECTOMY;  Surgeon: [name], MD;  Location: [place];  Service: Orthopedics;  Laterality: Left;  . TOTAL KNEE ARTHROPLASTY Left 06/29/2013   Procedure: TOTAL KNEE ARTHROPLASTY;  Surgeon: [name], MD;  Location: [place];  Service: Orthopedics;  Laterality: Left;  . TOTAL KNEE ARTHROPLASTY Right 03/12/2016  . TOTAL  KNEE ARTHROPLASTY Right 03/12/2016   Procedure: RIGHT TOTAL KNEE ARTHROPLASTY;  Surgeon: [name], MD;  Location: [place];  Service: Orthopedics;  Laterality: Right;  . TUBAL LIGATION      There were no vitals filed for this visit.      Subjective Assessment - 04/25/16 1108    Subjective "I am feeling sore in my thigh from the E-stim from the previous session and haven't been able to sleep"    Currently in Pain? Yes   Pain Score 3    Pain Location Knee   Pain Orientation Right   Pain Type Surgical pain   Pain Onset More than a month ago            [facility] PT Assessment - 04/25/16 1121      AROM   Right Knee Extension -14  [place] Adult PT Treatment/Exercise - 04/25/16 0001      Knee/Hip Exercises: Aerobic   Recumbent Bike 10 min Level 1  lowering seat every 2 min for improved knee flexion     Knee/Hip Exercises: Supine   Short Arc Quad Sets AROM;2 sets;10 reps   Straight Leg Raises Strengthening;2 sets;10 reps     Vasopneumatic   Number Minutes Vasopneumatic  10 minutes   Vasopnuematic Location  Knee   Vasopneumatic Pressure Medium   Vasopneumatic Temperature  32     Manual Therapy   Manual therapy comments manual trigger point release manual trigger point release   Joint Mobilization A/P mobs and P/A mobs grade II  , patella mobs grade 2 inferior/ superior   Soft tissue mobilization IASTM over the medial knee and distal quads                  PT Short Term Goals - 04/21/16 1228      PT SHORT TERM GOAL #1   Title pt will be I with inital HEP (04/21/2016)   Time 3   Status Achieved     PT SHORT TERM GOAL #2   Title pt will improve R knee flexion to >/= 105 degrees and extension to </= -6 degrees with </= 4/10 pain for functional progression (04/21/2016)   Time 3   Period Weeks   Status Unable to assess     PT SHORT TERM GOAL #3   Title pt will be able to walk/ stand for >/= 15 min with no AD with </= 4/10 pain  to assist with unaided ambulation to promote funcitonal mobility (04/21/2016)   Baseline unable to stand/walk for 15 minutes, knee feels heavy    Time 3   Period Weeks   Status On-going           PT Long Term Goals - 03/31/16 1757      PT LONG TERM GOAL #1   Title she will be I with all HEP given as of last visit (05/12/2016)   Time 6   Period Weeks   Status New     PT LONG TERM GOAL #2   Title pt will improve her R knee flexion to >/= 120 degrees and extension to </= -4 degrees  with </= 2/10 pain for functional and efficient gait pattern (05/12/2016)   Time 6   Period Weeks   Status New     PT LONG TERM GOAL #3   Title she will increase her RLE strength to >/= 4+/5 with </= 2/10 pain for prolonged walking/ standing activities (05/12/2016)   Time 6   Period Weeks   Status New     PT LONG TERM GOAL #4   Title pt will be able to sit/ stand and walk >/= 45 min with no AD with </= 2/10 pain for functional endurance required or work and pt's personal goal of personal exercise (05/12/2016)   Time 6   Period Weeks   Status New     PT LONG TERM GOAL #5   Title she will increase her FOTO score to </= 46% limited to demontrate improvement at discharge (05/12/2016)   Time 6   Period Weeks   Status New               Plan - 04/25/16 1222    Clinical Impression Statement pt continues to report medial knee pain with limited quad contractio voluntarily. utilized manual to calm down muscle itightness and pain  to promote relief and mobs to for improved mobility. conitnued vaso for pain and swelling.    PT Next Visit Plan continue [place];  knee mobs, AROM/ PROM, pre-gait quad activation, Bike for flexion, Vaso for swelling/ pain,  Continue step ups, continue  wall slides.  More stretching.SLS exercises    Consulted and Agree with Plan of Care Patient      Patient will benefit from skilled therapeutic intervention in order to improve the following deficits and impairments:      Visit Diagnosis: Chronic pain of right knee  Stiffness of right knee, not elsewhere classified  Localized edema  Other abnormalities of gait and mobility     Problem List Patient Active Problem List   Diagnosis Date Noted  . S/P total knee arthroplasty 03/12/2016  . Obesity (BMI 35.0-39.9 without comorbidity) 06/13/2015  . Elevated BP 06/06/2015  . Elevated LDL cholesterol level 08/17/2014  . Breast cancer of upper-inner quadrant of right female breast ([place]) 02/28/2014  . DJD (degenerative joint disease) of knee 06/29/2013  . Paroxysmal supraventricular tachycardia ([place]) 05/09/2013  . Fatigue 05/09/2013  . Screening for hypercholesterolemia 05/09/2013  . OSA (obstructive sleep apnea)   . Obesity (BMI 30-39.9)    Starr Lake PT, DPT, LAT, ATC  04/25/16  12:26 PM      [facility] [address] [place], [place], [address] Phone: [phone]   Fax:  [phone]  Name: Pamela Underwood MRN: [id number] Date of Birth: 08/06/1955

## 2016-04-28 ENCOUNTER — Ambulatory Visit: Payer: 59 | Admitting: Physical Therapy

## 2016-04-28 MED FILL — PROPAFENONE HCL ER 225 MG C: 225 | 90 days supply | Qty: 180 | Fill #1

## 2016-04-28 MED FILL — ZOLPIDEM TARTRATE 10 MG TAB: 10 | 30 days supply | Qty: 30 | Fill #0

## 2016-04-30 ENCOUNTER — Ambulatory Visit: Payer: 59 | Admitting: Physical Therapy

## 2016-04-30 DIAGNOSIS — G8929 Other chronic pain: Secondary | ICD-10-CM | POA: Diagnosis not present

## 2016-04-30 DIAGNOSIS — R2689 Other abnormalities of gait and mobility: Secondary | ICD-10-CM | POA: Diagnosis not present

## 2016-04-30 DIAGNOSIS — M6281 Muscle weakness (generalized): Secondary | ICD-10-CM

## 2016-04-30 DIAGNOSIS — M25561 Pain in right knee: Principal | ICD-10-CM

## 2016-04-30 DIAGNOSIS — R600 Localized edema: Secondary | ICD-10-CM

## 2016-04-30 DIAGNOSIS — M179 Osteoarthritis of knee, unspecified: Secondary | ICD-10-CM | POA: Diagnosis not present

## 2016-04-30 DIAGNOSIS — M25661 Stiffness of right knee, not elsewhere classified: Secondary | ICD-10-CM

## 2016-04-30 NOTE — Therapy (Signed)
[place], [place], [address] Phone: [phone]   Fax:  [phone]  Physical Therapy Treatment  Patient Details  Name: Pamela Underwood MRN: [id number] Date of Birth: 10-19-1955 Referring Provider: Kathryne Hitch MD  Encounter Date: 04/30/2016      PT End of Session - 04/30/16 1407    Visit Number 8   Number of Visits 13   Date for PT Re-Evaluation 05/12/16   Authorization Type [facility] UMR   PT Start Time 1330   PT Stop Time 1425   PT Time Calculation (min) 55 min   Activity Tolerance Patient tolerated treatment well   Behavior During Therapy [facility] for tasks assessed/performed      Past Medical History:  Diagnosis Date  . Arthritis   . Atrial tachycardia ([place])    takes rythmol daily and cardiazem daily  . Back pain    occasionally  . Breast cancer ([place]) 02/22/14   right upper inner  . Chronic anxiety    takes Xanax daily as needed when heart rate going up  . Colitis    hx-one  . Dysrhythmia    atrial tachycardia- takes meds and controls it  . Elevated BP 06/06/2015  . Insomnia    takes Ambien nightly as needed  . Joint pain   . Joint swelling   . S/P radiation therapy 03/28/2014 through 04/27/2014                                03/28/2014 through 04/27/2014                                                                         Right breast 4250 cGy 17 sessions, right breast boost 1000 cGy in 4 sessions (hypofractionated)                         . Sleep apnea    uses a CPAP    Past Surgical History:  Procedure Laterality Date  . BREAST BIOPSY Left 05/02/1999   benign  . BREAST LUMPECTOMY WITH AXILLARY LYMPH NODE BIOPSY Right 02/22/14   upper inner  . COLONOSCOPY    . CYSTOSCOPY    . KNEE ARTHROSCOPY WITH MEDIAL MENISECTOMY Left 03/31/2013   Procedure: LEFT KNEE ARTHROSCOPY WITH PARTIAL MEDIAL MENISECTOMY, CHONDROPLASTY OF PATELLA  FEMORAL JOINT, EXCISION OF MEDIAL PLICA, PARTIAL LATERAL  MENISECTOMY;  Surgeon: [name], MD;  Location: [place];  Service: Orthopedics;  Laterality: Left;  . TOTAL KNEE ARTHROPLASTY Left 06/29/2013   Procedure: TOTAL KNEE ARTHROPLASTY;  Surgeon: [name], MD;  Location: [place];  Service: Orthopedics;  Laterality: Left;  . TOTAL KNEE ARTHROPLASTY Right 03/12/2016  . TOTAL KNEE ARTHROPLASTY Right 03/12/2016   Procedure: RIGHT TOTAL KNEE ARTHROPLASTY;  Surgeon: [name], MD;  Location: [place];  Service: Orthopedics;  Laterality: Right;  . TUBAL LIGATION      There were no vitals filed for this visit.      Subjective Assessment - 04/30/16 1335    Subjective "some soreness today, I may have alittle too much over the weekend" continued medial  pain along the VMO.   Currently in Pain? Yes   Pain Score 2    Pain Orientation Right   Pain Descriptors / Indicators Aching   Aggravating Factors  moving too quickly out of the car,    Pain Relieving Factors rest, ice                         [facility] Adult PT Treatment/Exercise - 04/30/16 1339      Knee/Hip Exercises: Stretches   Active Hamstring Stretch 2 reps;30 seconds     Knee/Hip Exercises: Aerobic   Recumbent Bike L1 x 6 min  lowered seat     Acupuncturist Location Right Manufacturing systems engineer Parameters 10/20, 2 sec ramp, 50% x 10 min, L 40   Electrical Stimulation Goals Strength     Vasopneumatic   Number Minutes Vasopneumatic  10 minutes   Vasopnuematic Location  Knee   Vasopneumatic Pressure Medium   Vasopneumatic Temperature  32     Manual Therapy   Manual therapy comments manual trigger point release manual trigger point release   Joint Mobilization A/P mobs and P/A mobs grade 3 with external rotation, patella mobs grade 2 inferior/ superior   Soft tissue mobilization IASTM over the medial knee and distal quads                  PT Short  Term Goals - 04/21/16 1228      PT SHORT TERM GOAL #1   Title pt will be I with inital HEP (04/21/2016)   Time 3   Status Achieved     PT SHORT TERM GOAL #2   Title pt will improve R knee flexion to >/= 105 degrees and extension to </= -6 degrees with </= 4/10 pain for functional progression (04/21/2016)   Time 3   Period Weeks   Status Unable to assess     PT SHORT TERM GOAL #3   Title pt will be able to walk/ stand for >/= 15 min with no AD with </= 4/10 pain to assist with unaided ambulation to promote funcitonal mobility (04/21/2016)   Baseline unable to stand/walk for 15 minutes, knee feels heavy    Time 3   Period Weeks   Status On-going           PT Long Term Goals - 03/31/16 1757      PT LONG TERM GOAL #1   Title she will be I with all HEP given as of last visit (05/12/2016)   Time 6   Period Weeks   Status New     PT LONG TERM GOAL #2   Title pt will improve her R knee flexion to >/= 120 degrees and extension to </= -4 degrees  with </= 2/10 pain for functional and efficient gait pattern (05/12/2016)   Time 6   Period Weeks   Status New     PT LONG TERM GOAL #3   Title she will increase her RLE strength to >/= 4+/5 with </= 2/10 pain for prolonged walking/ standing activities (05/12/2016)   Time 6   Period Weeks   Status New     PT LONG TERM GOAL #4   Title pt will be able to sit/ stand and walk >/= 45 min with no AD with </= 2/10 pain for functional endurance required or work and pt's personal goal of personal exercise (05/12/2016)  Time 6   Period Weeks   Status New     PT LONG TERM GOAL #5   Title she will increase her FOTO score to </= 46% limited to demontrate improvement at discharge (05/12/2016)   Time 6   Period Weeks   Status New               Plan - 04/30/16 1407    Clinical Impression Statement she reports only 2/10 pain today. worked on Comptroller [place] for quad contraction, and mobs to promote knee mobility.  continued vaso for pain and swelling.    PT Next Visit Plan continue [place];  knee mobs, AROM/ PROM, pre-gait quad activation, Bike for flexion, Vaso for swelling/ pain,  Continue step ups, continue  wall slides.  More stretching.SLS exercises    Consulted and Agree with Plan of Care Patient      Patient will benefit from skilled therapeutic intervention in order to improve the following deficits and impairments:  Abnormal gait, Pain, Improper body mechanics, Increased edema, Decreased strength, Decreased range of motion, Increased fascial restricitons, Hypomobility, Decreased endurance, Decreased activity tolerance, Difficulty walking  Visit Diagnosis: Chronic pain of right knee  Stiffness of right knee, not elsewhere classified  Localized edema  Muscle weakness (generalized)  Other abnormalities of gait and mobility     Problem List Patient Active Problem List   Diagnosis Date Noted  . S/P total knee arthroplasty 03/12/2016  . Obesity (BMI 35.0-39.9 without comorbidity) 06/13/2015  . Elevated BP 06/06/2015  . Elevated LDL cholesterol level 08/17/2014  . Breast cancer of upper-inner quadrant of right female breast ([place]) 02/28/2014  . DJD (degenerative joint disease) of knee 06/29/2013  . Paroxysmal supraventricular tachycardia ([place]) 05/09/2013  . Fatigue 05/09/2013  . Screening for hypercholesterolemia 05/09/2013  . OSA (obstructive sleep apnea)   . Obesity (BMI 30-39.9)    Starr Lake PT, DPT, LAT, ATC  04/30/16  2:18 PM      [facility] [address] [place], [place], [address] Phone: [phone]   Fax:  [phone]  Name: Pamela Underwood MRN: [id number] Date of Birth: 12-04-1955

## 2016-05-01 ENCOUNTER — Ambulatory Visit: Payer: 59 | Admitting: Physical Therapy

## 2016-05-01 DIAGNOSIS — M25661 Stiffness of right knee, not elsewhere classified: Secondary | ICD-10-CM

## 2016-05-01 DIAGNOSIS — R600 Localized edema: Secondary | ICD-10-CM | POA: Diagnosis not present

## 2016-05-01 DIAGNOSIS — G8929 Other chronic pain: Secondary | ICD-10-CM

## 2016-05-01 DIAGNOSIS — R2689 Other abnormalities of gait and mobility: Secondary | ICD-10-CM | POA: Diagnosis not present

## 2016-05-01 DIAGNOSIS — M6281 Muscle weakness (generalized): Secondary | ICD-10-CM

## 2016-05-01 DIAGNOSIS — M25561 Pain in right knee: Secondary | ICD-10-CM | POA: Diagnosis not present

## 2016-05-01 DIAGNOSIS — M179 Osteoarthritis of knee, unspecified: Secondary | ICD-10-CM | POA: Diagnosis not present

## 2016-05-01 MED FILL — HYDROCODON-APAP 5-325: 5-325 | 15 days supply | Qty: 60 | Fill #0

## 2016-05-01 MED FILL — METHOCARBAMOL 500 MG TABLET: 500 | 13 days supply | Qty: 40 | Fill #0

## 2016-05-01 NOTE — Therapy (Signed)
[place], [place], [address] Phone: [phone]   Fax:  [phone]  Physical Therapy Treatment  Patient Details  Name: Pamela Underwood MRN: [id number] Date of Birth: [birth year] Referring Provider: Kathryne Hitch MD  Encounter Date: 05/01/2016      PT End of Session - 05/01/16 1240    Visit Number 9   Number of Visits 13   Date for PT Re-Evaluation 05/12/16   PT Start Time [id number]  pt arrived 12 minute late   PT Stop Time 1245   PT Time Calculation (min) 49 min   Activity Tolerance Patient tolerated treatment well   Behavior During Therapy [facility] for tasks assessed/performed      Past Medical History:  Diagnosis Date  . Arthritis   . Atrial tachycardia ([place])    takes rythmol daily and cardiazem daily  . Back pain    occasionally  . Breast cancer ([place]) 02/22/14   right upper inner  . Chronic anxiety    takes Xanax daily as needed when heart rate going up  . Colitis    hx-one  . Dysrhythmia    atrial tachycardia- takes meds and controls it  . Elevated BP 06/06/2015  . Insomnia    takes Ambien nightly as needed  . Joint pain   . Joint swelling   . S/P radiation therapy 03/28/2014 through 04/27/2014                                03/28/2014 through 04/27/2014                                                                         Right breast 4250 cGy 17 sessions, right breast boost 1000 cGy in 4 sessions (hypofractionated)                         . Sleep apnea    uses a CPAP    Past Surgical History:  Procedure Laterality Date  . BREAST BIOPSY Left 05/02/1999   benign  . BREAST LUMPECTOMY WITH AXILLARY LYMPH NODE BIOPSY Right 02/22/14   upper inner  . COLONOSCOPY    . CYSTOSCOPY    . KNEE ARTHROSCOPY WITH MEDIAL MENISECTOMY Left 03/31/2013   Procedure: LEFT KNEE ARTHROSCOPY WITH PARTIAL MEDIAL MENISECTOMY, CHONDROPLASTY OF PATELLA  FEMORAL JOINT, EXCISION OF MEDIAL PLICA, PARTIAL LATERAL  MENISECTOMY;  Surgeon: [name], MD;  Location: [place];  Service: Orthopedics;  Laterality: Left;  . TOTAL KNEE ARTHROPLASTY Left 06/29/2013   Procedure: TOTAL KNEE ARTHROPLASTY;  Surgeon: [name], MD;  Location: [place];  Service: Orthopedics;  Laterality: Left;  . TOTAL KNEE ARTHROPLASTY Right 03/12/2016  . TOTAL KNEE ARTHROPLASTY Right 03/12/2016   Procedure: RIGHT TOTAL KNEE ARTHROPLASTY;  Surgeon: [name], MD;  Location: [place];  Service: Orthopedics;  Laterality: Right;  . TUBAL LIGATION      There were no vitals filed for this visit.      Subjective Assessment - 05/01/16 1159    Subjective "I was expecting soreness in the knee after last session but it really wasn't  that bad"    Currently in Pain? Yes   Pain Score 2    Pain Location Knee   Pain Orientation Right   Pain Descriptors / Indicators Aching   Pain Type Surgical pain   Pain Onset More than a month ago            [facility] PT Assessment - 05/01/16 0001      Observation/Other Assessments   Focus on Therapeutic Outcomes (FOTO)  40% limited     AROM   Right Knee Extension -12   Right Knee Flexion 110                     [facility] Adult PT Treatment/Exercise - 05/01/16 1200      Knee/Hip Exercises: Stretches   Active Hamstring Stretch 2 reps;30 seconds     Knee/Hip Exercises: Aerobic   Nustep L6 x 5 min  LE only     Knee/Hip Exercises: Supine   Short Arc Quad Sets AROM;Strengthening;Right;2 sets;15 reps     Manual Therapy   Joint Mobilization  P/A mobs grade 3 with external rotation                  PT Short Term Goals - 04/21/16 1228      PT SHORT TERM GOAL #1   Title pt will be I with inital HEP (04/21/2016)   Time 3   Status Achieved     PT SHORT TERM GOAL #2   Title pt will improve R knee flexion to >/= 105 degrees and extension to </= -6 degrees with </= 4/10 pain for functional progression (04/21/2016)   Time 3   Period Weeks    Status Unable to assess     PT SHORT TERM GOAL #3   Title pt will be able to walk/ stand for >/= 15 min with no AD with </= 4/10 pain to assist with unaided ambulation to promote funcitonal mobility (04/21/2016)   Baseline unable to stand/walk for 15 minutes, knee feels heavy    Time 3   Period Weeks   Status On-going           PT Long Term Goals - 03/31/16 1757      PT LONG TERM GOAL #1   Title she will be I with all HEP given as of last visit (05/12/2016)   Time 6   Period Weeks   Status New     PT LONG TERM GOAL #2   Title pt will improve her R knee flexion to >/= 120 degrees and extension to </= -4 degrees  with </= 2/10 pain for functional and efficient gait pattern (05/12/2016)   Time 6   Period Weeks   Status New     PT LONG TERM GOAL #3   Title she will increase her RLE strength to >/= 4+/5 with </= 2/10 pain for prolonged walking/ standing activities (05/12/2016)   Time 6   Period Weeks   Status New     PT LONG TERM GOAL #4   Title pt will be able to sit/ stand and walk >/= 45 min with no AD with </= 2/10 pain for functional endurance required or work and pt's personal goal of personal exercise (05/12/2016)   Time 6   Period Weeks   Status New     PT LONG TERM GOAL #5   Title she will increase her FOTO score to </= 46% limited to demontrate improvement at discharge (05/12/2016)   Time  6   Period Weeks   Status New               Plan - 05/01/16 1241    Clinical Impression Statement pt arrived 12 minutes late today. she continues to report 2/10 pain in the  Rknee. Focused on ROM via mobs working into extension with external rotation to facilitate screw home mechanism. she peformed all exercis without complaint. ocnitnued vaso for pain and swelling post session.    PT Next Visit Plan continue [place];  knee mobs extension with external rotation , AROM/ PROM, bike vs Nu-step Vaso for swelling/ pain,  step-ups/ down, resisted gait walking,    PT Home  Exercise Plan heel slides with strap, clam shell, heel raises, quad set, gait with exaggerated heel strike and toe off   Consulted and Agree with Plan of Care Patient      Patient will benefit from skilled therapeutic intervention in order to improve the following deficits and impairments:  Abnormal gait, Pain, Improper body mechanics, Increased edema, Decreased strength, Decreased range of motion, Increased fascial restricitons, Hypomobility, Decreased endurance, Decreased activity tolerance, Difficulty walking  Visit Diagnosis: Chronic pain of right knee  Stiffness of right knee, not elsewhere classified  Localized edema  Muscle weakness (generalized)  Other abnormalities of gait and mobility     Problem List Patient Active Problem List   Diagnosis Date Noted  . S/P total knee arthroplasty 03/12/2016  . Obesity (BMI 35.0-39.9 without comorbidity) 06/13/2015  . Elevated BP 06/06/2015  . Elevated LDL cholesterol level 08/17/2014  . Breast cancer of upper-inner quadrant of right female breast ([place]) 02/28/2014  . DJD (degenerative joint disease) of knee 06/29/2013  . Paroxysmal supraventricular tachycardia ([place]) 05/09/2013  . Fatigue 05/09/2013  . Screening for hypercholesterolemia 05/09/2013  . OSA (obstructive sleep apnea)   . Obesity (BMI 30-39.9)    Starr Lake PT, DPT, LAT, ATC  05/01/16  12:47 PM      [facility] [address] [place], [place], [address] Phone: [phone]   Fax:  [phone]  Name: Pamela Underwood MRN: [id number] Date of Birth: 10/27/1955

## 2016-05-02 MED FILL — ALPRAZolam 0.25 MG TABS: 0.25 | 30 days supply | Qty: 30 | Fill #0

## 2016-05-05 ENCOUNTER — Ambulatory Visit: Payer: 59 | Admitting: Physical Therapy

## 2016-05-05 DIAGNOSIS — M6281 Muscle weakness (generalized): Secondary | ICD-10-CM

## 2016-05-05 DIAGNOSIS — R2689 Other abnormalities of gait and mobility: Secondary | ICD-10-CM

## 2016-05-05 DIAGNOSIS — M25661 Stiffness of right knee, not elsewhere classified: Secondary | ICD-10-CM | POA: Diagnosis not present

## 2016-05-05 DIAGNOSIS — R600 Localized edema: Secondary | ICD-10-CM

## 2016-05-05 DIAGNOSIS — M25561 Pain in right knee: Secondary | ICD-10-CM | POA: Diagnosis not present

## 2016-05-05 DIAGNOSIS — G8929 Other chronic pain: Secondary | ICD-10-CM | POA: Diagnosis not present

## 2016-05-05 DIAGNOSIS — M179 Osteoarthritis of knee, unspecified: Secondary | ICD-10-CM | POA: Diagnosis not present

## 2016-05-05 NOTE — Therapy (Signed)
[place], [place], [address] Phone: [phone]   Fax:  [phone]  Physical Therapy Treatment  Patient Details  Name: Pamela Underwood MRN: [id number] Date of Birth: August 17, 1955 Referring Provider: Kathryne Hitch MD  Encounter Date: 05/05/2016      PT End of Session - 05/05/16 1233    Visit Number 10   Number of Visits 13   Date for PT Re-Evaluation 05/12/16   Authorization Type [facility] UMR   PT Start Time 1146   PT Stop Time 1232   PT Time Calculation (min) 46 min   Activity Tolerance Patient tolerated treatment well   Behavior During Therapy [facility] for tasks assessed/performed      Past Medical History:  Diagnosis Date  . Arthritis   . Atrial tachycardia ([place])    takes rythmol daily and cardiazem daily  . Back pain    occasionally  . Breast cancer ([place]) 02/22/14   right upper inner  . Chronic anxiety    takes Xanax daily as needed when heart rate going up  . Colitis    hx-one  . Dysrhythmia    atrial tachycardia- takes meds and controls it  . Elevated BP 06/06/2015  . Insomnia    takes Ambien nightly as needed  . Joint pain   . Joint swelling   . S/P radiation therapy 03/28/2014 through 04/27/2014                                03/28/2014 through 04/27/2014                                                                         Right breast 4250 cGy 17 sessions, right breast boost 1000 cGy in 4 sessions (hypofractionated)                         . Sleep apnea    uses a CPAP    Past Surgical History:  Procedure Laterality Date  . BREAST BIOPSY Left 05/02/1999   benign  . BREAST LUMPECTOMY WITH AXILLARY LYMPH NODE BIOPSY Right 02/22/14   upper inner  . COLONOSCOPY    . CYSTOSCOPY    . KNEE ARTHROSCOPY WITH MEDIAL MENISECTOMY Left 03/31/2013   Procedure: LEFT KNEE ARTHROSCOPY WITH PARTIAL MEDIAL MENISECTOMY, CHONDROPLASTY OF PATELLA  FEMORAL JOINT, EXCISION OF MEDIAL PLICA, PARTIAL LATERAL  MENISECTOMY;  Surgeon: [name], MD;  Location: [place];  Service: Orthopedics;  Laterality: Left;  . TOTAL KNEE ARTHROPLASTY Left 06/29/2013   Procedure: TOTAL KNEE ARTHROPLASTY;  Surgeon: [name], MD;  Location: [place];  Service: Orthopedics;  Laterality: Left;  . TOTAL KNEE ARTHROPLASTY Right 03/12/2016  . TOTAL KNEE ARTHROPLASTY Right 03/12/2016   Procedure: RIGHT TOTAL KNEE ARTHROPLASTY;  Surgeon: [name], MD;  Location: [place];  Service: Orthopedics;  Laterality: Right;  . TUBAL LIGATION      There were no vitals filed for this visit.      Subjective Assessment - 05/05/16 1152    Subjective "I am not doing too bad, still have some medial knee pain only with  turning the wrong way or sleeping at night"    Currently in Pain? Yes   Pain Score 2    Pain Location Knee   Pain Orientation Right   Pain Descriptors / Indicators Aching   Pain Type Surgical pain   Pain Onset More than a month ago   Pain Frequency Constant                         [facility] Adult PT Treatment/Exercise - 05/05/16 1153      Knee/Hip Exercises: Stretches   Active Hamstring Stretch 3 reps;30 seconds  with strap   Quad Stretch 3 reps;30 seconds  prone with strap     Knee/Hip Exercises: Aerobic   Nustep L5 x 8 min  LE only     Knee/Hip Exercises: Standing   Gait Training exagerrated heel strike to promote gait efficiency 2 x 35 ft   Other Standing Knee Exercises resisted walking 3x forward/ backward 10#, 2 x laterally 7#     Acupuncturist Location Right Market researcher Russian   Electrical Stimulation Parameters 10/10, 2 sec ramp, l 49 x 10 min, PPS 100,   Electrical Stimulation Goals Strength;Neuromuscular facilitation     Manual Therapy   Joint Mobilization  P/A mobs grade 3 with external rotation                PT Education - 05/05/16 1232    Education provided Yes   Education  Details review of benefits of hitting the heel when walking to promote proper gait biomechanics and  energy efficiency. update HEP for knee extension via prone hangs.    Person(s) Educated Patient   Methods Explanation;Verbal cues   Comprehension Verbalized understanding;Verbal cues required          PT Short Term Goals - 04/21/16 1228      PT SHORT TERM GOAL #1   Title pt will be I with inital HEP (04/21/2016)   Time 3   Status Achieved     PT SHORT TERM GOAL #2   Title pt will improve R knee flexion to >/= 105 degrees and extension to </= -6 degrees with </= 4/10 pain for functional progression (04/21/2016)   Time 3   Period Weeks   Status Unable to assess     PT SHORT TERM GOAL #3   Title pt will be able to walk/ stand for >/= 15 min with no AD with </= 4/10 pain to assist with unaided ambulation to promote funcitonal mobility (04/21/2016)   Baseline unable to stand/walk for 15 minutes, knee feels heavy    Time 3   Period Weeks   Status On-going           PT Long Term Goals - 03/31/16 1757      PT LONG TERM GOAL #1   Title she will be I with all HEP given as of last visit (05/12/2016)   Time 6   Period Weeks   Status New     PT LONG TERM GOAL #2   Title pt will improve her R knee flexion to >/= 120 degrees and extension to </= -4 degrees  with </= 2/10 pain for functional and efficient gait pattern (05/12/2016)   Time 6   Period Weeks   Status New     PT LONG TERM GOAL #3   Title she will increase her RLE strength to >/= 4+/5 with </=  2/10 pain for prolonged walking/ standing activities (05/12/2016)   Time 6   Period Weeks   Status New     PT LONG TERM GOAL #4   Title pt will be able to sit/ stand and walk >/= 45 min with no AD with </= 2/10 pain for functional endurance required or work and pt's personal goal of personal exercise (05/12/2016)   Time 6   Period Weeks   Status New     PT LONG TERM GOAL #5   Title she will increase her FOTO score to </=  46% limited to demontrate improvement at discharge (05/12/2016)   Time 6   Period Weeks   Status New               Plan - 05/05/16 1233    Clinical Impression Statement pt contineus to rpeort 2/10 pain. continued russian stim to promote quad activation. Followed E-stim with exercise and resisted walking to continue to promote strengthening and quad activation.    PT Next Visit Plan continue [place] PRN ;  knee mobs extension with external rotation , AROM/ PROM, bike vs Nu-step Vaso for swelling/ pain,  step-ups/ down, resisted gait walking,    PT Home Exercise Plan heel slides with strap, clam shell, heel raises, quad set, gait with exaggerated heel strike and toe off, prone hangs    Consulted and Agree with Plan of Care Patient      Patient will benefit from skilled therapeutic intervention in order to improve the following deficits and impairments:  Abnormal gait, Pain, Improper body mechanics, Increased edema, Decreased strength, Decreased range of motion, Increased fascial restricitons, Hypomobility, Decreased endurance, Decreased activity tolerance, Difficulty walking  Visit Diagnosis: Chronic pain of right knee  Stiffness of right knee, not elsewhere classified  Localized edema  Muscle weakness (generalized)  Other abnormalities of gait and mobility     Problem List Patient Active Problem List   Diagnosis Date Noted  . S/P total knee arthroplasty 03/12/2016  . Obesity (BMI 35.0-39.9 without comorbidity) 06/13/2015  . Elevated BP 06/06/2015  . Elevated LDL cholesterol level 08/17/2014  . Breast cancer of upper-inner quadrant of right female breast ([place]) 02/28/2014  . DJD (degenerative joint disease) of knee 06/29/2013  . Paroxysmal supraventricular tachycardia ([place]) 05/09/2013  . Fatigue 05/09/2013  . Screening for hypercholesterolemia 05/09/2013  . OSA (obstructive sleep apnea)   . Obesity (BMI 30-39.9)    Starr Lake PT, DPT, LAT, ATC  05/05/16   12:38 PM      [facility] [address] [place], [place], [address] Phone: [phone]   Fax:  [phone]  Name: Pamela Underwood MRN: [id number] Date of Birth: May 17, 1956

## 2016-05-07 ENCOUNTER — Ambulatory Visit: Payer: 59 | Admitting: Physical Therapy

## 2016-05-07 DIAGNOSIS — R2689 Other abnormalities of gait and mobility: Secondary | ICD-10-CM | POA: Diagnosis not present

## 2016-05-07 DIAGNOSIS — M25661 Stiffness of right knee, not elsewhere classified: Secondary | ICD-10-CM

## 2016-05-07 DIAGNOSIS — G8929 Other chronic pain: Secondary | ICD-10-CM

## 2016-05-07 DIAGNOSIS — M6281 Muscle weakness (generalized): Secondary | ICD-10-CM | POA: Diagnosis not present

## 2016-05-07 DIAGNOSIS — R600 Localized edema: Secondary | ICD-10-CM

## 2016-05-07 DIAGNOSIS — M179 Osteoarthritis of knee, unspecified: Secondary | ICD-10-CM | POA: Diagnosis not present

## 2016-05-07 DIAGNOSIS — M25561 Pain in right knee: Principal | ICD-10-CM

## 2016-05-07 NOTE — Therapy (Signed)
[facility] [address] [place], [place], [address] Phone: [phone]   Fax:  [phone]  Physical Therapy Treatment  Patient Details  Name: Pamela Underwood MRN: [id number] Date of Birth: [birth year] Referring Provider: Mckinley Jewel MD  Encounter Date: 05/07/2016      PT End of Session - 05/07/16 1153    Visit Number 11   Number of Visits 13   Date for PT Re-Evaluation 05/12/16   Authorization Type [facility] UMR   PT Start Time 1145   PT Stop Time 1240   PT Time Calculation (min) 55 min      Past Medical History:  Diagnosis Date  . Arthritis   . Atrial tachycardia (HCC)    takes rythmol daily and cardiazem daily  . Back pain    occasionally  . Breast cancer (HCC) 02/22/14   right upper inner  . Chronic anxiety    takes Xanax daily as needed when heart rate going up  . Colitis    hx-one  . Dysrhythmia    atrial tachycardia- takes meds and controls it  . Elevated BP 06/06/2015  . Insomnia    takes Ambien nightly as needed  . Joint pain   . Joint swelling   . S/P radiation therapy 03/28/2014 through 04/27/2014                                03/28/2014 through 04/27/2014                                                                         Right breast 4250 cGy 17 sessions, right breast boost 1000 cGy in 4 sessions (hypofractionated)                         . Sleep apnea    uses a CPAP    Past Surgical History:  Procedure Laterality Date  . BREAST BIOPSY Left 05/02/1999   benign  . BREAST LUMPECTOMY WITH AXILLARY LYMPH NODE BIOPSY Right 02/22/14   upper inner  . COLONOSCOPY    . CYSTOSCOPY    . KNEE ARTHROSCOPY WITH MEDIAL MENISECTOMY Left 03/31/2013   Procedure: LEFT KNEE ARTHROSCOPY WITH PARTIAL MEDIAL MENISECTOMY, CHONDROPLASTY OF PATELLA  FEMORAL JOINT, EXCISION OF MEDIAL PLICA, PARTIAL LATERAL MENISECTOMY;  Surgeon: Loreta Ave, MD;  Location: [facility];  Service: Orthopedics;  Laterality:  Left;  . TOTAL KNEE ARTHROPLASTY Left 06/29/2013   Procedure: TOTAL KNEE ARTHROPLASTY;  Surgeon: Loreta Ave, MD;  Location: [facility] OR;  Service: Orthopedics;  Laterality: Left;  . TOTAL KNEE ARTHROPLASTY Right 03/12/2016  . TOTAL KNEE ARTHROPLASTY Right 03/12/2016   Procedure: RIGHT TOTAL KNEE ARTHROPLASTY;  Surgeon: Loreta Ave, MD;  Location: [facility] OR;  Service: Orthopedics;  Laterality: Right;  . TUBAL LIGATION      There were no vitals filed for this visit.      Subjective Assessment - 05/07/16 1152    Subjective Only bothersome at night and low seats, going down stairs    Currently in Pain? Yes   Pain Score 2    Pain Location Knee   Pain  Orientation Right   Pain Descriptors / Indicators Aching   Aggravating Factors  getting in and out of smaller car, prolonged sitting, down stairs    Pain Relieving Factors rest, ice             [facility] PT Assessment - 05/07/16 0001      AROM   Right Knee Extension -12   Right Knee Flexion 112                     [facility] Adult PT Treatment/Exercise - 05/07/16 0001      Knee/Hip Exercises: Stretches   Active Hamstring Stretch 3 reps;30 seconds  with strap   Quad Stretch 3 reps;30 seconds  prone with strap     Knee/Hip Exercises: Aerobic   Recumbent Bike L2 x 7 minutes      Knee/Hip Exercises: Machines for Strengthening   Cybex Knee Flexion 20# bilateral, 15# bilateral with RLE eccentric x 15      Knee/Hip Exercises: Standing   Terminal Knee Extension Limitations Terminal knee extension green band 3 way x 10 each    Lateral Step Up 10 reps;Step Height: 4";Hand Hold: 1   Forward Step Up 10 reps;Step Height: 6";Hand Hold: 1   Step Down 10 reps;Hand Hold: 2;Step Height: 4"   Wall Squat 10 reps   SLS 30 seconds    Gait Training exagerrated heel strike to promote gait efficiency 2 x 35 ft     Cryotherapy   Number Minutes Cryotherapy 10 Minutes   Cryotherapy Location Knee  rt   Type of Cryotherapy Ice pack                   PT Short Term Goals - 05/07/16 1154      PT SHORT TERM GOAL #1   Title 8-   Time 3   Status Achieved     PT SHORT TERM GOAL #2   Title pt will improve R knee flexion to >/= 105 degrees and extension to </= -6 degrees with </= 4/10 pain for functional progression (04/21/2016)   Baseline 8-112   Time 3   Period Weeks   Status Partially Met     PT SHORT TERM GOAL #3   Title pt will be able to walk/ stand for >/= 15 min with no AD with </= 4/10 pain to assist with unaided ambulation to promote funcitonal mobility (04/21/2016)   Baseline vacuming 10 minutes, grocerry strore 15 minutes up to 3/10 pain   Time 3   Period Weeks   Status Achieved           PT Long Term Goals - 05/07/16 1156      PT LONG TERM GOAL #1   Title she will be I with all HEP given as of last visit (05/12/2016)   Status On-going     PT LONG TERM GOAL #2   Title pt will improve her R knee flexion to >/= 120 degrees and extension to </= -4 degrees  with </= 2/10 pain for functional and efficient gait pattern (05/12/2016)   Baseline 8-112   Time 6   Period Weeks   Status Partially Met     PT LONG TERM GOAL #3   Title she will increase her RLE strength to >/= 4+/5 with </= 2/10 pain for prolonged walking/ standing activities (05/12/2016)   Baseline 4/5 right knee   Time 6   Period Weeks   Status Partially Met  PT LONG TERM GOAL #4   Title pt will be able to sit/ stand and walk >/= 45 min with no AD with </= 2/10 pain for functional endurance required or work and pt's personal goal of personal exercise (05/12/2016)   Baseline sitting 15 minutes, standing/walking 15 minutes    Time 6   Period Weeks   Status On-going     PT LONG TERM GOAL #5   Title she will increase her FOTO score to </= 46% limited to demontrate improvement at discharge (05/12/2016)   Baseline 40% limited on 12/7    Time 6   Period Weeks   Status Achieved               Plan - 05/07/16 1242     Clinical Impression Statement Worked on quad/eccentric qud strength with good tolerance. ROM /strength improving. Activity tolerance improving. She is performing prone knee hang at home. See goals Met.    PT Next Visit Plan continue [place] PRN ;  knee mobs extension with external rotation , AROM/ PROM, bike vs Nu-step Vaso for swelling/ pain,  step-ups/ down, resisted gait walking,    PT Home Exercise Plan heel slides with strap, clam shell, heel raises, quad set, gait with exaggerated heel strike and toe off, prone hangs       Patient will benefit from skilled therapeutic intervention in order to improve the following deficits and impairments:  Abnormal gait, Pain, Improper body mechanics, Increased edema, Decreased strength, Decreased range of motion, Increased fascial restricitons, Hypomobility, Decreased endurance, Decreased activity tolerance, Difficulty walking  Visit Diagnosis: Chronic pain of right knee  Stiffness of right knee, not elsewhere classified  Localized edema  Muscle weakness (generalized)  Other abnormalities of gait and mobility     Problem List Patient Active Problem List   Diagnosis Date Noted  . S/P total knee arthroplasty 03/12/2016  . Obesity (BMI 35.0-39.9 without comorbidity) 06/13/2015  . Elevated BP 06/06/2015  . Elevated LDL cholesterol level 08/17/2014  . Breast cancer of upper-inner quadrant of right female breast ([place]) 02/28/2014  . DJD (degenerative joint disease) of knee 06/29/2013  . Paroxysmal supraventricular tachycardia ([place]) 05/09/2013  . Fatigue 05/09/2013  . Screening for hypercholesterolemia 05/09/2013  . OSA (obstructive sleep apnea)   . Obesity (BMI 30-39.9)     Dorene Ar, [place] 05/07/2016, 12:49 PM  [facility] [address] [place], [place], [address] Phone: [phone]   Fax:  [phone]  Name: Pamela Underwood MRN: [id number] Date of Birth:  01-25-1956

## 2016-05-12 ENCOUNTER — Ambulatory Visit: Payer: 59 | Admitting: Physical Therapy

## 2016-05-14 ENCOUNTER — Ambulatory Visit: Payer: 59 | Admitting: Physical Therapy

## 2016-05-14 DIAGNOSIS — M179 Osteoarthritis of knee, unspecified: Secondary | ICD-10-CM | POA: Diagnosis not present

## 2016-05-14 DIAGNOSIS — G8929 Other chronic pain: Secondary | ICD-10-CM | POA: Diagnosis not present

## 2016-05-14 DIAGNOSIS — R2689 Other abnormalities of gait and mobility: Secondary | ICD-10-CM | POA: Diagnosis not present

## 2016-05-14 DIAGNOSIS — R600 Localized edema: Secondary | ICD-10-CM | POA: Diagnosis not present

## 2016-05-14 DIAGNOSIS — M25661 Stiffness of right knee, not elsewhere classified: Secondary | ICD-10-CM

## 2016-05-14 DIAGNOSIS — M25561 Pain in right knee: Principal | ICD-10-CM

## 2016-05-14 DIAGNOSIS — M6281 Muscle weakness (generalized): Secondary | ICD-10-CM

## 2016-05-14 MED FILL — HYDROCODON-APAP 5-325: 5-325 | 20 days supply | Qty: 60 | Fill #0

## 2016-05-14 NOTE — Therapy (Addendum)
[facility] [address] [place], [place], [address] Phone: [phone]   Fax:  [phone]  Physical Therapy Treatment / Re-certification  Patient Details  Name: Pamela Underwood MRN: [id number] Date of Birth: [birth year] Referring Provider: Mckinley Jewel MD  Encounter Date: 05/14/2016      PT End of Session - 05/14/16 1155    Visit Number 12   Number of Visits 13   Date for PT Re-Evaluation 05/12/16   Authorization Type [facility] UMR   PT Start Time 1144   PT Stop Time 1237   PT Time Calculation (min) 53 min      Past Medical History:  Diagnosis Date  . Arthritis   . Atrial tachycardia (HCC)    takes rythmol daily and cardiazem daily  . Back pain    occasionally  . Breast cancer (HCC) 02/22/14   right upper inner  . Chronic anxiety    takes Xanax daily as needed when heart rate going up  . Colitis    hx-one  . Dysrhythmia    atrial tachycardia- takes meds and controls it  . Elevated BP 06/06/2015  . Insomnia    takes Ambien nightly as needed  . Joint pain   . Joint swelling   . S/P radiation therapy 03/28/2014 through 04/27/2014                                03/28/2014 through 04/27/2014                                                                         Right breast 4250 cGy 17 sessions, right breast boost 1000 cGy in 4 sessions (hypofractionated)                         . Sleep apnea    uses a CPAP    Past Surgical History:  Procedure Laterality Date  . BREAST BIOPSY Left 05/02/1999   benign  . BREAST LUMPECTOMY WITH AXILLARY LYMPH NODE BIOPSY Right 02/22/14   upper inner  . COLONOSCOPY    . CYSTOSCOPY    . KNEE ARTHROSCOPY WITH MEDIAL MENISECTOMY Left 03/31/2013   Procedure: LEFT KNEE ARTHROSCOPY WITH PARTIAL MEDIAL MENISECTOMY, CHONDROPLASTY OF PATELLA  FEMORAL JOINT, EXCISION OF MEDIAL PLICA, PARTIAL LATERAL MENISECTOMY;  Surgeon: Loreta Ave, MD;  Location: [facility];  Service:  Orthopedics;  Laterality: Left;  . TOTAL KNEE ARTHROPLASTY Left 06/29/2013   Procedure: TOTAL KNEE ARTHROPLASTY;  Surgeon: Loreta Ave, MD;  Location: [facility] OR;  Service: Orthopedics;  Laterality: Left;  . TOTAL KNEE ARTHROPLASTY Right 03/12/2016  . TOTAL KNEE ARTHROPLASTY Right 03/12/2016   Procedure: RIGHT TOTAL KNEE ARTHROPLASTY;  Surgeon: Loreta Ave, MD;  Location: [facility] OR;  Service: Orthopedics;  Laterality: Right;  . TUBAL LIGATION      There were no vitals filed for this visit.      Subjective Assessment - 05/14/16 1154    Currently in Pain? Yes   Pain Score 3    Pain Location Knee   Pain Orientation Right   Pain Descriptors / Indicators Sore   Aggravating  Factors  difficulty sleeping   Pain Relieving Factors rest, ice             [facility] PT Assessment - 05/14/16 0001      AROM   Right Knee Extension -10  -8 at end of tx    Right Knee Flexion 112     Strength   Right Knee Flexion 4/5   Right Knee Extension 4/5                     [facility] Adult PT Treatment/Exercise - 05/14/16 0001      Knee/Hip Exercises: Stretches   Other Knee/Hip Stretches slant board stretch x 1 minutes, instructed in step stretch      Knee/Hip Exercises: Supine   Quad Sets 20 reps   Terminal Knee Extension 20 reps   Theraband Level (Terminal Knee Extension) Level 3 (Green)     Cryotherapy   Number Minutes Cryotherapy 10 Minutes   Cryotherapy Location Knee   Type of Cryotherapy Ice pack     Manual Therapy   Manual Therapy Taping   Joint Mobilization A/P mobs and P/A mobs grade II  , patella mobs grade 2 inferior/ superior   Kinesiotex --  Knee support tape                   PT Short Term Goals - 05/07/16 1154      PT SHORT TERM GOAL #1   Title 8-   Time 3   Status Achieved     PT SHORT TERM GOAL #2   Title pt will improve R knee flexion to >/= 105 degrees and extension to </= -6 degrees with </= 4/10 pain for functional progression (04/21/2016)    Baseline 8-112   Time 3   Period Weeks   Status Partially Met     PT SHORT TERM GOAL #3   Title pt will be able to walk/ stand for >/= 15 min with no AD with </= 4/10 pain to assist with unaided ambulation to promote funcitonal mobility (04/21/2016)   Baseline vacuming 10 minutes, grocerry strore 15 minutes up to 3/10 pain   Time 3   Period Weeks   Status Achieved           PT Long Term Goals - 05/14/16 1251      PT LONG TERM GOAL #1   Title she will be I with all HEP given as of last visit (05/12/2016)   Time 6   Period Weeks   Status On-going     PT LONG TERM GOAL #2   Title pt will improve her R knee flexion to >/= 120 degrees and extension to </= -4 degrees  with </= 2/10 pain for functional and efficient gait pattern (05/12/2016)   Baseline 8-112   Time 6   Period Weeks   Status Partially Met     PT LONG TERM GOAL #3   Title she will increase her RLE strength to >/= 4+/5 with </= 2/10 pain for prolonged walking/ standing activities (05/12/2016)   Baseline 4/5 right knee   Period Weeks   Status Partially Met     PT LONG TERM GOAL #4   Title pt will be able to sit/ stand and walk >/= 45 min with no AD with </= 2/10 pain for functional endurance required or work and pt's personal goal of personal exercise (05/12/2016)   Baseline standing/walking 1.5 hours with 3/10 pain   Time 6  Period Weeks   Status Partially Met     PT LONG TERM GOAL #5   Title she will increase her FOTO score to </= 46% limited to demontrate improvement at discharge (05/12/2016)   Baseline 40% limited on 12/7    Time 6   Period Weeks   Status On-going               Plan - 05/14/16 1245    Clinical Impression Statement Pt is concerned about lack of full knee extension and would like to continue working on ROM. We focused joint mobilizations and extension exercises today with pt measuring -8 extenison at end of treatment. She was able to walk for shopping 1.5 hours this morning  however knee is very sore now. She is tender peripatellar and patella tendon. KT tape applied for knee support and patella tendon relief. Pt reports knee feels much better after.    PT Next Visit Plan continue [place] PRN ;  knee mobs extension with external rotation , AROM/ PROM, bike vs Nu-step Vaso for swelling/ pain,  step-ups/ down, resisted gait walking,    PT Home Exercise Plan heel slides with strap, clam shell, heel raises, quad set, gait with exaggerated heel strike and toe off, prone hangs       Patient will benefit from skilled therapeutic intervention in order to improve the following deficits and impairments:  Abnormal gait, Pain, Improper body mechanics, Increased edema, Decreased strength, Decreased range of motion, Increased fascial restricitons, Hypomobility, Decreased endurance, Decreased activity tolerance, Difficulty walking  Visit Diagnosis: Chronic pain of right knee  Localized edema  Stiffness of right knee, not elsewhere classified  Muscle weakness (generalized)  Other abnormalities of gait and mobility     Problem List Patient Active Problem List   Diagnosis Date Noted  . S/P total knee arthroplasty 03/12/2016  . Obesity (BMI 35.0-39.9 without comorbidity) 06/13/2015  . Elevated BP 06/06/2015  . Elevated LDL cholesterol level 08/17/2014  . Breast cancer of upper-inner quadrant of right female breast ([place]) 02/28/2014  . DJD (degenerative joint disease) of knee 06/29/2013  . Paroxysmal supraventricular tachycardia ([place]) 05/09/2013  . Fatigue 05/09/2013  . Screening for hypercholesterolemia 05/09/2013  . OSA (obstructive sleep apnea)   . Obesity (BMI 30-39.9)     Dorene Ar, [place] 05/14/2016, 1:02 PM  [facility] [address] [place], [place], [address] Phone: [phone]   Fax:  [phone]  Name: RASHELLE IRELAND MRN: [id number] Date of Birth: [birth year]   Starr Lake PT, DPT,  LAT, ATC  05/14/16  1:22 PM

## 2016-05-14 NOTE — Addendum Note (Signed)
Addended by: Larey Days on: 05/14/2016 01:22 PM   Modules accepted: Orders

## 2016-05-21 ENCOUNTER — Ambulatory Visit: Payer: 59 | Admitting: Physical Therapy

## 2016-05-21 DIAGNOSIS — M25561 Pain in right knee: Principal | ICD-10-CM

## 2016-05-21 DIAGNOSIS — R2689 Other abnormalities of gait and mobility: Secondary | ICD-10-CM

## 2016-05-21 DIAGNOSIS — R600 Localized edema: Secondary | ICD-10-CM | POA: Diagnosis not present

## 2016-05-21 DIAGNOSIS — G8929 Other chronic pain: Secondary | ICD-10-CM

## 2016-05-21 DIAGNOSIS — M179 Osteoarthritis of knee, unspecified: Secondary | ICD-10-CM | POA: Diagnosis not present

## 2016-05-21 DIAGNOSIS — M6281 Muscle weakness (generalized): Secondary | ICD-10-CM

## 2016-05-21 DIAGNOSIS — M25661 Stiffness of right knee, not elsewhere classified: Secondary | ICD-10-CM

## 2016-05-21 NOTE — Therapy (Signed)
[facility] [address] [place], [place], [address] Phone: [phone]   Fax:  [phone]  Physical Therapy Treatment  Patient Details  Name: Pamela Underwood MRN: [id number] Date of Birth: 24-Jul-1955 Referring Provider: Mckinley Jewel MD  Encounter Date: 05/21/2016      PT End of Session - 05/21/16 1235    Visit Number 13   Number of Visits 20   Date for PT Re-Evaluation 06/18/16   Authorization Type [facility] UMR   PT Start Time 1147   PT Stop Time 1240   PT Time Calculation (min) 53 min      Past Medical History:  Diagnosis Date  . Arthritis   . Atrial tachycardia (HCC)    takes rythmol daily and cardiazem daily  . Back pain    occasionally  . Breast cancer (HCC) 02/22/14   right upper inner  . Chronic anxiety    takes Xanax daily as needed when heart rate going up  . Colitis    hx-one  . Dysrhythmia    atrial tachycardia- takes meds and controls it  . Elevated BP 06/06/2015  . Insomnia    takes Ambien nightly as needed  . Joint pain   . Joint swelling   . S/P radiation therapy 03/28/2014 through 04/27/2014                                03/28/2014 through 04/27/2014                                                                         Right breast 4250 cGy 17 sessions, right breast boost 1000 cGy in 4 sessions (hypofractionated)                         . Sleep apnea    uses a CPAP    Past Surgical History:  Procedure Laterality Date  . BREAST BIOPSY Left 05/02/1999   benign  . BREAST LUMPECTOMY WITH AXILLARY LYMPH NODE BIOPSY Right 02/22/14   upper inner  . COLONOSCOPY    . CYSTOSCOPY    . KNEE ARTHROSCOPY WITH MEDIAL MENISECTOMY Left 03/31/2013   Procedure: LEFT KNEE ARTHROSCOPY WITH PARTIAL MEDIAL MENISECTOMY, CHONDROPLASTY OF PATELLA  FEMORAL JOINT, EXCISION OF MEDIAL PLICA, PARTIAL LATERAL MENISECTOMY;  Surgeon: Loreta Ave, MD;  Location: [facility];  Service: Orthopedics;  Laterality:  Left;  . TOTAL KNEE ARTHROPLASTY Left 06/29/2013   Procedure: TOTAL KNEE ARTHROPLASTY;  Surgeon: Loreta Ave, MD;  Location: [facility] OR;  Service: Orthopedics;  Laterality: Left;  . TOTAL KNEE ARTHROPLASTY Right 03/12/2016  . TOTAL KNEE ARTHROPLASTY Right 03/12/2016   Procedure: RIGHT TOTAL KNEE ARTHROPLASTY;  Surgeon: Loreta Ave, MD;  Location: [facility] OR;  Service: Orthopedics;  Laterality: Right;  . TUBAL LIGATION      There were no vitals filed for this visit.      Subjective Assessment - 05/21/16 1234    Subjective It's sore. I go back to work in 3 weeks    Currently in Pain? Yes   Pain Score 3    Pain Location Knee   Pain  Orientation Right;Medial   Pain Descriptors / Indicators Sore   Aggravating Factors  sleep position, prolonged standing    Pain Relieving Factors rest, ice                         [facility] Adult PT Treatment/Exercise - 05/21/16 0001      Knee/Hip Exercises: Stretches   Active Hamstring Stretch 3 reps;30 seconds  with strap     Knee/Hip Exercises: Aerobic   Recumbent Bike L1 x 7 minutes      Knee/Hip Exercises: Supine   Terminal Knee Extension 20 reps  foot on bolster     Cryotherapy   Number Minutes Cryotherapy 10 Minutes   Cryotherapy Location Knee   Type of Cryotherapy Ice pack     Manual Therapy   Joint Mobilization A/P mobs and P/A mobs grade II  , patella mobs grade 2 inferior/ superior   Soft tissue mobilization IASTM over the medial knee and distal quads and hamstrings                   PT Short Term Goals - 05/07/16 1154      PT SHORT TERM GOAL #1   Title 8-   Time 3   Status Achieved     PT SHORT TERM GOAL #2   Title pt will improve R knee flexion to >/= 105 degrees and extension to </= -6 degrees with </= 4/10 pain for functional progression (04/21/2016)   Baseline 8-112   Time 3   Period Weeks   Status Partially Met     PT SHORT TERM GOAL #3   Title pt will be able to walk/ stand for >/= 15 min with  no AD with </= 4/10 pain to assist with unaided ambulation to promote funcitonal mobility (04/21/2016)   Baseline vacuming 10 minutes, grocerry strore 15 minutes up to 3/10 pain   Time 3   Period Weeks   Status Achieved           PT Long Term Goals - 05/14/16 1251      PT LONG TERM GOAL #1   Title she will be I with all HEP given as of last visit (05/12/2016)   Time 6   Period Weeks   Status On-going     PT LONG TERM GOAL #2   Title pt will improve her R knee flexion to >/= 120 degrees and extension to </= -4 degrees  with </= 2/10 pain for functional and efficient gait pattern (05/12/2016)   Baseline 8-112   Time 6   Period Weeks   Status Partially Met     PT LONG TERM GOAL #3   Title she will increase her RLE strength to >/= 4+/5 with </= 2/10 pain for prolonged walking/ standing activities (05/12/2016)   Baseline 4/5 right knee   Period Weeks   Status Partially Met     PT LONG TERM GOAL #4   Title pt will be able to sit/ stand and walk >/= 45 min with no AD with </= 2/10 pain for functional endurance required or work and pt's personal goal of personal exercise (05/12/2016)   Baseline standing/walking 1.5 hours with 3/10 pain   Time 6   Period Weeks   Status Partially Met     PT LONG TERM GOAL #5   Title she will increase her FOTO score to </= 46% limited to demontrate improvement at discharge (05/12/2016)   Baseline 40% limited  on 12/7    Time 6   Period Weeks   Status On-going               Plan - 05/21/16 1237    Clinical Impression Statement Manual soft tissue work to hamstrings as well and extension mobs and hamstrings. Pt is also tight and tender in medial quad/ adductors. After treatment pt feels less sore. She continues to lack full extension.    PT Next Visit Plan continue [place] PRN ;  knee mobs extension with external rotation , AROM/ PROM, bike vs Nu-step Vaso for swelling/ pain,  step-ups/ down, resisted gait walking,    PT Home Exercise Plan  heel slides with strap, clam shell, heel raises, quad set, gait with exaggerated heel strike and toe off, prone hangs    Consulted and Agree with Plan of Care Patient      Patient will benefit from skilled therapeutic intervention in order to improve the following deficits and impairments:  Abnormal gait, Pain, Improper body mechanics, Increased edema, Decreased strength, Decreased range of motion, Increased fascial restricitons, Hypomobility, Decreased endurance, Decreased activity tolerance, Difficulty walking  Visit Diagnosis: Chronic pain of right knee  Localized edema  Stiffness of right knee, not elsewhere classified  Muscle weakness (generalized)  Other abnormalities of gait and mobility     Problem List Patient Active Problem List   Diagnosis Date Noted  . S/P total knee arthroplasty 03/12/2016  . Obesity (BMI 35.0-39.9 without comorbidity) 06/13/2015  . Elevated BP 06/06/2015  . Elevated LDL cholesterol level 08/17/2014  . Breast cancer of upper-inner quadrant of right female breast ([place]) 02/28/2014  . DJD (degenerative joint disease) of knee 06/29/2013  . Paroxysmal supraventricular tachycardia ([place]) 05/09/2013  . Fatigue 05/09/2013  . Screening for hypercholesterolemia 05/09/2013  . OSA (obstructive sleep apnea)   . Obesity (BMI 30-39.9)     Dorene Ar, [place] 05/21/2016, 12:54 PM  [facility] [address] [place], [place], [address] Phone: [phone]   Fax:  [phone]  Name: SIRINITY OUTLAND MRN: [id number] Date of Birth: [birth year]

## 2016-05-23 ENCOUNTER — Ambulatory Visit: Payer: 59 | Admitting: Physical Therapy

## 2016-05-23 DIAGNOSIS — M25561 Pain in right knee: Secondary | ICD-10-CM | POA: Diagnosis not present

## 2016-05-23 DIAGNOSIS — R600 Localized edema: Secondary | ICD-10-CM

## 2016-05-23 DIAGNOSIS — G8929 Other chronic pain: Secondary | ICD-10-CM

## 2016-05-23 DIAGNOSIS — M6281 Muscle weakness (generalized): Secondary | ICD-10-CM

## 2016-05-23 DIAGNOSIS — R2689 Other abnormalities of gait and mobility: Secondary | ICD-10-CM

## 2016-05-23 DIAGNOSIS — M179 Osteoarthritis of knee, unspecified: Secondary | ICD-10-CM | POA: Diagnosis not present

## 2016-05-23 DIAGNOSIS — M25661 Stiffness of right knee, not elsewhere classified: Secondary | ICD-10-CM

## 2016-05-23 NOTE — Therapy (Signed)
[place], [place], [address] Phone: [phone]   Fax:  [phone]  Physical Therapy Treatment  Patient Details  Name: Pamela [facility] MRN: [id number] Date of Birth: 01-16-1956 Referring Provider: Kathryne Hitch MD  Encounter Date: 05/23/2016      PT End of Session - 05/23/16 1207    Visit Number 14   Number of Visits 20   Date for PT Re-Evaluation 06/18/16   Authorization Type [facility] UMR   PT Start Time 2409   PT Stop Time 7353   PT Time Calculation (min) 57 min      Past Medical History:  Diagnosis Date  . Arthritis   . Atrial tachycardia ([place])    takes rythmol daily and cardiazem daily  . Back pain    occasionally  . Breast cancer ([place]) 02/22/14   right upper inner  . Chronic anxiety    takes Xanax daily as needed when heart rate going up  . Colitis    hx-one  . Dysrhythmia    atrial tachycardia- takes meds and controls it  . Elevated BP 06/06/2015  . Insomnia    takes Ambien nightly as needed  . Joint pain   . Joint swelling   . S/P radiation therapy 03/28/2014 through 04/27/2014                                03/28/2014 through 04/27/2014                                                                         Right breast 4250 cGy 17 sessions, right breast boost 1000 cGy in 4 sessions (hypofractionated)                         . Sleep apnea    uses a CPAP    Past Surgical History:  Procedure Laterality Date  . BREAST BIOPSY Left 05/02/1999   benign  . BREAST LUMPECTOMY WITH AXILLARY LYMPH NODE BIOPSY Right 02/22/14   upper inner  . COLONOSCOPY    . CYSTOSCOPY    . KNEE ARTHROSCOPY WITH MEDIAL MENISECTOMY Left 03/31/2013   Procedure: LEFT KNEE ARTHROSCOPY WITH PARTIAL MEDIAL MENISECTOMY, CHONDROPLASTY OF PATELLA  FEMORAL JOINT, EXCISION OF MEDIAL PLICA, PARTIAL LATERAL MENISECTOMY;  Surgeon: [name], MD;  Location: [place];  Service: Orthopedics;  Laterality:  Left;  . TOTAL KNEE ARTHROPLASTY Left 06/29/2013   Procedure: TOTAL KNEE ARTHROPLASTY;  Surgeon: [name], MD;  Location: [place];  Service: Orthopedics;  Laterality: Left;  . TOTAL KNEE ARTHROPLASTY Right 03/12/2016  . TOTAL KNEE ARTHROPLASTY Right 03/12/2016   Procedure: RIGHT TOTAL KNEE ARTHROPLASTY;  Surgeon: [name], MD;  Location: [place];  Service: Orthopedics;  Laterality: Right;  . TUBAL LIGATION      There were no vitals filed for this visit.      Subjective Assessment - 05/23/16 1152    Subjective The massage helped a lot.    Currently in Pain? Yes   Pain Score 2    Pain Location Knee   Pain Orientation Right;Medial   Pain  Descriptors / Indicators Sore   Aggravating Factors  sleep position   Pain Relieving Factors rest, ice            [facility] PT Assessment - 05/23/16 0001      AROM   Right Knee Extension -9   Right Knee Flexion 115                     [facility] Adult PT Treatment/Exercise - 05/23/16 0001      Knee/Hip Exercises: Aerobic   Recumbent Bike L2 x 7 minutes      Knee/Hip Exercises: Machines for Strengthening   Cybex Knee Flexion 55# bilateral , 25 # single      Knee/Hip Exercises: Standing   Terminal Knee Extension Limitations Terminal knee extension green band 3 way x 10 each   and ball on wall x 20    Lateral Step Up 20 reps;Hand Hold: 1;Step Height: 6"   Forward Step Up 20 reps;Hand Hold: 1;Step Height: 6"   Step Down 10 reps;Hand Hold: 1;Step Height: 4"   Wall Squat 20 reps   SLS 30 seconds   x 2    Gait Training exagerrated heel strike to promote gait efficiency 2 x 35 ft     Knee/Hip Exercises: Supine   Quad Sets 20 reps   Short Arc [employer] 20 reps   Short Arc Quad Sets Limitations 5#   Terminal Knee Extension 20 reps  foot on towel    Straight Leg Raises 10 reps   Straight Leg Raises Limitations with initial quad set, quad      Cryotherapy   Number Minutes Cryotherapy 10 Minutes   Cryotherapy Location Knee    Type of Cryotherapy Ice pack     Manual Therapy   Joint Mobilization A/P mobs and P/A mobs grade II  , patella mobs grade 2 inferior/ superior   Soft tissue mobilization --                PT Education - 05/23/16 1206    Education provided Yes   Education Details AROM    Person(s) Educated Patient   Methods Explanation;Handout   Comprehension Verbalized understanding          PT Short Term Goals - 05/07/16 1154      PT SHORT TERM GOAL #1   Title 8-   Time 3   Status Achieved     PT SHORT TERM GOAL #2   Title pt will improve R knee flexion to >/= 105 degrees and extension to </= -6 degrees with </= 4/10 pain for functional progression (04/21/2016)   Baseline 8-112   Time 3   Period Weeks   Status Partially Met     PT SHORT TERM GOAL #3   Title pt will be able to walk/ stand for >/= 15 min with no AD with </= 4/10 pain to assist with unaided ambulation to promote funcitonal mobility (04/21/2016)   Baseline vacuming 10 minutes, grocerry strore 15 minutes up to 3/10 pain   Time 3   Period Weeks   Status Achieved           PT Long Term Goals - 05/14/16 1251      PT LONG TERM GOAL #1   Title she will be I with all HEP given as of last visit (05/12/2016)   Time 6   Period Weeks   Status On-going     PT LONG TERM GOAL #2   Title pt  will improve her R knee flexion to >/= 120 degrees and extension to </= -4 degrees  with </= 2/10 pain for functional and efficient gait pattern (05/12/2016)   Baseline 8-112   Time 6   Period Weeks   Status Partially Met     PT LONG TERM GOAL #3   Title she will increase her RLE strength to >/= 4+/5 with </= 2/10 pain for prolonged walking/ standing activities (05/12/2016)   Baseline 4/5 right knee   Period Weeks   Status Partially Met     PT LONG TERM GOAL #4   Title pt will be able to sit/ stand and walk >/= 45 min with no AD with </= 2/10 pain for functional endurance required or work and pt's personal goal of  personal exercise (05/12/2016)   Baseline standing/walking 1.5 hours with 3/10 pain   Time 6   Period Weeks   Status Partially Met     PT LONG TERM GOAL #5   Title she will increase her FOTO score to </= 46% limited to demontrate improvement at discharge (05/12/2016)   Baseline 40% limited on 12/7    Time 6   Period Weeks   Status On-going               Plan - 05/23/16 1243    Clinical Impression Statement Worked on functional strength today with pt demonstrating increased tolerance to closed chain strength. She reports the soft tissue work was helpful and she is less tender upon palpation today around anterior knee.    PT Next Visit Plan continue [place] PRN ;  knee mobs extension with external rotation , AROM/ PROM, bike vs Nu-step Vaso for swelling/ pain,  step-ups/ down, resisted gait walking,    PT Home Exercise Plan heel slides with strap, clam shell, heel raises, quad set, gait with exaggerated heel strike and toe off, prone hangs    Consulted and Agree with Plan of Care Patient      Patient will benefit from skilled therapeutic intervention in order to improve the following deficits and impairments:  Abnormal gait, Pain, Improper body mechanics, Increased edema, Decreased strength, Decreased range of motion, Increased fascial restricitons, Hypomobility, Decreased endurance, Decreased activity tolerance, Difficulty walking  Visit Diagnosis: Chronic pain of right knee  Localized edema  Stiffness of right knee, not elsewhere classified  Muscle weakness (generalized)  Other abnormalities of gait and mobility     Problem List Patient Active Problem List   Diagnosis Date Noted  . S/P total knee arthroplasty 03/12/2016  . Obesity (BMI 35.0-39.9 without comorbidity) 06/13/2015  . Elevated BP 06/06/2015  . Elevated LDL cholesterol level 08/17/2014  . Breast cancer of upper-inner quadrant of right female breast ([place]) 02/28/2014  . DJD (degenerative joint disease)  of knee 06/29/2013  . Paroxysmal supraventricular tachycardia ([place]) 05/09/2013  . Fatigue 05/09/2013  . Screening for hypercholesterolemia 05/09/2013  . OSA (obstructive sleep apnea)   . Obesity (BMI 30-39.9)     Dorene Ar, [place] 05/23/2016, 12:58 PM  [facility] [address] [place], [place], [address] Phone: [phone]   Fax:  [phone]  Name: Pamela [facility] MRN: [id number] Date of Birth: [birth year]

## 2016-05-23 NOTE — Patient Instructions (Addendum)
Terminal Knee Extension (Standing)    Facing anchor with right knee slightly bent and tubing just above knee, gently pull knee back straight. Do not overextend knee. Repeat 10____ times per set. Do _2___ sets per session. Do ___2_ sessions per day.  Perform with right foot forward, perform with right foot back  [email or web address]://[email or web address].exer.[email or web address]   Copyright  VHI. All rights reserved.

## 2016-05-27 ENCOUNTER — Encounter: Payer: Self-pay | Admitting: Oncology

## 2016-05-27 ENCOUNTER — Ambulatory Visit: Payer: 59 | Admitting: Oncology

## 2016-05-27 DIAGNOSIS — M1711 Unilateral primary osteoarthritis, right knee: Secondary | ICD-10-CM | POA: Diagnosis not present

## 2016-05-27 MED FILL — HYDROCODON-APAP 5-325: 5-325 | 15 days supply | Qty: 60 | Fill #0

## 2016-05-28 ENCOUNTER — Ambulatory Visit: Payer: 59 | Attending: Orthopedic Surgery | Admitting: Physical Therapy

## 2016-05-28 DIAGNOSIS — M25561 Pain in right knee: Secondary | ICD-10-CM | POA: Diagnosis not present

## 2016-05-28 DIAGNOSIS — R2689 Other abnormalities of gait and mobility: Secondary | ICD-10-CM | POA: Diagnosis not present

## 2016-05-28 DIAGNOSIS — M6281 Muscle weakness (generalized): Secondary | ICD-10-CM | POA: Diagnosis not present

## 2016-05-28 DIAGNOSIS — G8929 Other chronic pain: Secondary | ICD-10-CM | POA: Insufficient documentation

## 2016-05-28 DIAGNOSIS — M25661 Stiffness of right knee, not elsewhere classified: Secondary | ICD-10-CM | POA: Insufficient documentation

## 2016-05-28 DIAGNOSIS — R600 Localized edema: Secondary | ICD-10-CM | POA: Insufficient documentation

## 2016-05-28 NOTE — Therapy (Signed)
[place], [place], [address] Phone: [phone]   Fax:  [phone]  Physical Therapy Treatment  Patient Details  Name: Pamela [facility] MRN: [id number] Date of Birth: 1956/04/25 Referring Provider: Kathryne Hitch MD  Encounter Date: 05/28/2016      PT End of Session - 05/28/16 1327    Visit Number 15   Number of Visits 20   Date for PT Re-Evaluation 06/18/16   Authorization Type [facility] UMR   PT Start Time 2423   PT Stop Time 1238   PT Time Calculation (min) 53 min   Activity Tolerance Patient tolerated treatment well   Behavior During Therapy [facility] for tasks assessed/performed      Past Medical History:  Diagnosis Date  . Arthritis   . Atrial tachycardia ([place])    takes rythmol daily and cardiazem daily  . Back pain    occasionally  . Breast cancer ([place]) 02/22/14   right upper inner  . Chronic anxiety    takes Xanax daily as needed when heart rate going up  . Colitis    hx-one  . Dysrhythmia    atrial tachycardia- takes meds and controls it  . Elevated BP 06/06/2015  . Insomnia    takes Ambien nightly as needed  . Joint pain   . Joint swelling   . S/P radiation therapy 03/28/2014 through 04/27/2014                                03/28/2014 through 04/27/2014                                                                         Right breast 4250 cGy 17 sessions, right breast boost 1000 cGy in 4 sessions (hypofractionated)                         . Sleep apnea    uses a CPAP    Past Surgical History:  Procedure Laterality Date  . BREAST BIOPSY Left 05/02/1999   benign  . BREAST LUMPECTOMY WITH AXILLARY LYMPH NODE BIOPSY Right 02/22/14   upper inner  . COLONOSCOPY    . CYSTOSCOPY    . KNEE ARTHROSCOPY WITH MEDIAL MENISECTOMY Left 03/31/2013   Procedure: LEFT KNEE ARTHROSCOPY WITH PARTIAL MEDIAL MENISECTOMY, CHONDROPLASTY OF PATELLA  FEMORAL JOINT, EXCISION OF MEDIAL PLICA, PARTIAL LATERAL  MENISECTOMY;  Surgeon: [name], MD;  Location: [place];  Service: Orthopedics;  Laterality: Left;  . TOTAL KNEE ARTHROPLASTY Left 06/29/2013   Procedure: TOTAL KNEE ARTHROPLASTY;  Surgeon: [name], MD;  Location: [place];  Service: Orthopedics;  Laterality: Left;  . TOTAL KNEE ARTHROPLASTY Right 03/12/2016  . TOTAL KNEE ARTHROPLASTY Right 03/12/2016   Procedure: RIGHT TOTAL KNEE ARTHROPLASTY;  Surgeon: [name], MD;  Location: [place];  Service: Orthopedics;  Laterality: Right;  . TUBAL LIGATION      There were no vitals filed for this visit.      Subjective Assessment - 05/28/16 1147    Subjective "I am going pretty, I saw the Md and he said continued weakness and  limited extension.    Currently in Pain? Yes   Pain Score 2    Pain Location Knee   Pain Orientation Right;Medial   Pain Descriptors / Indicators Sore;Aching   Pain Type Surgical pain   Pain Onset More than a month ago   Pain Frequency Intermittent   Aggravating Factors  sleeping   Pain Relieving Factors rest, ice            [facility] PT Assessment - 05/28/16 0001      AROM   Right Knee Extension -5   Right Knee Flexion 102                     [facility] Adult PT Treatment/Exercise - 05/28/16 0001      Knee/Hip Exercises: Aerobic   Recumbent Bike (P)  L3 x 6 min  lowering the seat every 2 min     Knee/Hip Exercises: Machines for Strengthening   Cybex Knee Extension (P)  2 x 10 , 1 set with with 25#, 1 set 20#. concentric with both and eccentric with RLE only     Modalities   Modalities (P)  Moist Heat     Moist Heat Therapy   Number Minutes Moist Heat (P)  10 Minutes   Moist Heat Location (P)  Knee     Acupuncturist Stimulation Location Right Market researcher [place]   Electrical Stimulation Parameters ramp 2 sec, frequency 60 BPS, Level 43, x 10 min   Electrical Stimulation Goals Neuromuscular facilitation;Strength   performing SAQ with E-stim     Manual Therapy   Kinesiotex (P)  Create Space;Edema     Kinesiotix   Edema (P)  R knee                  PT Short Term Goals - 05/07/16 1154      PT SHORT TERM GOAL #1   Title 8-   Time 3   Status Achieved     PT SHORT TERM GOAL #2   Title pt will improve R knee flexion to >/= 105 degrees and extension to </= -6 degrees with </= 4/10 pain for functional progression (04/21/2016)   Baseline 8-112   Time 3   Period Weeks   Status Partially Met     PT SHORT TERM GOAL #3   Title pt will be able to walk/ stand for >/= 15 min with no AD with </= 4/10 pain to assist with unaided ambulation to promote funcitonal mobility (04/21/2016)   Baseline vacuming 10 minutes, grocerry strore 15 minutes up to 3/10 pain   Time 3   Period Weeks   Status Achieved           PT Long Term Goals - 05/14/16 1251      PT LONG TERM GOAL #1   Title she will be I with all HEP given as of last visit (05/12/2016)   Time 6   Period Weeks   Status On-going     PT LONG TERM GOAL #2   Title pt will improve her R knee flexion to >/= 120 degrees and extension to </= -4 degrees  with </= 2/10 pain for functional and efficient gait pattern (05/12/2016)   Baseline 8-112   Time 6   Period Weeks   Status Partially Met     PT LONG TERM GOAL #3   Title she will increase her RLE strength to >/= 4+/5 with </= 2/10  pain for prolonged walking/ standing activities (05/12/2016)   Baseline 4/5 right knee   Period Weeks   Status Partially Met     PT LONG TERM GOAL #4   Title pt will be able to sit/ stand and walk >/= 45 min with no AD with </= 2/10 pain for functional endurance required or work and pt's personal goal of personal exercise (05/12/2016)   Baseline standing/walking 1.5 hours with 3/10 pain   Time 6   Period Weeks   Status Partially Met     PT LONG TERM GOAL #5   Title she will increase her FOTO score to </= 46% limited to demontrate improvement at  discharge (05/12/2016)   Baseline 40% limited on 12/7    Time 6   Period Weeks   Status On-going             Patient will benefit from skilled therapeutic intervention in order to improve the following deficits and impairments:     Visit Diagnosis: Chronic pain of right knee  Localized edema  Stiffness of right knee, not elsewhere classified  Muscle weakness (generalized)  Other abnormalities of gait and mobility     Problem List Patient Active Problem List   Diagnosis Date Noted  . S/P total knee arthroplasty 03/12/2016  . Obesity (BMI 35.0-39.9 without comorbidity) 06/13/2015  . Elevated BP 06/06/2015  . Elevated LDL cholesterol level 08/17/2014  . Breast cancer of upper-inner quadrant of right female breast ([place]) 02/28/2014  . DJD (degenerative joint disease) of knee 06/29/2013  . Paroxysmal supraventricular tachycardia ([place]) 05/09/2013  . Fatigue 05/09/2013  . Screening for hypercholesterolemia 05/09/2013  . OSA (obstructive sleep apnea)   . Obesity (BMI 30-39.9)    Starr Lake PT, DPT, LAT, ATC  05/28/16  1:32 PM      [facility] [address] [place], [place], [address] Phone: [phone]   Fax:  [phone]  Name: Pamela [facility] MRN: [id number] Date of Birth: [birth year]

## 2016-05-30 MED FILL — ALPRAZolam 0.25 MG TABS: 0.25 | 30 days supply | Qty: 30 | Fill #1

## 2016-05-30 MED FILL — ZOLPIDEM TARTRATE 10 MG TAB: 10 | 30 days supply | Qty: 30 | Fill #1

## 2016-06-02 ENCOUNTER — Ambulatory Visit: Payer: 59 | Admitting: Physical Therapy

## 2016-06-02 DIAGNOSIS — G8929 Other chronic pain: Secondary | ICD-10-CM

## 2016-06-02 DIAGNOSIS — R2689 Other abnormalities of gait and mobility: Secondary | ICD-10-CM

## 2016-06-02 DIAGNOSIS — M25561 Pain in right knee: Principal | ICD-10-CM

## 2016-06-02 DIAGNOSIS — M25661 Stiffness of right knee, not elsewhere classified: Secondary | ICD-10-CM | POA: Diagnosis not present

## 2016-06-02 DIAGNOSIS — M6281 Muscle weakness (generalized): Secondary | ICD-10-CM | POA: Diagnosis not present

## 2016-06-02 DIAGNOSIS — R600 Localized edema: Secondary | ICD-10-CM | POA: Diagnosis not present

## 2016-06-02 NOTE — Therapy (Signed)
[facility] [address] [place], [place], [address] Phone: [phone]   Fax:  [phone]  Physical Therapy Treatment  Patient Details  Name: Pamela Under[facility] MRN: [id number] Date of Birth: 1955-12-30 Referring Provider: Mckinley Jewel MD  Encounter Date: 06/02/2016      PT End of Session - 06/02/16 1236    Visit Number 16   Number of Visits 20   Date for PT Re-Evaluation 06/18/16   PT Start Time 1146   PT Stop Time 1238   PT Time Calculation (min) 52 min   Activity Tolerance Patient tolerated treatment well   Behavior During Therapy Pamela [facility]wood for tasks assessed/performed      Past Medical History:  Diagnosis Date  . Arthritis   . Atrial tachycardia ([facility])    takes rythmol daily and cardiazem daily  . Back pain    occasionally  . Breast cancer ([facility]) 02/22/14   right upper inner  . Chronic anxiety    takes Xanax daily as needed when heart rate going up  . Colitis    hx-one  . Dysrhythmia    atrial tachycardia- takes meds and controls it  . Elevated BP 06/06/2015  . Insomnia    takes Ambien nightly as needed  . Joint pain   . Joint swelling   . S/P radiation therapy 03/28/2014 through 04/27/2014                                03/28/2014 through 04/27/2014                                                                         Right breast 4250 cGy 17 sessions, right breast boost 1000 cGy in 4 sessions (hypofractionated)                         . Sleep apnea    uses a CPAP    Past Surgical History:  Procedure Laterality Date  . BREAST BIOPSY Left 05/02/1999   benign  . BREAST LUMPECTOMY WITH AXILLARY LYMPH NODE BIOPSY Right 02/22/14   upper inner  . COLONOSCOPY    . CYSTOSCOPY    . KNEE ARTHROSCOPY WITH MEDIAL MENISECTOMY Left 03/31/2013   Procedure: LEFT KNEE ARTHROSCOPY WITH PARTIAL MEDIAL MENISECTOMY, CHONDROPLASTY OF PATELLA  FEMORAL JOINT, EXCISION OF MEDIAL PLICA, PARTIAL LATERAL MENISECTOMY;  Surgeon: Pamela Ave, MD;  Location: [facility];  Service: Orthopedics;  Laterality: Left;  . TOTAL KNEE ARTHROPLASTY Left 06/29/2013   Procedure: TOTAL KNEE ARTHROPLASTY;  Surgeon: Pamela Ave, MD;  [facility]: [facility];  Service: Orthopedics;  Laterality: Left;  . TOTAL KNEE ARTHROPLASTY Right 03/12/2016  . TOTAL KNEE ARTHROPLASTY Right 03/12/2016   Procedure: RIGHT TOTAL KNEE ARTHROPLASTY;  Surgeon: Pamela Ave, MD;  Location: [facility];  Service: Orthopedics;  Laterality: Right;  . TUBAL LIGATION      There were no vitals filed for this visit.      Subjective Assessment - 06/02/16 1152    Subjective "I am feeling more stiff with the flexion. I was a little sore after the last session"   Currently  in Pain? Yes   Pain Score 1    Pain Orientation Right;Medial            [facility] PT Assessment - 06/02/16 0001      AROM   Right Knee Extension -7   Right Knee Flexion 108                     [facility] Adult PT Treatment/Exercise - 06/02/16 0001      Knee/Hip Exercises: Stretches   Active Hamstring Stretch 30 seconds;2 reps  strap   Quad Stretch 3 reps;30 seconds  in prone     Knee/Hip Exercises: Aerobic   Recumbent Bike L6 x 6 min  LE only     Knee/Hip Exercises: Machines for Strengthening   Cybex Knee Extension 2 x 10 20# concentric with both and eccentric with RLE   Total Gym Leg Press 1 x 10 23#, 2 x 10 35#     Moist Heat Therapy   Number Minutes Moist Heat 10 Minutes   Moist Heat Location Knee             Balance Exercises - 06/02/16 1219      Balance Exercises: Standing   Standing Eyes Opened Narrow base of support (BOS);Wide (BOA);Head turns;4 reps;30 secs;2 reps   Standing Eyes Closed Narrow base of support (BOS);Wide (BOA);2 reps;30 secs;4 reps   Tandem Stance Eyes open;Eyes closed;4 reps;30 secs             PT Short Term Goals - 05/07/16 1154      PT SHORT TERM GOAL #1   Title 8-   Time 3   Status Achieved     PT SHORT TERM GOAL  #2   Title pt will improve R knee flexion to >/= 105 degrees and extension to </= -6 degrees with </= 4/10 pain for functional progression (04/21/2016)   Baseline 8-112   Time 3   Period Weeks   Status Partially Met     PT SHORT TERM GOAL #3   Title pt will be able to walk/ stand for >/= 15 min with no AD with </= 4/10 pain to assist with unaided ambulation to promote funcitonal mobility (04/21/2016)   Baseline vacuming 10 minutes, grocerry strore 15 minutes up to 3/10 pain   Time 3   Period Weeks   Status Achieved           PT Long Term Goals - 05/14/16 1251      PT LONG TERM GOAL #1   Title she will be I with all HEP given as of last visit (05/12/2016)   Time 6   Period Weeks   Status On-going     PT LONG TERM GOAL #2   Title pt will improve her R knee flexion to >/= 120 degrees and extension to </= -4 degrees  with </= 2/10 pain for functional and efficient gait pattern (05/12/2016)   Baseline 8-112   Time 6   Period Weeks   Status Partially Met     PT LONG TERM GOAL #3   Title she will increase her RLE strength to >/= 4+/5 with </= 2/10 pain for prolonged walking/ standing activities (05/12/2016)   Baseline 4/5 right knee   Period Weeks   Status Partially Met     PT LONG TERM GOAL #4   Title pt will be able to sit/ stand and walk >/= 45 min with no AD with </= 2/10 pain for functional endurance required  [place] work and pt's personal goal of personal exercise (05/12/2016)   Baseline standing/walking 1.5 hours with 3/10 pain   Time 6   Period Weeks   Status Partially Met     PT LONG TERM GOAL #5   Title she will increase her FOTO score to </= 46% limited to demontrate improvement at discharge (05/12/2016)   Baseline 40% limited on 12/7    Time 6   Period Weeks   Status On-going               Plan - 06/02/16 1236    Clinical Impression Statement Mrs. Pamela Underwood reports continued stiffness. she demonstrates improvement in flexion compared to previous measure  and -7 with extension. continued quad and glute strengthening which she perofrmed well. also addressed balance which she demonstrated minimal postural sway with narrow BOS with eyes open and closed.    PT Next Visit Plan continue [place] PRN ;  knee mobs extension with external rotation , AROM/ PROM, bike vs Nu-step Vaso for swelling/ pain,  step-ups/ down, resisted gait walking, quad strengthening and CKC activities   Consulted and Agree with Plan of Care Patient      Patient will benefit from skilled therapeutic intervention in order to improve the following deficits and impairments:  Abnormal gait, Pain, Improper body mechanics, Increased edema, Decreased strength, Decreased range of motion, Increased fascial restricitons, Hypomobility, Decreased endurance, Decreased activity tolerance, Difficulty walking  Visit Diagnosis: Chronic pain of right knee  Localized edema  Stiffness of right knee, not elsewhere classified  Muscle weakness (generalized)  Other abnormalities of gait and mobility     Problem List Patient Active Problem List   Diagnosis Date Noted  . S/P total knee arthroplasty 03/12/2016  . Obesity (BMI 35.0-39.9 without comorbidity) 06/13/2015  . Elevated BP 06/06/2015  . Elevated LDL cholesterol level 08/17/2014  . Breast cancer of upper-inner quadrant of right female breast ([place]) 02/28/2014  . DJD (degenerative joint disease) of knee 06/29/2013  . Paroxysmal supraventricular tachycardia ([place]) 05/09/2013  . Fatigue 05/09/2013  . Screening for hypercholesterolemia 05/09/2013  . OSA (obstructive sleep apnea)   . Obesity (BMI 30-39.9)    Starr Lake PT, DPT, LAT, ATC  06/02/16  12:41 PM      [facility] [address] [place], [place], [address] Phone: [phone]   Fax:  [phone]  Name: Pamela [facility] MRN: [id number] Date of Birth: [birth year]

## 2016-06-04 ENCOUNTER — Ambulatory Visit: Payer: 59 | Admitting: Physical Therapy

## 2016-06-04 DIAGNOSIS — M25661 Stiffness of right knee, not elsewhere classified: Secondary | ICD-10-CM

## 2016-06-04 DIAGNOSIS — M25561 Pain in right knee: Principal | ICD-10-CM

## 2016-06-04 DIAGNOSIS — R600 Localized edema: Secondary | ICD-10-CM | POA: Diagnosis not present

## 2016-06-04 DIAGNOSIS — G8929 Other chronic pain: Secondary | ICD-10-CM | POA: Diagnosis not present

## 2016-06-04 DIAGNOSIS — R2689 Other abnormalities of gait and mobility: Secondary | ICD-10-CM | POA: Diagnosis not present

## 2016-06-04 DIAGNOSIS — M6281 Muscle weakness (generalized): Secondary | ICD-10-CM

## 2016-06-04 NOTE — Therapy (Signed)
[place], [place], [address] Phone: [phone]   Fax:  [phone]  Physical Therapy Treatment  Patient Details  Name: Pamela Underwood MRN: [id number] Date of Birth: 12-Oct-1955 Referring Provider: Kathryne Hitch MD  Encounter Date: 06/04/2016      PT End of Session - 06/04/16 1305    Visit Number 17   Number of Visits 20   Date for PT Re-Evaluation 06/18/16   Authorization Type [facility]   PT Start Time 2500   PT Stop Time 1243   PT Time Calculation (min) 55 min      Past Medical History:  Diagnosis Date  . Arthritis   . Atrial tachycardia ([place])    takes rythmol daily and cardiazem daily  . Back pain    occasionally  . Breast cancer ([place]) 02/22/14   right upper inner  . Chronic anxiety    takes Xanax daily as needed when heart rate going up  . Colitis    hx-one  . Dysrhythmia    atrial tachycardia- takes meds and controls it  . Elevated BP 06/06/2015  . Insomnia    takes Ambien nightly as needed  . Joint pain   . Joint swelling   . S/P radiation therapy 03/28/2014 through 04/27/2014                                03/28/2014 through 04/27/2014                                                                         Right breast 4250 cGy 17 sessions, right breast boost 1000 cGy in 4 sessions (hypofractionated)                         . Sleep apnea    uses a CPAP    Past Surgical History:  Procedure Laterality Date  . BREAST BIOPSY Left 05/02/1999   benign  . BREAST LUMPECTOMY WITH AXILLARY LYMPH NODE BIOPSY Right 02/22/14   upper inner  . COLONOSCOPY    . CYSTOSCOPY    . KNEE ARTHROSCOPY WITH MEDIAL MENISECTOMY Left 03/31/2013   Procedure: LEFT KNEE ARTHROSCOPY WITH PARTIAL MEDIAL MENISECTOMY, CHONDROPLASTY OF PATELLA  FEMORAL JOINT, EXCISION OF MEDIAL PLICA, PARTIAL LATERAL MENISECTOMY;  Surgeon: [name], MD;  Location: [place];  Service: Orthopedics;  Laterality:  Left;  . TOTAL KNEE ARTHROPLASTY Left 06/29/2013   Procedure: TOTAL KNEE ARTHROPLASTY;  Surgeon: [name], MD;  Location: [place];  Service: Orthopedics;  Laterality: Left;  . TOTAL KNEE ARTHROPLASTY Right 03/12/2016  . TOTAL KNEE ARTHROPLASTY Right 03/12/2016   Procedure: RIGHT TOTAL KNEE ARTHROPLASTY;  Surgeon: [name], MD;  Location: [place];  Service: Orthopedics;  Laterality: Right;  . TUBAL LIGATION      There were no vitals filed for this visit.      Subjective Assessment - 06/04/16 1155    Subjective I was sore yesterday 3/10  but better today 1/10 now.    Currently in Pain? Yes   Pain Score 1    Pain Location Knee  Pain Orientation Right;Medial   Pain Descriptors / Indicators Sore   Aggravating Factors  sleep positions, over doing exercises   Pain Relieving Factors rest, ice                         [facility] Adult PT Treatment/Exercise - 06/04/16 0001      Knee/Hip Exercises: Stretches   Active Hamstring Stretch 30 seconds;2 reps  strap   Quad Stretch 3 reps;30 seconds  in prone   Other Knee/Hip Stretches slant board stretch x 1 minutes     Knee/Hip Exercises: Machines for Strengthening   Cybex Knee Extension 2 x 10 20# concentric with both and eccentric with RLE   Cybex Knee Flexion 45# bilateral with right eccentric   Total Gym Leg Press 1 x 10 23#, 2 x 10 35#     Knee/Hip Exercises: Standing   Heel Raises Right;10 reps   Heel Raises Limitations each foot single leg then bilateral x 10   less rom on right    Terminal Knee Extension Limitations Terminal knee extension green band 3 way x 10 each   and ball on wall x 20    Wall Squat 10 reps     Manual Therapy   Manual Therapy Taping   Soft tissue mobilization Right lateral hamstring, distal    Kinesiotex --  Knee support tape                   PT Short Term Goals - 05/07/16 1154      PT SHORT TERM GOAL #1   Title Independent with initial HEP   Time    Status Achieved      PT SHORT TERM GOAL #2   Title pt will improve R knee flexion to >/= 105 degrees and extension to </= -6 degrees with </= 4/10 pain for functional progression (04/21/2016)   Baseline 8-112   Time 3   Period Weeks   Status Partially Met     PT SHORT TERM GOAL #3   Title pt will be able to walk/ stand for >/= 15 min with no AD with </= 4/10 pain to assist with unaided ambulation to promote funcitonal mobility (04/21/2016)   Baseline vacuming 10 minutes, grocerry strore 15 minutes up to 3/10 pain   Time 3   Period Weeks   Status Achieved           PT Long Term Goals - 05/14/16 1251      PT LONG TERM GOAL #1   Title she will be I with all HEP given as of last visit (05/12/2016)   Time 6   Period Weeks   Status On-going     PT LONG TERM GOAL #2   Title pt will improve her R knee flexion to >/= 120 degrees and extension to </= -4 degrees  with </= 2/10 pain for functional and efficient gait pattern (05/12/2016)   Baseline 8-112   Time 6   Period Weeks   Status Partially Met     PT LONG TERM GOAL #3   Title she will increase her RLE strength to >/= 4+/5 with </= 2/10 pain for prolonged walking/ standing activities (05/12/2016)   Baseline 4/5 right knee   Period Weeks   Status Partially Met     PT LONG TERM GOAL #4   Title pt will be able to sit/ stand and walk >/= 45 min with no AD with </= 2/10 pain for  functional endurance required or work and pt's personal goal of personal exercise (05/12/2016)   Baseline standing/walking 1.5 hours with 3/10 pain   Time 6   Period Weeks   Status Partially Met     PT LONG TERM GOAL #5   Title she will increase her FOTO score to </= 46% limited to demontrate improvement at discharge (05/12/2016)   Baseline 40% limited on 12/7    Time 6   Period Weeks   Status On-going               Plan - 06/04/16 1306    Clinical Impression Statement Pt reports soreness after last visit likely due to beginning knee machines. We repeated  knee machines and added knee flexion on cybex with pt c/o lateral right posterior knee pain. Upon palpation pt is tender in lateral hamstring, tissue relaxed with gentle soft tissue work. Pt requested knee support tape reapplied as this helps with her pain.    PT Next Visit Plan continue [place] PRN ;  knee mobs extension with external rotation , AROM/ PROM, bike vs Nu-step Vaso for swelling/ pain,  step-ups/ down, resisted gait walking, quad strengthening and CKC activities   PT Home Exercise Plan heel slides with strap, clam shell, heel raises, quad set, gait with exaggerated heel strike and toe off, prone hangs    Consulted and Agree with Plan of Care Patient      Patient will benefit from skilled therapeutic intervention in order to improve the following deficits and impairments:  Abnormal gait, Pain, Improper body mechanics, Increased edema, Decreased strength, Decreased range of motion, Increased fascial restricitons, Hypomobility, Decreased endurance, Decreased activity tolerance, Difficulty walking  Visit Diagnosis: Chronic pain of right knee  Localized edema  Stiffness of right knee, not elsewhere classified  Muscle weakness (generalized)     Problem List Patient Active Problem List   Diagnosis Date Noted  . S/P total knee arthroplasty 03/12/2016  . Obesity (BMI 35.0-39.9 without comorbidity) 06/13/2015  . Elevated BP 06/06/2015  . Elevated LDL cholesterol level 08/17/2014  . Breast cancer of upper-inner quadrant of right female breast ([place]) 02/28/2014  . DJD (degenerative joint disease) of knee 06/29/2013  . Paroxysmal supraventricular tachycardia ([place]) 05/09/2013  . Fatigue 05/09/2013  . Screening for hypercholesterolemia 05/09/2013  . OSA (obstructive sleep apnea)   . Obesity (BMI 30-39.9)     Dorene Ar , [place] 06/04/2016, 1:09 PM  [facility] [address] [place], [place], [address] Phone: [phone]    Fax:  [phone]  Name: SIYA FLURRY MRN: [id number] Date of Birth: Sep 24, 1955

## 2016-06-09 ENCOUNTER — Ambulatory Visit: Payer: 59 | Admitting: Physical Therapy

## 2016-06-11 ENCOUNTER — Telehealth: Payer: Self-pay | Admitting: Oncology

## 2016-06-11 ENCOUNTER — Ambulatory Visit: Payer: 59 | Admitting: Physical Therapy

## 2016-06-11 ENCOUNTER — Ambulatory Visit: Payer: 59 | Admitting: Oncology

## 2016-06-11 NOTE — Telephone Encounter (Signed)
Pt called 1/17 to r/s appts due to weather. Gave pt new appt date/time

## 2016-06-12 MED FILL — HYDROCODON-APAP 5-325: 5-325 | 7 days supply | Qty: 42 | Fill #0

## 2016-06-16 ENCOUNTER — Ambulatory Visit: Payer: 59 | Admitting: Physical Therapy

## 2016-06-16 DIAGNOSIS — M6281 Muscle weakness (generalized): Secondary | ICD-10-CM | POA: Diagnosis not present

## 2016-06-16 DIAGNOSIS — R2689 Other abnormalities of gait and mobility: Secondary | ICD-10-CM | POA: Diagnosis not present

## 2016-06-16 DIAGNOSIS — R600 Localized edema: Secondary | ICD-10-CM

## 2016-06-16 DIAGNOSIS — G8929 Other chronic pain: Secondary | ICD-10-CM | POA: Diagnosis not present

## 2016-06-16 DIAGNOSIS — M25561 Pain in right knee: Principal | ICD-10-CM

## 2016-06-16 DIAGNOSIS — M25661 Stiffness of right knee, not elsewhere classified: Secondary | ICD-10-CM

## 2016-06-16 NOTE — Therapy (Signed)
[place], [place], [address] Phone: [phone]   Fax:  [phone]  Physical Therapy Treatment  Patient Details  Name: Pamela Underwood MRN: [id number] Date of Birth: 03-Mar-1956 Referring Provider: Kathryne Hitch MD  Encounter Date: 06/16/2016      PT End of Session - 06/16/16 1151    Visit Number 18   Number of Visits 20   Date for PT Re-Evaluation 06/18/16   Authorization Type [facility] U[facility]   PT Start Time 1148   PT Stop Time 1238   PT Time Calculation (min) 50 min      Past Medical History:  Diagnosis Date  . Arthritis   . Atrial tachycardia ([place])    takes rythmol daily and cardiazem daily  . Back pain    occasionally  . Breast cancer ([place]) 02/22/14   right upper inner  . Chronic anxiety    takes Xanax daily as needed when heart rate going up  . Colitis    hx-one  . Dysrhythmia    atrial tachycardia- takes meds and controls it  . Elevated BP 06/06/2015  . Insomnia    takes Ambien nightly as needed  . Joint pain   . Joint swelling   . S/P radiation therapy 03/28/2014 through 04/27/2014                                03/28/2014 through 04/27/2014                                                                         Right breast 4250 cGy 17 sessions, right breast boost 1000 cGy in 4 sessions (hypofractionated)                         . Sleep apnea    uses a CPAP    Past Surgical History:  Procedure Laterality Date  . BREAST BIOPSY Left 05/02/1999   benign  . BREAST LUMPECTOMY WITH AXILLARY LYMPH NODE BIOPSY Right 02/22/14   upper inner  . COLONOSCOPY    . CYSTOSCOPY    . KNEE ARTHROSCOPY WITH MEDIAL MENISECTOMY Left 03/31/2013   Procedure: LEFT KNEE ARTHROSCOPY WITH PARTIAL MEDIAL MENISECTOMY, CHONDROPLASTY OF PATELLA  FEMORAL JOINT, EXCISION OF MEDIAL PLICA, PARTIAL LATERAL MENISECTOMY;  Surgeon: [name], MD;  Location: [facility];  Service: Orthopedics;  Laterality:  Left;  . TOTAL KNEE ARTHROPLASTY Left 06/29/2013   Procedure: TOTAL KNEE ARTHROPLASTY;  Surgeon: [name], MD;  Location: [place];  Service: Orthopedics;  Laterality: Left;  . TOTAL KNEE ARTHROPLASTY Right 03/12/2016  . TOTAL KNEE ARTHROPLASTY Right 03/12/2016   Procedure: RIGHT TOTAL KNEE ARTHROPLASTY;  Surgeon: [name], MD;  Location: [facility];  Service: Orthopedics;  Laterality: Right;  . TUBAL LIGATION      There were no vitals filed for this visit.      Subjective Assessment - 06/16/16 1152    Subjective I almost did not come, I am going back to work on Friday.    Pertinent History hx or L TKA    Currently in Pain? Yes  Pain Score 1    Pain Location Knee   Pain Orientation Right;Medial   Pain Descriptors / Indicators --  stifness   Aggravating Factors  prolonged standing, sleep positions    Pain Relieving Factors rest ice, stretch            [facility] PT Assessment - 06/16/16 0001      Observation/Other Assessments   Focus on Therapeutic Outcomes (FOTO)  38 % limitation, predicted 46%  limited.      AROM   Right Knee Extension -6   Right Knee Flexion 118     PROM   Right Knee Extension -4   Right Knee Flexion 120     Strength   Right Hip Flexion 4/5   Right Hip ABduction 4+/5   Left Hip Flexion 4/5   Left Hip ABduction 4+/5   Right Knee Flexion 4/5   Right Knee Extension 4+/5                     [facility] Adult PT Treatment/Exercise - 06/16/16 0001      Knee/Hip Exercises: Stretches   Active Hamstring Stretch 30 seconds;2 reps  strap   Quad Stretch 3 reps;30 seconds  in prone   Other Knee/Hip Stretches slant board stretch x 1 minutes     Knee/Hip Exercises: Aerobic   Recumbent Bike L2 x 6 minutes     Knee/Hip Exercises: Standing   Heel Raises Limitations 20 single each    Wall Squat 20 reps   SLS with Vectors with Tai chi taps forward x 20 , taps so side x 10, weight on stance leg      Cryotherapy   Number Minutes Cryotherapy 10  Minutes   Cryotherapy Location Knee   Type of Cryotherapy Ice pack                PT Education - 06/16/16 1242    Education provided Yes   Education Details Closed chain HEP   Person(s) Educated Patient   Methods Explanation;Handout   Comprehension Verbalized understanding          PT Short Term Goals - 06/16/16 1214      PT SHORT TERM GOAL #1   Title Independent with initial HEP   Status Achieved     PT SHORT TERM GOAL #2   Title pt will improve R knee flexion to >/= 105 degrees and extension to </= -6 degrees with </= 4/10 pain for functional progression (04/21/2016)   Baseline 6-118   Status Achieved     PT SHORT TERM GOAL #3   Title pt will be able to walk/ stand for >/= 15 min with no AD with </= 4/10 pain to assist with unaided ambulation to promote funcitonal mobility (04/21/2016)   Status Achieved           PT Long Term Goals - 06/16/16 1215      PT LONG TERM GOAL #1   Title she will be I with all HEP given as of last visit (05/12/2016)   Status On-going     PT LONG TERM GOAL #2   Title pt will improve her R knee flexion to >/= 120 degrees and extension to </= -4 degrees  with </= 2/10 pain for functional and efficient gait pattern (05/12/2016)   Baseline 3-120 passive    Time 6   Period Weeks   Status Partially Met     PT LONG TERM GOAL #3   Title she will  increase her RLE strength to >/= 4+/5 with </= 2/10 pain for prolonged walking/ standing activities (05/12/2016)   Baseline knee flexion and hip flexion 4/5    Time 6   Period Weeks   Status Partially Met     PT LONG TERM GOAL #4   Title pt will be able to sit/ stand and walk >/= 45 min with no AD with </= 2/10 pain for functional endurance required or work and pt's personal goal of personal exercise (05/12/2016)   Baseline no difficulty with 45 minutes, stood 3-4 hours at daughter's home while helping her move, pain up to 3/10   Status Achieved     PT LONG TERM GOAL #5   Title she  will increase her FOTO score to </= 46% limited to demontrate improvement at discharge (05/12/2016)   Baseline 38 % limited n 06/16/2016   Status Achieved               Plan - 06/16/16 1252    Clinical Impression Statement One more visit before patient returns to work. She admits to mostly just stretching for HEP. Encouraged continued strengthening for retrun to work and we finalized a closed chain HEP for LE strengthening. Her AROM  has improved since last visit. She has achieved or partially met all goals.    PT Next Visit Plan review and discharge next visit. ? instruct in self application of KT tape for knee support.    PT Home Exercise Plan heel slides with strap, clam shell, heel raises, quad set, gait with exaggerated heel strike and toe off, prone hangs, prone quad stretch, wall slides, Tai chi step forward, step to side. step ups   Consulted and Agree with Plan of Care Patient      Patient will benefit from skilled therapeutic intervention in order to improve the following deficits and impairments:  Abnormal gait, Pain, Improper body mechanics, Increased edema, Decreased strength, Decreased range of motion, Increased fascial restricitons, Hypomobility, Decreased endurance, Decreased activity tolerance, Difficulty walking  Visit Diagnosis: Chronic pain of right knee  Localized edema  Stiffness of right knee, not elsewhere classified  Muscle weakness (generalized)  Other abnormalities of gait and mobility     Problem List Patient Active Problem List   Diagnosis Date Noted  . S/P total knee arthroplasty 03/12/2016  . Obesity (BMI 35.0-39.9 without comorbidity) 06/13/2015  . Elevated BP 06/06/2015  . Elevated LDL cholesterol level 08/17/2014  . Breast cancer of upper-inner quadrant of right female breast ([place]) 02/28/2014  . DJD (degenerative joint disease) of knee 06/29/2013  . Paroxysmal supraventricular tachycardia ([place]) 05/09/2013  . Fatigue 05/09/2013  .  Screening for hypercholesterolemia 05/09/2013  . OSA (obstructive sleep apnea)   . Obesity (BMI 30-39.9)     Dorene Ar, [place] 06/16/2016, 12:59 PM  [facility] [address] [place], [place], [address] Phone: [phone]   Fax:  [phone]  Name: HEYLI MIN MRN: [id number] Date of Birth: 1955-07-18

## 2016-06-16 NOTE — Patient Instructions (Signed)
Step Down: Anterior    CAN PERFORM WITHOUT STEP: reach left leg forward while bending right knee. Keep weight on right leg. OR: From 4-6" step, reach one leg forward as far as possible, still able to return easily. Touch toe and heel to floor. Return to single leg balance. Repeat with other leg. Repeat _10___ times. Do _2__ sessions per day.  Copyright  VHI. All rights reserved.  Step-Up: Lateral   CAN PERFORM WITHOUT STEP: reach left leg out to side while bending right knee. Keep weight on right leg. OR: Step up to side with right leg. Bring other foot up onto ____ inch step. Return to floor position with left leg. Repeat ____ times per session. Do ____ sessions per day. Repeat in dimly lit room. Repeat with eyes closed.  STEP UPS  Forward   Facing step, place right leg on step, flexed at hip. Step up slowly, bringing hips in line with knee and shoulder. Bring other foot onto step. Reverse process to step back down.  Do __20__ repetitions, ___1-2_ sets., 2 sessions per day.  Strengthening: Wall Slide    Leaning on wall, slowly lower buttocks until thighs are parallel to floor. Hold _5___ seconds. Tighten thigh muscles and return. Repeat __20__ times per set. Do __1__ sets per session. Do __2__ sessions per day.

## 2016-06-18 ENCOUNTER — Ambulatory Visit: Payer: 59 | Admitting: Physical Therapy

## 2016-06-18 DIAGNOSIS — M25561 Pain in right knee: Principal | ICD-10-CM

## 2016-06-18 DIAGNOSIS — R600 Localized edema: Secondary | ICD-10-CM

## 2016-06-18 DIAGNOSIS — M25661 Stiffness of right knee, not elsewhere classified: Secondary | ICD-10-CM

## 2016-06-18 DIAGNOSIS — M6281 Muscle weakness (generalized): Secondary | ICD-10-CM | POA: Diagnosis not present

## 2016-06-18 DIAGNOSIS — R2689 Other abnormalities of gait and mobility: Secondary | ICD-10-CM | POA: Diagnosis not present

## 2016-06-18 DIAGNOSIS — G8929 Other chronic pain: Secondary | ICD-10-CM | POA: Diagnosis not present

## 2016-06-18 MED FILL — AMOXICILLIN 500 MG CAPSULE: 500 | 1 days supply | Qty: 4 | Fill #0

## 2016-06-18 NOTE — Therapy (Signed)
[place], [place], [address] Phone: [phone]   Fax:  [phone]  Physical Therapy Treatment / Discharge Note  Patient Details  Name: Pamela Underwood MRN: [id number] Date of Birth: 02/20/1956 Referring Provider: Kathryne Hitch MD  Encounter Date: 06/18/2016      PT End of Session - 06/18/16 4097    Visit Number 19   Number of Visits 20   Date for PT Re-Evaluation 06/18/16   PT Start Time 1152   PT Stop Time 1229   PT Time Calculation (min) 37 min   Activity Tolerance Patient tolerated treatment well   Behavior During Therapy [facility] for tasks assessed/performed      Past Medical History:  Diagnosis Date  . Arthritis   . Atrial tachycardia ([place])    takes rythmol daily and cardiazem daily  . Back pain    occasionally  . Breast cancer ([place]) 02/22/14   right upper inner  . Chronic anxiety    takes Xanax daily as needed when heart rate going up  . Colitis    hx-one  . Dysrhythmia    atrial tachycardia- takes meds and controls it  . Elevated BP 06/06/2015  . Insomnia    takes Ambien nightly as needed  . Joint pain   . Joint swelling   . S/P radiation therapy 03/28/2014 through 04/27/2014                                03/28/2014 through 04/27/2014                                                                         Right breast 4250 cGy 17 sessions, right breast boost 1000 cGy in 4 sessions (hypofractionated)                         . Sleep apnea    uses a CPAP    Past Surgical History:  Procedure Laterality Date  . BREAST BIOPSY Left 05/02/1999   benign  . BREAST LUMPECTOMY WITH AXILLARY LYMPH NODE BIOPSY Right 02/22/14   upper inner  . COLONOSCOPY    . CYSTOSCOPY    . KNEE ARTHROSCOPY WITH MEDIAL MENISECTOMY Left 03/31/2013   Procedure: LEFT KNEE ARTHROSCOPY WITH PARTIAL MEDIAL MENISECTOMY, CHONDROPLASTY OF PATELLA  FEMORAL JOINT, EXCISION OF MEDIAL PLICA, PARTIAL LATERAL MENISECTOMY;   Surgeon: [name], MD;  Location: [place];  Service: Orthopedics;  Laterality: Left;  . TOTAL KNEE ARTHROPLASTY Left 06/29/2013   Procedure: TOTAL KNEE ARTHROPLASTY;  Surgeon: [name], MD;  Location: [place];  Service: Orthopedics;  Laterality: Left;  . TOTAL KNEE ARTHROPLASTY Right 03/12/2016  . TOTAL KNEE ARTHROPLASTY Right 03/12/2016   Procedure: RIGHT TOTAL KNEE ARTHROPLASTY;  Surgeon: [name], MD;  Location: [place];  Service: Orthopedics;  Laterality: Right;  . TUBAL LIGATION      There were no vitals filed for this visit.      Subjective Assessment - 06/18/16 1152    Subjective "Doing fine today. Ready to get out of the house."   Currently in Pain? Yes  Pain Score 1    Pain Location Knee   Pain Orientation Right;Medial   Pain Type Surgical pain   Pain Onset More than a month ago   Pain Frequency Intermittent   Aggravating Factors  prolonged standing   Pain Relieving Factors rest, ice            [facility] PT Assessment - 06/18/16 0001      AROM   Right Knee Extension -3   Right Knee Flexion 116     Strength   Right Hip Flexion 4+/5   Right Hip ABduction 5/5   Right Knee Flexion 5/5   Right Knee Extension 5/5                     [facility] Adult PT Treatment/Exercise - 06/18/16 0001      Knee/Hip Exercises: Stretches   Active Hamstring Stretch 2 reps;30 seconds  with strap   Quad Stretch 3 reps;30 seconds  with strap     Knee/Hip Exercises: Aerobic   Recumbent Bike L2 x 7 minutes     Manual Therapy   Manual Therapy Taping   Kinesiotex --  knee support tape                PT Education - 06/18/16 1240    Education provided Yes   Education Details Continuing HEP at home. Instructed on applying KT knee support tape at home.   Person(s) Educated Patient   Methods Explanation;Demonstration   Comprehension Verbalized understanding          PT Short Term Goals - 06/18/16 1212      PT SHORT TERM GOAL  #1   Title Independent with initial HEP   Time 3   Period Weeks   Status Achieved     PT SHORT TERM GOAL #2   Title pt will improve R knee flexion to >/= 105 degrees and extension to </= -6 degrees with </= 4/10 pain for functional progression (04/21/2016)   Time 3   Period Weeks   Status Achieved     PT SHORT TERM GOAL #3   Title pt will be able to walk/ stand for >/= 15 min with no AD with </= 4/10 pain to assist with unaided ambulation to promote funcitonal mobility (04/21/2016)   Time 3   Period Weeks   Status Achieved           PT Long Term Goals - 06/18/16 1233      PT LONG TERM GOAL #1   Title she will be I with all HEP given as of last visit (05/12/2016)   Time 6   Period Weeks   Status Partially Met     PT LONG TERM GOAL #2   Title pt will improve her R knee flexion to >/= 120 degrees and extension to </= -4 degrees  with </= 2/10 pain for functional and efficient gait pattern (05/12/2016)   Baseline 3-116 active   Time 6   Period Weeks   Status Partially Met     PT LONG TERM GOAL #3   Title she will increase her RLE strength to >/= 4+/5 with </= 2/10 pain for prolonged walking/ standing activities (05/12/2016)   Time 6   Period Weeks   Status Achieved     PT LONG TERM GOAL #4   Title pt will be able to sit/ stand and walk >/= 45 min with no AD with </= 2/10 pain for functional endurance required or work  and pt's personal goal of personal exercise (05/12/2016)   Time 6   Period Weeks   Status Achieved     PT LONG TERM GOAL #5   Title she will increase her FOTO score to </= 46% limited to demontrate improvement at discharge (05/12/2016)   Time 6   Period Weeks   Status Achieved               Plan - 06/18/16 1244    Clinical Impression Statement Pt plans to return to work on Friday. She reports pain getting up to a 1/10 mostly with prolonged standing. She reports doing more of her HEP in the last few days. Her extension had improved since last  session, but she reported feeling tightness in her quads with knee flexion. Reported that it felt easier to stand on RLE with knee supprt taping, and that she plans to use it at home.    Rehab Potential Good   PT Treatment/Interventions ADLs/Self Care Home Management;Dry needling;Cryotherapy;Electrical Stimulation;Iontophoresis 11m/ml Dexamethasone;Moist Heat;Traction;Ultrasound;Therapeutic activities;Therapeutic exercise;Neuromuscular re-education;Patient/family education;Taping;Vasopneumatic Device;Passive range of motion;Gait training;Stair training;Manual techniques   PT Home Exercise Plan heel slides with strap, clam shell, heel raises, quad set, gait with exaggerated heel strike and toe off, prone hangs, prone quad stretch, wall slides, Tai chi step forward, step to side. step ups   Consulted and Agree with Plan of Care Patient      Patient will benefit from skilled therapeutic intervention in order to improve the following deficits and impairments:  Abnormal gait, Pain, Improper body mechanics, Increased edema, Decreased strength, Decreased range of motion, Increased fascial restricitons, Hypomobility, Decreased endurance, Decreased activity tolerance, Difficulty walking  Visit Diagnosis: Chronic pain of right knee  Localized edema  Stiffness of right knee, not elsewhere classified  Muscle weakness (generalized)  Other abnormalities of gait and mobility     Problem List Patient Active Problem List   Diagnosis Date Noted  . S/P total knee arthroplasty 03/12/2016  . Obesity (BMI 35.0-39.9 without comorbidity) 06/13/2015  . Elevated BP 06/06/2015  . Elevated LDL cholesterol level 08/17/2014  . Breast cancer of upper-inner quadrant of right [place] breast [place] 02/28/2014  . DJD (degenerative joint disease) of knee 06/29/2013  . Paroxysmal [place]ventricular tachycardia ([place] 05/09/2013  . Fatigue 05/09/2013  . Screening for hypercholesterolemia 05/09/2013  . OSA (obstructive  sleep apnea)   . Obesity (BMI 30-39.9)     HAlda Ponder SPT 06/18/2016, 12:54 PM  [facility] [place] [address]Phone: [phone]  Fax:  [phone] Name: LJAYNI PRESCHERMRN: [id number] of Birth: 9May 06, 1957      PHYSICAL THERAPY DISCHARGE SUMMARY  Visits from Start of Care: 19  Current functional level related to goals / functional outcomes: FOTO 38% limited   Remaining deficits: R knee stiffness with flexion/ extension with mild AROM limitation in both directions. Intermittent soreness with prologed weight bearing activities up to a 1/10.    Education / Equipment: HEP, theraband, posture and lifting biomechanics, anatomy of condition, KT taping  Plan: Patient agrees to discharge.  Patient goals were partially met. Patient is being discharged due to being pleased with the current functional level.  ?????     Kristoffer Leamon PT, DPT, LAT, ATC  06/18/16  1:01 PM

## 2016-06-24 MED FILL — HYDROCODON-APAP 5-325: 5-325 | 7 days supply | Qty: 42 | Fill #0

## 2016-06-26 ENCOUNTER — Ambulatory Visit (HOSPITAL_BASED_OUTPATIENT_CLINIC_OR_DEPARTMENT_OTHER): Payer: 59 | Admitting: Oncology

## 2016-06-26 VITALS — BP 142/77 | HR 113 | Temp 98.1°F | Resp 18 | Ht 67.0 in | Wt 188.6 lb

## 2016-06-26 DIAGNOSIS — C50211 Malignant neoplasm of upper-inner quadrant of right female breast: Secondary | ICD-10-CM

## 2016-06-26 DIAGNOSIS — Z170 Estrogen receptor positive status [ER+]: Secondary | ICD-10-CM

## 2016-06-26 DIAGNOSIS — Z79810 Long term (current) use of selective estrogen receptor modulators (SERMs): Secondary | ICD-10-CM

## 2016-06-26 DIAGNOSIS — C50411 Malignant neoplasm of upper-outer quadrant of right female breast: Secondary | ICD-10-CM

## 2016-06-26 MED ORDER — TAMOXIFEN CITRATE 20 MG PO TABS: 20.0000 mg | ORAL_TABLET | Freq: Every day | ORAL | 3 refills | Status: DC

## 2016-06-26 MED FILL — TAMOXIFEN 20 MG TABLET: 20 | 90 days supply | Qty: 90 | Fill #0

## 2016-06-26 NOTE — Progress Notes (Signed)
[place]  [phone] Fax:([phone]     ID: Pamela Underwood DOB: June 08, 1955  MR#: [id number]  KCL#:[id number]  Patient Care Team: Everlene Farrier, MD as PCP - General (Obstetrics and Gynecology) Jettie Booze, MD as Consulting Physician (Cardiology) Everlene Farrier, MD as Consulting Physician (Obstetrics and Gynecology) Chauncey Cruel, MD as Consulting Physician (Oncology) Serena Colonel, RN as [place] Management OTHER MD: Rolm Bookbinder M.D., Arloa Koh M.D.  CHIEF COMPLAINT: Estrogen receptor positive breast cancer  CURRENT TREATMENT: tamoxifen   BREAST CANCER HISTORY: From the original intake note:  Pamela Underwood had routine screening mammography at [facility] 02/03/2014. This showed a focal asymmetry in the right breast and additional views on 02/06/2014 showed an irregular mass in the right breast, 11:00 position. Ultrasound the same day showed this to be 1.1 cm, with irregular margins, and hypoechoic. There was also an oval lymph nodes with focal cortical thickening in the right axilla. There was also, finally, a 1.1 cm oval mass with circumscribed margins in the right breast upper inner quadrant. Color flow imaging there showed no increase in vascularity.biopsy of the upper outer quadrant right breast mass showed invasive ductal carcinoma. The upper inner quadrant right breast lesion was a fibroadenoma versus a benign phyllodes tumor. The right axillary lymph node that was suspicious was negative for metastatic disease (results are taken from MRI dictation, as the pathology report is not in EPIC).  On 02/08/2014 the patient underwent bilateral breast MRIs. This showed, in the upper outer right breast an irregular enhancing mass measuring 1.4 cm. There were mildly prominent right axillary lymph nodes and bilateral nonspecific internal mammary lymph nodes measuring up to 4 mm.  On 02/22/2014 the patient underwent right  lumpectomy for the upper inner quadrant lesion which proved to be a fibroadenoma. On the same day right upper outer quadrant lumpectomy showed an invasive ductal carcinoma measuring 1.6 cm, in the setting of ductal carcinoma in situ. The invasive tumor was less than a millimeter from the anterior margin. Both sentinel lymph nodes were benign.  The patient's subsequent history is as detailed below  INTERVAL HISTORY: Pamela Underwood returns today for follow-up of her estrogen receptor positive breast cancer. She continues on tamoxifen, with excellent tolerance. Hot flashes and vaginal discharge are not major issues. She has never had a clotting problem related to this.  Since her last visit here she had a right total knee replacement under then [facility]. She did very well with the surgery and just completed rehabilitation. She is back to work full-time in the [place].  REVIEW OF SYSTEMS: Zakiah'mother-in-law, who is 87 years old, has moved in with them and this is causing multiple stresses as might be expected. The basic problem is that she has absolutely no free time for herself. From a symptom point of view, aside from issues related to the recent surgery and some fatigue, a detailed review of systems today was benign   PAST MEDICAL HISTORY: Past Medical History:  Diagnosis Date  . Arthritis   . Atrial tachycardia ([place])    takes rythmol daily and cardiazem daily  . Back pain    occasionally  . Breast cancer ([place]) 02/22/14   right upper inner  . Chronic anxiety    takes Xanax daily as needed when heart rate going up  . Colitis    hx-one  . Dysrhythmia    atrial tachycardia- takes meds and controls it  . Elevated BP 06/06/2015  . Insomnia  takes Ambien nightly as needed  . Joint pain   . Joint swelling   . S/P radiation therapy 03/28/2014 through 04/27/2014                                03/28/2014 through 04/27/2014                                                                         Right breast  4250 cGy 17 sessions, right breast boost 1000 cGy in 4 sessions (hypofractionated)                         . Sleep apnea    uses a CPAP    PAST SURGICAL HISTORY: Past Surgical History:  Procedure Laterality Date  . BREAST BIOPSY Left 05/02/1999   benign  . BREAST LUMPECTOMY WITH AXILLARY LYMPH NODE BIOPSY Right 02/22/14   upper inner  . COLONOSCOPY    . CYSTOSCOPY    . KNEE ARTHROSCOPY WITH MEDIAL MENISECTOMY Left 03/31/2013   Procedure: LEFT KNEE ARTHROSCOPY WITH PARTIAL MEDIAL MENISECTOMY, CHONDROPLASTY OF PATELLA  FEMORAL JOINT, EXCISION OF MEDIAL PLICA, PARTIAL LATERAL MENISECTOMY;  Surgeon: [name], MD;  Location: [place];  Service: Orthopedics;  Laterality: Left;  . TOTAL KNEE ARTHROPLASTY Left 06/29/2013   Procedure: TOTAL KNEE ARTHROPLASTY;  Surgeon: [name], MD;  Location: [place];  Service: Orthopedics;  Laterality: Left;  . TOTAL KNEE ARTHROPLASTY Right 03/12/2016  . TOTAL KNEE ARTHROPLASTY Right 03/12/2016   Procedure: RIGHT TOTAL KNEE ARTHROPLASTY;  Surgeon: [name], MD;  Location: [place];  Service: Orthopedics;  Laterality: Right;  . TUBAL LIGATION      FAMILY HISTORY Family History  Problem Relation Age of Onset  . Heart disease Father   . Hypertension Father   . Diabetes Father   . Heart attack Neg Hx    the patient's father died from a myocardial infarction in his 41s. The patient's mother also died in her 47s, after her an accidental fire exposure. The patient had one sister, no brothers. There is no history of breast or ovarian cancer in the family  GYNECOLOGIC HISTORY:  No LMP recorded. Patient is postmenopausal. Menarche age 66, first live birth age 17, the patient is GX P3. She stopped having periods at the age of 58. She did not take hormone replacement.  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 Her husband Ronalee Belts is vice Radio producer for a [employer]. Daughter Vida Roller works as a Marine scientist in  [place]. Daughter Florentina Jenny teaches kindergarten in [place]. Son Selinda Flavin is Facilities manager in [place]. The patient has no grandchildren. She attends a [employer]    ADVANCED DIRECTIVES: Not in place   HEALTH MAINTENANCE: Social History  Substance Use Topics  . Smoking status: Never Smoker  . Smokeless tobacco: Never Used  . Alcohol use Yes     Comment: socailly     Colonoscopy:  PAP:  Bone density:[facility] 05/15/2014 -- normal  Lipid panel:  Allergies  Allergen Reactions  . Escitalopram Oxalate Other (See Comments)    Hives, welts, all  over skin rash    Current Outpatient Prescriptions  Medication Sig Dispense Refill  . ALPRAZolam (XANAX) 0.25 MG tablet Take 0.25 mg by mouth daily as needed for anxiety.   4  . aspirin EC 325 MG EC tablet Take 1 tablet (325 mg total) by mouth daily with breakfast. 30 tablet 0  . diltiazem (CARDIZEM CD) 240 MG 24 hr capsule Take 1 capsule (240 mg total) by mouth daily. 90 capsule 3  . methocarbamol (ROBAXIN) 500 MG tablet Take 1 tablet (500 mg total) by mouth every 6 (six) hours as needed for muscle spasms. 30 tablet 0  . propafenone (RYTHMOL SR) 225 MG 12 hr capsule TAKE 1 CAPSULE BY MOUTH EVERY 12 HOURS (Patient taking differently: Take 225 mg by mouth 2 (two) times daily. ) 180 capsule 1  . rosuvastatin (CRESTOR) 5 MG tablet Take 1 tablet (5 mg total) by mouth daily. 90 tablet 2  . tamoxifen (NOLVADEX) 20 MG tablet Take 1 tablet (20 mg total) by mouth daily. 90 tablet 3  . VITAMIN D, CHOLECALCIFEROL, PO Take 1,000 Units by mouth daily.     Marland Kitchen zolpidem (AMBIEN) 10 MG tablet Take 10 mg by mouth at bedtime as needed for sleep.      No current facility-administered medications for this visit.     OBJECTIVE: Middle-aged white woman In no acute distress Vitals:   06/26/16 1513  BP: (!) 142/77  Pulse: (!) 113  Resp: 18  Temp: 98.1 F (36.7 C)     Body mass index is 29.54 kg/m.    ECOG FS:1 - Symptomatic but completely  ambulatory  Sclerae unicteric, pupils round and equal Oropharynx clear and moist-- no thrush or other lesions No cervical or supraclavicular adenopathy Lungs no rales or rhonchi Heart regular rate and rhythm Abd soft, nontender, positive bowel sounds MSK no focal spinal tenderness, no upper extremity lymphedema Neuro: nonfocal, well oriented, appropriate affect Breasts: The right breast is status post lumpectomy and radiation. There is no evidence of local recurrence. The right axilla is benign. Left breast is unremarkable   LAB RESULTS:  CMP     Component Value Date/Time   NA 137 03/14/2016 0613   NA 140 06/06/2015 1556   K 3.9 03/14/2016 0613   K 4.4 06/06/2015 1556   CL 106 03/14/2016 0613   CO2 26 03/14/2016 0613   CO2 24 06/06/2015 1556   GLUCOSE 121 (H) 03/14/2016 0613   GLUCOSE 105 06/06/2015 1556   BUN 14 03/14/2016 0613   BUN 12.7 06/06/2015 1556   CREATININE 0.64 03/14/2016 0613   CREATININE 0.9 06/06/2015 1556   CALCIUM 8.2 (L) 03/14/2016 0613   CALCIUM 8.9 06/06/2015 1556   PROT 6.7 02/29/2016 1030   PROT 6.9 06/06/2015 1556   ALBUMIN 4.2 02/29/2016 1030   ALBUMIN 4.0 06/06/2015 1556   AST 23 02/29/2016 1030   AST 20 06/06/2015 1556   ALT 20 02/29/2016 1030   ALT 21 06/06/2015 1556   ALKPHOS 53 02/29/2016 1030   ALKPHOS 64 06/06/2015 1556   BILITOT 0.2 (L) 02/29/2016 1030   BILITOT 0.47 06/06/2015 1556   GFRNONAA >60 03/14/2016 0613   GFRAA >60 03/14/2016 0613    I No results found for: SPEP  Lab Results  Component Value Date   WBC 12.7 (H) 03/14/2016   NEUTROABS 3.5 02/29/2016   HGB 10.6 (L) 03/14/2016   HCT 31.8 (L) 03/14/2016   MCV 95.2 03/14/2016   PLT 146 (L) 03/14/2016  Chemistry      Component Value Date/Time   NA 137 03/14/2016 0613   NA 140 06/06/2015 1556   K 3.9 03/14/2016 0613   K 4.4 06/06/2015 1556   CL 106 03/14/2016 0613   CO2 26 03/14/2016 0613   CO2 24 06/06/2015 1556   BUN 14 03/14/2016 0613   BUN 12.7  06/06/2015 1556   CREATININE 0.64 03/14/2016 0613   CREATININE 0.9 06/06/2015 1556      Component Value Date/Time   CALCIUM 8.2 (L) 03/14/2016 0613   CALCIUM 8.9 06/06/2015 1556   ALKPHOS 53 02/29/2016 1030   ALKPHOS 64 06/06/2015 1556   AST 23 02/29/2016 1030   AST 20 06/06/2015 1556   ALT 20 02/29/2016 1030   ALT 21 06/06/2015 1556   BILITOT 0.2 (L) 02/29/2016 1030   BILITOT 0.47 06/06/2015 1556       No results found for: LABCA2  No components found for: LABCA125  No results for input(s): INR in the last 168 hours.  Urinalysis    Component Value Date/Time   COLORURINE YELLOW 02/29/2016 1035   APPEARANCEUR CLEAR 02/29/2016 1035   LABSPEC 1.003 (L) 02/29/2016 1035   PHURINE 6.0 02/29/2016 1035   GLUCOSEU NEGATIVE 02/29/2016 1035   HGBUR SMALL (A) 02/29/2016 1035   BILIRUBINUR NEGATIVE 02/29/2016 1035   KETONESUR NEGATIVE 02/29/2016 1035   PROTEINUR NEGATIVE 02/29/2016 1035   UROBILINOGEN 0.2 06/27/2013 1401   NITRITE NEGATIVE 02/29/2016 1035   LEUKOCYTESUR NEGATIVE 02/29/2016 1035    STUDIES: Mammography scheduled for next week  ASSESSMENT: 61 y.o. [place] woman status post right lumpectomy and sentinel lymph node sampling 02/22/2014 for a pT1c pN0, stage IA invasive ductal carcinoma, grade 1 estrogen receptor 100% positive, progesterone receptor 85% positive, with no HER-2 amplification and an MIB-1 of 12%  (1) OncotypeDX 02/28/2014 score of 8 predicts an outside-the-breast recurrence of 6% if the patient's only systemic treatment is tamoxifen for 5 years. It also predicts no benefit from chemotherapy  (2) completed adjuvant radiation 04/27/2014: 4250 cGy to the right breast with a 1000 cGy boost to the scar  (3) started tamoxifen January 2015    PLAN: Mikhia is now 2-1/2 years out from definitive surgery for her breast cancer with no evidence of disease recurrence. This is very favorable.  She is tolerating tamoxifen remarkably well and the plan will be  to continue that for a minimum of 5 years.  We discussed some aspects of her social situation and that discussion is not documented here.  She knows to call for any problems that may develop before her next visit.  Chauncey Cruel, MD   06/26/2016 5:12 PM

## 2016-07-01 MED FILL — ZOLPIDEM TARTRATE 10 MG TAB: 10 | 30 days supply | Qty: 30 | Fill #2

## 2016-07-03 DIAGNOSIS — C50419 Malignant neoplasm of upper-outer quadrant of unspecified female breast: Secondary | ICD-10-CM | POA: Diagnosis not present

## 2016-07-08 DIAGNOSIS — M1711 Unilateral primary osteoarthritis, right knee: Secondary | ICD-10-CM | POA: Diagnosis not present

## 2016-07-14 MED FILL — CARTIA XT 240 MG CAPSULE: 240 | 90 days supply | Qty: 90 | Fill #1

## 2016-07-30 DIAGNOSIS — G4733 Obstructive sleep apnea (adult) (pediatric): Secondary | ICD-10-CM | POA: Diagnosis not present

## 2016-07-30 MED FILL — ZOLPIDEM TARTRATE 10 MG TAB: 10 | 30 days supply | Qty: 30 | Fill #3

## 2016-08-18 ENCOUNTER — Other Ambulatory Visit: Payer: Self-pay | Admitting: *Deleted

## 2016-08-18 MED ORDER — PROPAFENONE HCL ER 225 MG PO CP12: ORAL_CAPSULE | ORAL | 1 refills | Status: DC

## 2016-08-18 MED FILL — PROPAFENONE HCL ER 225 MG C: 225 | 90 days supply | Qty: 180 | Fill #0

## 2016-08-26 DIAGNOSIS — F419 Anxiety disorder, unspecified: Secondary | ICD-10-CM | POA: Diagnosis not present

## 2016-08-29 MED FILL — ALPRAZolam 0.25 MG TABS: 0.25 | 30 days supply | Qty: 30 | Fill #0

## 2016-08-29 MED FILL — ZOLPIDEM TARTRATE 10 MG TAB: 10 | 30 days supply | Qty: 30 | Fill #4

## 2016-09-01 ENCOUNTER — Encounter: Payer: Self-pay | Admitting: Cardiology

## 2016-09-17 ENCOUNTER — Ambulatory Visit: Payer: 59 | Admitting: Cardiology

## 2016-09-18 ENCOUNTER — Encounter: Payer: Self-pay | Admitting: Cardiology

## 2016-09-22 ENCOUNTER — Telehealth: Payer: Self-pay | Admitting: *Deleted

## 2016-09-22 NOTE — Telephone Encounter (Signed)
-----   Message from Sueanne Margarita, MD sent at 09/20/2016 11:27 PM EDT ----- Good AHI and compliance.  Continue current CPAP settings.

## 2016-09-22 NOTE — Telephone Encounter (Signed)
Informed patient of results and patient understanding was verbalized. Patient understands her settings will not change. Patient agrees with the treatment and thanked me. Patient missed her 12 month office visit but says she will call tomorrow (Tuesday) after she views her work schedule to reschedule her 12 month office visit. Patient understands she will need that 12 month office visit to recieve her cpap supplies.

## 2016-10-03 MED FILL — ZOLPIDEM TARTRATE 10 MG TAB: 10 | 30 days supply | Qty: 30 | Fill #5

## 2016-10-03 MED FILL — TAMOXIFEN 20 MG TABLET: 20 | 90 days supply | Qty: 90 | Fill #1

## 2016-10-06 NOTE — Telephone Encounter (Signed)
Patient says she will call back later today to schedule an appointment.

## 2016-10-21 MED FILL — ALPRAZolam 0.25 MG TABS: 0.25 | 30 days supply | Qty: 30 | Fill #1

## 2016-10-21 MED FILL — CARTIA XT 240 MG CAPSULE: 240 | 90 days supply | Qty: 90 | Fill #2

## 2016-10-23 ENCOUNTER — Telehealth: Payer: Self-pay | Admitting: Interventional Cardiology

## 2016-10-23 ENCOUNTER — Ambulatory Visit (HOSPITAL_COMMUNITY)
Admission: EM | Admit: 2016-10-23 | Discharge: 2016-10-23 | Disposition: A | Discharge status: Home / Self Care | Payer: 59 | Attending: Internal Medicine | Admitting: Internal Medicine

## 2016-10-23 ENCOUNTER — Encounter (HOSPITAL_COMMUNITY): Payer: Self-pay | Admitting: Emergency Medicine

## 2016-10-23 DIAGNOSIS — R600 Localized edema: Secondary | ICD-10-CM

## 2016-10-23 DIAGNOSIS — L539 Erythematous condition, unspecified: Secondary | ICD-10-CM

## 2016-10-23 DIAGNOSIS — R21 Rash and other nonspecific skin eruption: Secondary | ICD-10-CM | POA: Diagnosis not present

## 2016-10-23 DIAGNOSIS — T7840XA Allergy, unspecified, initial encounter: Secondary | ICD-10-CM | POA: Diagnosis not present

## 2016-10-23 MED ORDER — HYDROXYZINE HCL 25 MG PO TABS: ORAL_TABLET | ORAL | 0 refills | Status: DC

## 2016-10-23 MED ORDER — DEXAMETHASONE SODIUM PHOSPHATE 10 MG/ML IJ SOLN
10.0000 mg | Freq: Once | INTRAMUSCULAR | Status: AC
  Administered 2016-10-23: 10 mg via INTRAMUSCULAR

## 2016-10-23 MED ORDER — DEXAMETHASONE SODIUM PHOSPHATE 10 MG/ML IJ SOLN
INTRAMUSCULAR | Status: AC
  Filled 2016-10-23: qty 1

## 2016-10-23 MED ORDER — METHYLPREDNISOLONE 4 MG PO TBPK: ORAL_TABLET | ORAL | 0 refills | Status: DC

## 2016-10-23 MED FILL — hydrOXYzine HCL 25 MG TABS: 25 | 3 days supply | Qty: 24 | Fill #0

## 2016-10-23 MED FILL — METHYLPREDNISOLONE 4 MG TAB: 4 | 6 days supply | Qty: 21 | Fill #0

## 2016-10-23 NOTE — Telephone Encounter (Signed)
Called and spoke to [place] from [facility]. She states that the patient came in yesterday to pick up her refill for Cardizem CD 240mg  QD. She states that the patient went out to her car and took the medicine. She states that 45 minutes later that the patient returned with swelling and a rash on her face and neck. Patient advised to take Claritin and then went to work after. She states that the patient went home after work and took benadryl. She states that the patient woke up this morning and took a benadryl and then took another dose of the cardizem and developed periorbital edema and rash on her face and neck. Patient was seen and given Dexamethasone IM and given Medrol dose pack and Hydroxyzine. Patient has been taking cardizem for a couple of years with no reaction. Pharmacy states that the patient has not had any medication or dietary changes and that the manufacturer has not changed. The patient was advised not to take the Cardizem. They are wanting to know if she can take something else. Please advise.

## 2016-10-23 NOTE — ED Triage Notes (Signed)
Ran out of cardizem on Friday and took pill immediately on Monday and rash within 45 minutes. Taking an allergy medicine , but did not seem to help.  Face is red, swollen, and itching.  Patient had a lot of swelling in eyes on waking this morning

## 2016-10-23 NOTE — Telephone Encounter (Signed)
Elmo Putt ( [place]) is calling because Mrs. Feeley may be having an allergic to [place] and wants to switch it to something else . Please call

## 2016-10-23 NOTE — ED Provider Notes (Signed)
CSN: [id number]     Arrival date & time 10/23/16  1001 History   None    Chief Complaint  Patient presents with  . Rash   (Consider location/radiation/quality/duration/timing/severity/associated sxs/prior Treatment) Patient is having swelling in face and rash and this started yesterday and she took a couple benadryl.  She thinks the allergic rxn is due to cardizem bp medicine she is taking.   The history is provided by the patient.  Rash  Location:  Head/neck Head/neck rash location:  Head, R neck and scalp Quality: itchiness and redness   Severity:  Moderate Onset quality:  Sudden Duration:  2 days Timing:  Constant Progression:  Worsening Chronicity:  New Relieved by:  Nothing Worsened by:  Nothing   Past Medical History:  Diagnosis Date  . Arthritis   . Atrial tachycardia ([place])    takes rythmol daily and cardiazem daily  . Back pain    occasionally  . Breast cancer ([place]) 02/22/14   right upper inner  . Chronic anxiety    takes Xanax daily as needed when heart rate going up  . Colitis    hx-one  . Dysrhythmia    atrial tachycardia- takes meds and controls it  . Elevated BP 06/06/2015  . Insomnia    takes Ambien nightly as needed  . Joint pain   . Joint swelling   . S/P radiation therapy 03/28/2014 through 04/27/2014                                03/28/2014 through 04/27/2014                                                                         Right breast 4250 cGy 17 sessions, right breast boost 1000 cGy in 4 sessions (hypofractionated)                         . Sleep apnea    uses a CPAP   Past Surgical History:  Procedure Laterality Date  . BREAST BIOPSY Left 05/02/1999   benign  . BREAST LUMPECTOMY WITH AXILLARY LYMPH NODE BIOPSY Right 02/22/14   upper inner  . COLONOSCOPY    . CYSTOSCOPY    . KNEE ARTHROSCOPY WITH MEDIAL MENISECTOMY Left 03/31/2013   Procedure: LEFT KNEE ARTHROSCOPY WITH PARTIAL MEDIAL MENISECTOMY, CHONDROPLASTY OF PATELLA  FEMORAL  JOINT, EXCISION OF MEDIAL PLICA, PARTIAL LATERAL MENISECTOMY;  Surgeon: [name], MD;  Location: [facility];  Service: Orthopedics;  Laterality: Left;  . TOTAL KNEE ARTHROPLASTY Left 06/29/2013   Procedure: TOTAL KNEE ARTHROPLASTY;  Surgeon: [name], MD;  Location: [facility];  Service: Orthopedics;  Laterality: Left;  . TOTAL KNEE ARTHROPLASTY Right 03/12/2016  . TOTAL KNEE ARTHROPLASTY Right 03/12/2016   Procedure: RIGHT TOTAL KNEE ARTHROPLASTY;  Surgeon: [name], MD;  Location: [facility];  Service: Orthopedics;  Laterality: Right;  . TUBAL LIGATION     Family History  Problem Relation Age of Onset  . Heart disease Father   . Hypertension Father   . Diabetes Father   . Heart attack Neg Hx    Social History  Substance Use Topics  . Smoking status: Never Smoker  . Smokeless tobacco: Never Used  . Alcohol use Yes     Comment: socailly   OB History    No data available     Review of Systems  Constitutional: Negative.   HENT: Negative.   Eyes: Negative.   Respiratory: Negative.   Cardiovascular: Negative.   Gastrointestinal: Negative.   Endocrine: Negative.   Genitourinary: Negative.   Musculoskeletal: Negative.   Skin: Positive for rash.  Allergic/Immunologic: Negative.   Neurological: Negative.   Hematological: Negative.   Psychiatric/Behavioral: Negative.     Allergies  Escitalopram oxalate  Home Medications   Prior to Admission medications   Medication Sig Start Date End Date Taking? Authorizing Provider  ALPRAZolam Duanne Moron) 0.25 MG tablet Take 0.25 mg by mouth daily as needed for anxiety.  11/19/15   [provider]  aspirin EC 325 MG EC tablet Take 1 tablet (325 mg total) by mouth daily with breakfast. 03/14/16   Benedetto Goad, PA-C  diltiazem (CARDIZEM CD) 240 MG 24 hr capsule Take 1 capsule (240 mg total) by mouth daily. 03/07/16 06/05/16  Jettie Booze, MD  hydrOXYzine (ATARAX/VISTARIL) 25 MG tablet One to two po q 6  hours prn rash 10/23/16   Lysbeth Penner, FNP  methocarbamol (ROBAXIN) 500 MG tablet Take 1 tablet (500 mg total) by mouth every 6 (six) hours as needed for muscle spasms. 03/13/16   Benedetto Goad, PA-C  methylPREDNISolone (MEDROL DOSEPAK) 4 MG TBPK tablet Take 6-5-4-3-2-1 po qd 10/23/16   Lysbeth Penner, FNP  propafenone (RYTHMOL SR) 225 MG 12 hr capsule TAKE 1 CAPSULE BY MOUTH EVERY 12 HOURS 08/18/16   Jettie Booze, MD  rosuvastatin (CRESTOR) 5 MG tablet Take 1 tablet (5 mg total) by mouth daily. 06/13/15   Magrinat, Virgie Dad, MD  tamoxifen (NOLVADEX) 20 MG tablet Take 1 tablet (20 mg total) by mouth daily. 06/26/16   Magrinat, Virgie Dad, MD  VITAMIN D, CHOLECALCIFEROL, PO Take 1,000 Units by mouth daily.     [provider]  zolpidem (AMBIEN) 10 MG tablet Take 10 mg by mouth at bedtime as needed for sleep.     [provider]   Meds Ordered and Administered this Visit   Medications  dexamethasone (DECADRON) injection 10 mg (not administered)    BP (!) 147/73 (BP Location: Right Arm)   Pulse 98   Temp 98.8 F (37.1 C) (Oral)   Resp 18   SpO2 98%  No data found.   Physical Exam  Constitutional: She appears well-developed and well-nourished.  HENT:  Head: Normocephalic and atraumatic.  Eyes: Conjunctivae and EOM are normal. Pupils are equal, round, and reactive to light.  Neck: Normal range of motion. Neck supple.  Cardiovascular: Normal rate, regular rhythm and normal heart sounds.   Pulmonary/Chest: Effort normal and breath sounds normal.  Skin: Rash noted. There is erythema.  Periorbital edema and erythema on face neck and forehead.  Nursing note and vitals reviewed.   Urgent Care Course     Procedures (including critical care time)  Labs Review Labs Reviewed - No data to display  Imaging Review No results found.   Visual Acuity Review  Right Eye Distance:   Left Eye Distance:   Bilateral Distance:    Right Eye Near:   Left Eye  Near:    Bilateral Near:         MDM   1. Rash   2. Periorbital  edema   3. Allergic state, initial encounter    Dexamethasone 10mg  IM Medrol dose pack as directed 4mg  #21 Hydroxyzine 25mg  one to two po q 6 hours prn #24      Lysbeth Penner, FNP 10/23/16 1053

## 2016-10-23 NOTE — Telephone Encounter (Signed)
Rash and edema are reported with Diltiazem - though the edema is usually lower extremity edema rather than facial edema. It does seem strange this would develop so suddenly and severely since she has been taking for several years. I would avoid other calcium channel blockers like verapamil. Could consider a beta blocker.

## 2016-10-24 MED ORDER — METOPROLOL SUCCINATE ER 50 MG PO TB24: 50.0000 mg | ORAL_TABLET | Freq: Every day | ORAL | 3 refills | Status: DC

## 2016-10-24 MED FILL — METOPROLOL SUCC ER 50 MG TA: 50 | 30 days supply | Qty: 30 | Fill #0

## 2016-10-24 NOTE — Telephone Encounter (Signed)
Discussed with Dr. Radford Pax and she wants patient to start Toprol 50 mg QD in place of the Cardizem and to monitor her BP and HR for 1 week and call us with the readings.   Called and discussed Dr. Theodosia Blender recommendations with the patient. Rx sent to patient's preferred pharmacy. Patient verbalized understanding, was in agreement with this plan, and appreciated the call.

## 2016-10-24 NOTE — Telephone Encounter (Signed)
Patient calling to see if her Cardizem could be switched to something different today so that she can get it and start if before the weekend. Patient denies having any paplitations or any other symptoms since stopping the Cardizem yesterday. Patient continues to take the Rythmol. Patient made aware that the message has been sent to Dr. Irish Lack for review. Patient asking if Dr. Radford Pax can change it since she sees her as well. Will discuss with Dr. Radford Pax.

## 2016-10-28 ENCOUNTER — Telehealth: Payer: Self-pay | Admitting: Cardiology

## 2016-10-28 ENCOUNTER — Encounter: Payer: Self-pay | Admitting: *Deleted

## 2016-10-28 NOTE — Telephone Encounter (Signed)
Patient states that she is returning your call, and would like to know if she waits to see Dr. Radford Pax in September, would that be okay?

## 2016-10-28 NOTE — Telephone Encounter (Signed)
Patient reports she was offered an appointment in September but will need follow-up prior to then for CPAP supplies. Scheduled patient July 18 at 1500. Patient was grateful for call.

## 2016-10-28 NOTE — Telephone Encounter (Signed)
Left message to call back 5/25, 5/29, 6/5,. Today no contact letter sent.

## 2016-11-10 MED FILL — ZOLPIDEM TARTRATE 10 MG TAB: 10 | 30 days supply | Qty: 30 | Fill #0

## 2016-11-13 MED FILL — PROPAFENONE HCL ER 225 MG C: 225 | 90 days supply | Qty: 180 | Fill #1

## 2016-11-21 MED FILL — METOPROLOL SUCC ER 50 MG TA: 50 | 90 days supply | Qty: 90 | Fill #1

## 2016-11-21 MED FILL — ALPRAZolam 0.25 MG TABS: 0.25 | 30 days supply | Qty: 30 | Fill #2

## 2016-12-01 DIAGNOSIS — G4733 Obstructive sleep apnea (adult) (pediatric): Secondary | ICD-10-CM | POA: Diagnosis not present

## 2016-12-03 ENCOUNTER — Telehealth: Payer: Self-pay | Admitting: Cardiology

## 2016-12-03 NOTE — Telephone Encounter (Addendum)
Patient called to report she had an episode last night where her HR became elevated and her arms and legs started shaking for about 45 seconds.  When this occurred, she was having an argument with her husband about her mother-in-law who recently moved in with them. She states she was very excited and "crazy" during the argument when this occurred. Her husband got scared during the episode and wanted to take her to the ER. She went to lie down. She checked her HR and it was in the 90s (since changing to Toprol it is usually in the 70s). She took a Xanax, went to bed, and is asymptomatic today. She states she has been extremely stressed at home.  She has an appointment with Dr. Radford Pax next week and will talk to her about her episode then, but thinks it was anxiety driven. She understands she will be called prior to appointment if Dr. Irish Lack or Dr. Radford Pax have recommendations prior to that time.

## 2016-12-03 NOTE — Telephone Encounter (Signed)
New message      Pt had an "episode" yesterday where she got very upset.  During this time she experienced arms and hands shaking.  Her heart rate went up some but no sob. Pt took a xanax and went to bed.  Pt would like to talk to the nurse.

## 2016-12-03 NOTE — Telephone Encounter (Signed)
I would have her followup with her PCP about getting something for anxiety

## 2016-12-09 ENCOUNTER — Encounter: Payer: Self-pay | Admitting: *Deleted

## 2016-12-09 ENCOUNTER — Telehealth: Payer: Self-pay | Admitting: *Deleted

## 2016-12-09 NOTE — Telephone Encounter (Signed)
nformed patient of compliance results and verbalized understanding was indicated. Patient understands her settings will not change. Patient thanked me for the call.

## 2016-12-09 NOTE — Telephone Encounter (Signed)
-----   Message from Sueanne Margarita, MD sent at 12/09/2016  5:43 PM EDT ----- Good AHI and compliance.  Continue current CPAP settings.

## 2016-12-10 ENCOUNTER — Ambulatory Visit (INDEPENDENT_AMBULATORY_CARE_PROVIDER_SITE_OTHER): Payer: 59 | Admitting: Cardiology

## 2016-12-10 ENCOUNTER — Encounter: Payer: Self-pay | Admitting: Cardiology

## 2016-12-10 VITALS — BP 120/75 | HR 74 | Ht 67.0 in | Wt 189.0 lb

## 2016-12-10 DIAGNOSIS — G4733 Obstructive sleep apnea (adult) (pediatric): Secondary | ICD-10-CM | POA: Diagnosis not present

## 2016-12-10 DIAGNOSIS — I471 Supraventricular tachycardia: Secondary | ICD-10-CM | POA: Diagnosis not present

## 2016-12-10 DIAGNOSIS — E663 Overweight: Secondary | ICD-10-CM | POA: Diagnosis not present

## 2016-12-10 HISTORY — DX: Overweight: E66.3

## 2016-12-10 NOTE — Patient Instructions (Signed)

## 2016-12-10 NOTE — Progress Notes (Signed)
Cardiology Office Note    Date:  12/10/2016   ID:  Pamela Underwood, DOB 1955/11/29, MRN [id number]  PCP:  Everlene Farrier, MD  Cardiologist:  Fransico Him, MD   Chief Complaint  Patient presents with  . Sleep Apnea    History of Present Illness:  Pamela Underwood is a 61 y.o. female with a history of SVT and  moderate OSA now on CPAP. She uses a full face mask and tolerates it well. She sleeps well at night but has never really felt rested when she gets up in the am even after starting CPAP. She denies any daytime sleepiness and does not nap. She does not snore with the CPAP.  She occasionally has some mild mouth dryness.  She has not had any more problems with her SVT.  Her Cardizem had to be stopped due to a rash and is now on a BB and her SVT has been suppressed very well.     Past Medical History:  Diagnosis Date  . Arthritis   . Atrial tachycardia ([place])   . Back pain    occasionally  . Breast cancer ([place]) 02/22/14   right upper inner  . Chronic anxiety    takes Xanax daily as needed when heart rate going up  . Colitis    hx-one  . Insomnia    takes Ambien nightly as needed  . Joint pain   . Joint swelling   . Overweight (BMI 25.0-29.9) 12/10/2016  . S/P radiation therapy 03/28/2014 through 04/27/2014                                03/28/2014 through 04/27/2014                                                                         Right breast 4250 cGy 17 sessions, right breast boost 1000 cGy in 4 sessions (hypofractionated)                         . Sleep apnea    uses a CPAP    Past Surgical History:  Procedure Laterality Date  . BREAST BIOPSY Left 05/02/1999   benign  . BREAST LUMPECTOMY WITH AXILLARY LYMPH NODE BIOPSY Right 02/22/14   upper inner  . COLONOSCOPY    . CYSTOSCOPY    . KNEE ARTHROSCOPY WITH MEDIAL MENISECTOMY Left 03/31/2013   Procedure: LEFT KNEE ARTHROSCOPY WITH PARTIAL MEDIAL MENISECTOMY, CHONDROPLASTY OF PATELLA  FEMORAL JOINT, EXCISION  OF MEDIAL PLICA, PARTIAL LATERAL MENISECTOMY;  Surgeon: [name], MD;  Location: [facility];  Service: Orthopedics;  Laterality: Left;  . TOTAL KNEE ARTHROPLASTY Left 06/29/2013   Procedure: TOTAL KNEE ARTHROPLASTY;  Surgeon: [name], MD;  Location: [facility];  Service: Orthopedics;  Laterality: Left;  . TOTAL KNEE ARTHROPLASTY Right 03/12/2016  . TOTAL KNEE ARTHROPLASTY Right 03/12/2016   Procedure: RIGHT TOTAL KNEE ARTHROPLASTY;  Surgeon: [name], MD;  Location: [facility];  Service: Orthopedics;  Laterality: Right;  . TUBAL LIGATION      Current Medications: Current Meds  Medication Sig  . ALPRAZolam (XANAX) 0.25 MG  tablet Take 0.25 mg by mouth daily as needed for anxiety.   Marland Kitchen aspirin EC 325 MG EC tablet Take 1 tablet (325 mg total) by mouth daily with breakfast.  . metoprolol succinate (TOPROL-XL) 50 MG 24 hr tablet Take 1 tablet (50 mg total) by mouth daily. Take with or immediately following a meal.  . propafenone (RYTHMOL SR) 225 MG 12 hr capsule TAKE 1 CAPSULE BY MOUTH EVERY 12 HOURS  . rosuvastatin (CRESTOR) 5 MG tablet Take 1 tablet (5 mg total) by mouth daily.  . tamoxifen (NOLVADEX) 20 MG tablet Take 1 tablet (20 mg total) by mouth daily.  Marland Kitchen VITAMIN D, CHOLECALCIFEROL, PO Take 1,000 Units by mouth daily.   Marland Kitchen zolpidem (AMBIEN) 10 MG tablet Take 10 mg by mouth at bedtime as needed for sleep.     Allergies:   Escitalopram oxalate   Social History   Social History  . Marital status: Married    Spouse name: N/A  . Number of children: N/A  . Years of education: N/A   Social History Main Topics  . Smoking status: Never Smoker  . Smokeless tobacco: Never Used  . Alcohol use Yes     Comment: socailly  . Drug use: No  . Sexual activity: Yes    Birth control/ protection: Post-menopausal     Comment: menarche age 29, g41, P67, 46st live birth age 44, menopause age [age]   Other Topics Concern  . None   Social History Narrative  . None      Family History:  The patient's family history includes Diabetes in her father; Heart disease in her father; Hypertension in her father.   ROS:   Please see the history of present illness.    ROS All other systems reviewed and are negative.  No flowsheet data found.     PHYSICAL EXAM:   VS:  BP 120/75   Pulse 74   Ht 5\' 7"  (1.702 m)   Wt 189 lb (85.7 kg)   SpO2 98%   BMI 29.60 kg/m    GEN: Well nourished, well developed, in no acute distress  HEENT: normal  Neck: no JVD, carotid bruits, or masses Cardiac: RRR; no murmurs, rubs, or gallops,no edema.  Intact distal pulses bilaterally.  Respiratory:  clear to auscultation bilaterally, normal work of breathing GI: soft, nontender, nondistended, + BS MS: no deformity or atrophy  Skin: warm and dry, no rash Neuro:  Alert and Oriented x 3, Strength and sensation are intact Psych: euthymic mood, full affect  Wt Readings from Last 3 Encounters:  12/10/16 189 lb (85.7 kg)  06/26/16 188 lb 9.6 oz (85.5 kg)  03/12/16 190 lb 12.8 oz (86.5 kg)      Studies/Labs Reviewed:   EKG:  EKG is not ordered today.   Recent Labs: 02/29/2016: ALT 20 03/14/2016: BUN 14; Creatinine, Ser 0.64; Hemoglobin 10.6; Platelets 146; Potassium 3.9; Sodium 137   Lipid Panel    Component Value Date/Time   CHOL 254 (H) 06/12/2014 0837   TRIG 84 06/12/2014 0837   HDL 84 06/12/2014 0837   CHOLHDL 3.0 06/12/2014 0837   VLDL 17 06/12/2014 0837   LDLCALC 153 (H) 06/12/2014 0837    Additional studies/ records that were reviewed today include:  CPAP download    ASSESSMENT:    1. OSA (obstructive sleep apnea)   2. Overweight (BMI 25.0-29.9)   3. Paroxysmal supraventricular tachycardia (HCC)      PLAN:  In order of problems listed above:  OSA - the patient is tolerating PAP therapy well without any problems. The PAP download was reviewed today and showed an AHI of 1.2/hr on 10 cm H2O with 73% compliance in using more than 4 hours nightly.   The patient has been using and benefiting from CPAP use and will continue to benefit from therapy.  Overweight - she is starting to walk again.  I have encouraged her to get back into the gym and cut back on carbs and portions.  SVT - she has not had any reoccurrence of her SVT.  She will continue on BB and propafenone.      Medication Adjustments/Labs and Tests Ordered: Current medicines are reviewed at length with the patient today.  Concerns regarding medicines are outlined above.  Medication changes, Labs and Tests ordered today are listed in the Patient Instructions below.  There are no Patient Instructions on file for this visit.   Signed, Fransico Him, MD  12/10/2016 3:24 PM    [facility] [address], [place], [place]  [address] Phone: [phone]; Fax: [phone]

## 2016-12-17 MED FILL — ZOLPIDEM TARTRATE 10 MG TAB: 10 | 30 days supply | Qty: 30 | Fill #0

## 2016-12-25 MED FILL — TAMOXIFEN 20 MG TABLET: 20 | 90 days supply | Qty: 90 | Fill #2

## 2016-12-25 MED FILL — ALPRAZolam 0.25 MG TABS: 0.25 | 30 days supply | Qty: 30 | Fill #3

## 2017-01-13 ENCOUNTER — Other Ambulatory Visit: Payer: Self-pay | Admitting: Oncology

## 2017-01-13 MED FILL — ROSUVASTATIN CALCIUM 5 MG T: 5 | 90 days supply | Qty: 90 | Fill #0

## 2017-01-16 MED FILL — ZOLPIDEM TARTRATE 10 MG TAB: 10 | 30 days supply | Qty: 30 | Fill #1

## 2017-01-27 DIAGNOSIS — Z01419 Encounter for gynecological examination (general) (routine) without abnormal findings: Secondary | ICD-10-CM | POA: Diagnosis not present

## 2017-01-27 DIAGNOSIS — Z6830 Body mass index (BMI) 30.0-30.9, adult: Secondary | ICD-10-CM | POA: Diagnosis not present

## 2017-02-05 ENCOUNTER — Other Ambulatory Visit: Payer: Self-pay | Admitting: Interventional Cardiology

## 2017-02-05 MED FILL — PROPAFENONE HCL ER 225 MG C: 225 | 15 days supply | Qty: 30 | Fill #0

## 2017-02-16 MED FILL — ZOLPIDEM TARTRATE 10 MG TAB: 10 | 30 days supply | Qty: 30 | Fill #2

## 2017-02-16 MED FILL — ALPRAZolam 0.25 MG TABS: 0.25 | 30 days supply | Qty: 30 | Fill #0

## 2017-02-27 ENCOUNTER — Other Ambulatory Visit: Payer: Self-pay | Admitting: Cardiology

## 2017-02-27 MED FILL — METOPROLOL SUCC ER 50 MG TA: 50 | 30 days supply | Qty: 30 | Fill #0

## 2017-03-02 ENCOUNTER — Other Ambulatory Visit: Payer: Self-pay | Admitting: Interventional Cardiology

## 2017-03-02 MED FILL — PROPAFENONE HCL ER 225 MG C: 225 | 15 days supply | Qty: 30 | Fill #0

## 2017-03-10 DIAGNOSIS — G4733 Obstructive sleep apnea (adult) (pediatric): Secondary | ICD-10-CM | POA: Diagnosis not present

## 2017-03-18 ENCOUNTER — Ambulatory Visit (INDEPENDENT_AMBULATORY_CARE_PROVIDER_SITE_OTHER): Payer: 59 | Admitting: Interventional Cardiology

## 2017-03-18 ENCOUNTER — Encounter: Payer: Self-pay | Admitting: Interventional Cardiology

## 2017-03-18 VITALS — BP 142/86 | HR 68 | Ht 67.0 in | Wt 191.4 lb

## 2017-03-18 DIAGNOSIS — I471 Supraventricular tachycardia: Secondary | ICD-10-CM | POA: Diagnosis not present

## 2017-03-18 DIAGNOSIS — E7800 Pure hypercholesterolemia, unspecified: Secondary | ICD-10-CM

## 2017-03-18 DIAGNOSIS — R030 Elevated blood-pressure reading, without diagnosis of hypertension: Secondary | ICD-10-CM

## 2017-03-18 MED ORDER — PROPAFENONE HCL ER 225 MG PO CP12: ORAL_CAPSULE | ORAL | 11 refills | Status: DC

## 2017-03-18 MED FILL — PROPAFENONE HCL ER 225 MG C: 225 | 30 days supply | Qty: 60 | Fill #0

## 2017-03-18 NOTE — Patient Instructions (Signed)

## 2017-03-18 NOTE — Progress Notes (Signed)
Cardiology Office Note   Date:  03/18/2017   ID:  Lamyah, Creed [birth year], MRN [id number]  PCP:  Everlene Farrier, MD    No chief complaint on file.  SVT control  Wt Readings from Last 3 Encounters:  03/18/17 191 lb 6.4 oz (86.8 kg)  12/10/16 189 lb (85.7 kg)  06/26/16 188 lb 9.6 oz (85.5 kg)       History of Present Illness: Pamela Underwood is a 61 y.o. female   who has had SVT. Controlled on propafenone and diltiazem.  She has declined evaluation for ablation in the past. She had breast cancer treated with surgery and radiation.  She had no SVT with that process.   She had a TKR in October of 2017.   Follws up with Dr. Jana Hakim for her Breast cancer history.  She had a reaction to diltiazem and was swtiched to toprol with decrease in SVT.  Lipids to be checked with Dr. Gertie Fey.    Still working at the [employer], mostly orthopedics.    Past Medical History:  Diagnosis Date  . Arthritis   . Atrial tachycardia ([place])   . Back pain    occasionally  . Breast cancer ([place]) 02/22/14   right upper inner  . Chronic anxiety    takes Xanax daily as needed when heart rate going up  . Colitis    hx-one  . Insomnia    takes Ambien nightly as needed  . Joint pain   . Joint swelling   . Overweight (BMI 25.0-29.9) 12/10/2016  . S/P radiation therapy 03/28/2014 through 04/27/2014                                03/28/2014 through 04/27/2014                                                                         Right breast 4250 cGy 17 sessions, right breast boost 1000 cGy in 4 sessions (hypofractionated)                         . Sleep apnea    uses a CPAP    Past Surgical History:  Procedure Laterality Date  . BREAST BIOPSY Left 05/02/1999   benign  . BREAST LUMPECTOMY WITH AXILLARY LYMPH NODE BIOPSY Right 02/22/14   upper inner  . COLONOSCOPY    . CYSTOSCOPY    . KNEE ARTHROSCOPY WITH MEDIAL MENISECTOMY Left 03/31/2013   Procedure: LEFT KNEE ARTHROSCOPY WITH  PARTIAL MEDIAL MENISECTOMY, CHONDROPLASTY OF PATELLA  FEMORAL JOINT, EXCISION OF MEDIAL PLICA, PARTIAL LATERAL MENISECTOMY;  Surgeon: [name], MD;  Location: [facility];  Service: Orthopedics;  Laterality: Left;  . TOTAL KNEE ARTHROPLASTY Left 06/29/2013   Procedure: TOTAL KNEE ARTHROPLASTY;  Surgeon: [name], MD;  Location: [facility];  Service: Orthopedics;  Laterality: Left;  . TOTAL KNEE ARTHROPLASTY Right 03/12/2016  . TOTAL KNEE ARTHROPLASTY Right 03/12/2016   Procedure: RIGHT TOTAL KNEE ARTHROPLASTY;  Surgeon: [name], MD;  Location: [facility];  Service: Orthopedics;  Laterality: Right;  . TUBAL LIGATION  Current Outpatient Prescriptions  Medication Sig Dispense Refill  . ALPRAZolam (XANAX) 0.25 MG tablet Take 0.25 mg by mouth daily as needed for anxiety.   4  . aspirin 81 MG tablet Take 81 mg by mouth daily.    . metoprolol succinate (TOPROL-XL) 50 MG 24 hr tablet TAKE 1 TABLET BY MOUTH DAILY WITH OR IMMEDIATELY FOLLOWING A MEAL 30 tablet 0  . propafenone (RYTHMOL SR) 225 MG 12 hr capsule TAKE 1 CAPSULE BY MOUTH EVERY 12 HOUR **SCHEDULE APPT FOR FURTHER REFILLS** 30 capsule 2  . rosuvastatin (CRESTOR) 5 MG tablet TAKE 1 TABLET BY MOUTH DAILY. 90 tablet 2  . tamoxifen (NOLVADEX) 20 MG tablet Take 1 tablet (20 mg total) by mouth daily. 90 tablet 3  . VITAMIN D, CHOLECALCIFEROL, PO Take 1,000 Units by mouth daily.     Marland Kitchen zolpidem (AMBIEN) 10 MG tablet Take 10 mg by mouth at bedtime as needed for sleep.      No current facility-administered medications for this visit.     Allergies:   Escitalopram oxalate    Social History:  The patient  reports that she has never smoked. She has never used smokeless tobacco. She reports that she drinks alcohol. She reports that she does not use drugs.   Family History:  The patient's family history includes Diabetes in her father; Heart disease in her father; Hypertension in her father.    ROS:  Please see the  history of present illness.   Otherwise, review of systems are positive for improved palptiations since switching to Toprol, resting HR has decreased.   All other systems are reviewed and negative.    PHYSICAL EXAM: VS:  BP (!) 142/86   Pulse 68   Ht 5\' 7"  (1.702 m)   Wt 191 lb 6.4 oz (86.8 kg)   SpO2 98%   BMI 29.98 kg/m  , BMI Body mass index is 29.98 kg/m. GEN: Well nourished, well developed, in no acute distress  HEENT: normal  Neck: no JVD, carotid bruits, or masses Cardiac: RRR; no murmurs, rubs, or gallops,no edema  Respiratory:  clear to auscultation bilaterally, normal work of breathing GI: soft, nontender, nondistended, + BS MS: no deformity or atrophy  Skin: warm and dry, no rash Neuro:  Strength and sensation are intact Psych: euthymic mood, full affect   EKG:   The ekg ordered today demonstrates NSR, normal QT, no ST changes   Recent Labs: No results found for requested labs within last 8760 hours.   Lipid Panel    Component Value Date/Time   CHOL 254 (H) 06/12/2014 0837   TRIG 84 06/12/2014 0837   HDL 84 06/12/2014 0837   CHOLHDL 3.0 06/12/2014 0837   VLDL 17 06/12/2014 0837   LDLCALC 153 (H) 06/12/2014 0837     Other studies Reviewed: Additional studies/ records that were reviewed today with results demonstrating: .   ASSESSMENT AND PLAN:  1. SVT: Continue Rhythmol and Toprol.  Refill sent in.  Sx controled. She has been on Rhythmol or many years. 2. Hyperlipidemia: TO be checked with Dr. Gertie Fey. Continue Crestor.  LDL 153 in 2016.   3. Sister with vascular disease, diagnosed with carotid disease.  4. Borderline BP today. Better controlled at other visits,  Increase exercise.    Current medicines are reviewed at length with the patient today.  The patient concerns regarding her medicines were addressed.  The following changes have been made:  No change  Labs/ tests ordered today include:  No  orders of the defined types were placed in this  encounter.   Recommend 150 minutes/week of aerobic exercise Low fat, low carb, high fiber diet recommended  Disposition:   FU in 1 year   Signed, Larae Grooms, MD  03/18/2017 4:27 PM    [facility] [place], [place], [place]  [address] Phone: [phone]; Fax: [phone]

## 2017-03-19 MED FILL — ZOLPIDEM TARTRATE 10 MG TAB: 10 | 30 days supply | Qty: 30 | Fill #3

## 2017-03-19 MED FILL — ALPRAZolam 0.25 MG TABS: 0.25 | 30 days supply | Qty: 30 | Fill #1

## 2017-03-25 DIAGNOSIS — R928 Other abnormal and inconclusive findings on diagnostic imaging of breast: Secondary | ICD-10-CM | POA: Diagnosis not present

## 2017-03-25 DIAGNOSIS — Z853 Personal history of malignant neoplasm of breast: Secondary | ICD-10-CM | POA: Diagnosis not present

## 2017-03-30 MED FILL — TAMOXIFEN CITRATE 20 MG TAB: 20 | 90 days supply | Qty: 90 | Fill #3

## 2017-04-02 ENCOUNTER — Other Ambulatory Visit: Payer: Self-pay | Admitting: Cardiology

## 2017-04-02 MED FILL — METOPROLOL SUCC ER 50 MG TA: 50 | 30 days supply | Qty: 30 | Fill #0

## 2017-04-15 MED FILL — PROPAFENONE HCL ER 225 MG C: 225 | 30 days supply | Qty: 60 | Fill #1

## 2017-04-16 IMAGING — CR DG KNEE 1-2V PORT*R*
4 series · 4 of 4 positions shown · non-contrast
Comparison: None

CLINICAL DATA: Postop knee replacement

EXAM:
PORTABLE RIGHT KNEE - 1-2 VIEW

[ap]
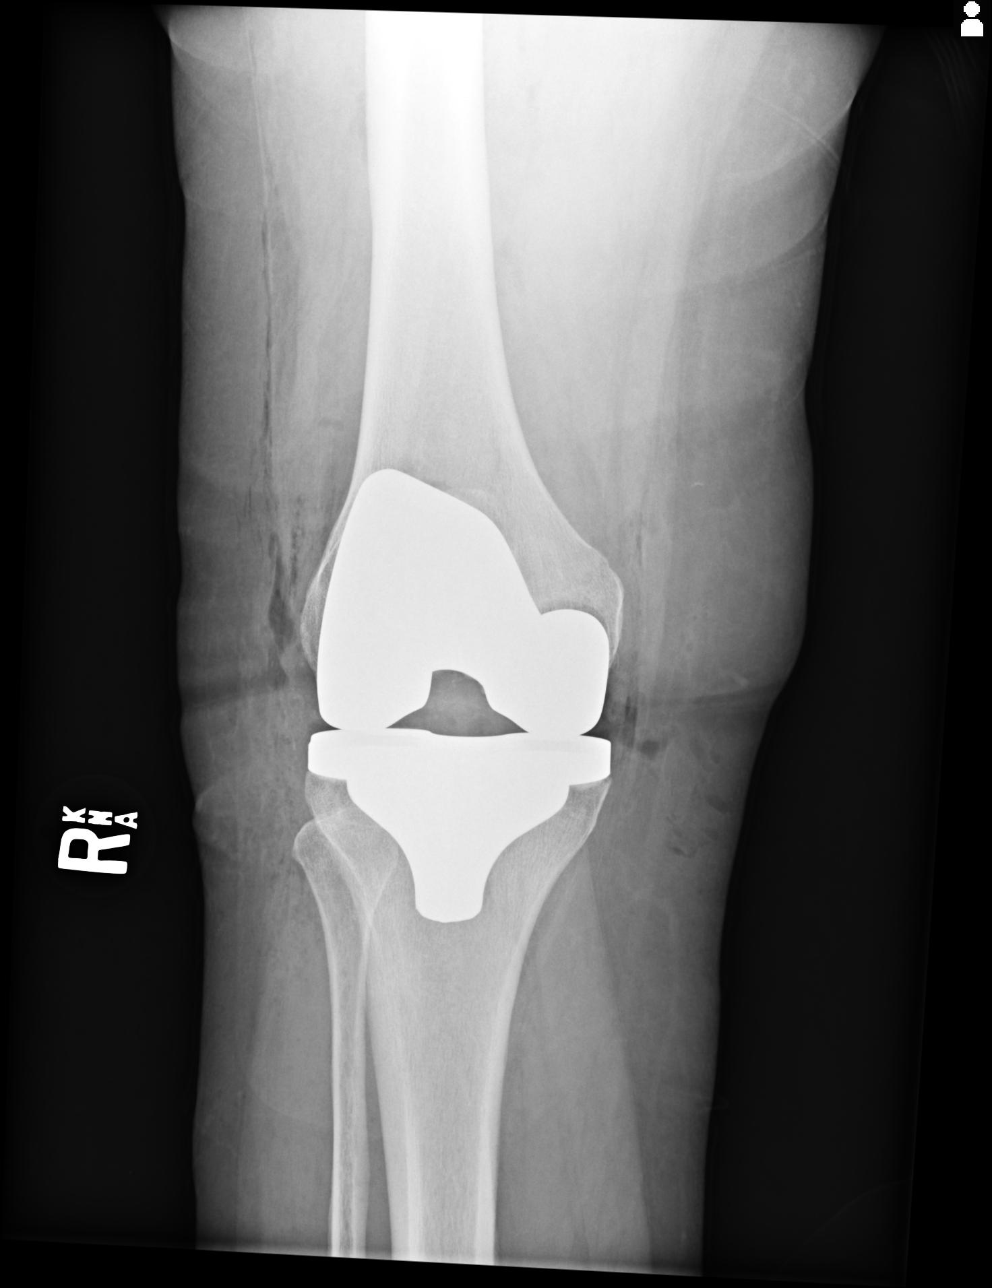

[lat]
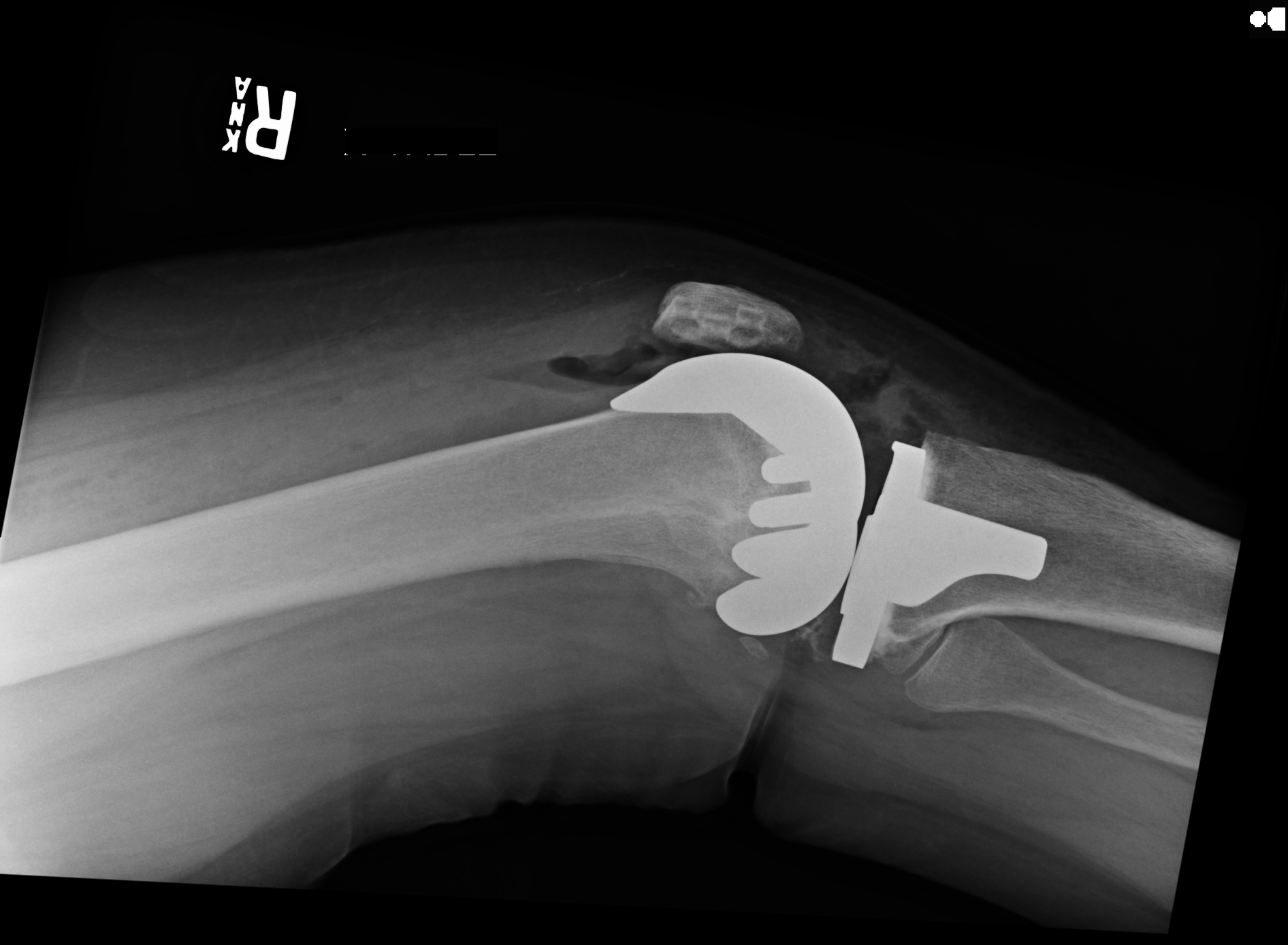

[oblique (1 of 2)]
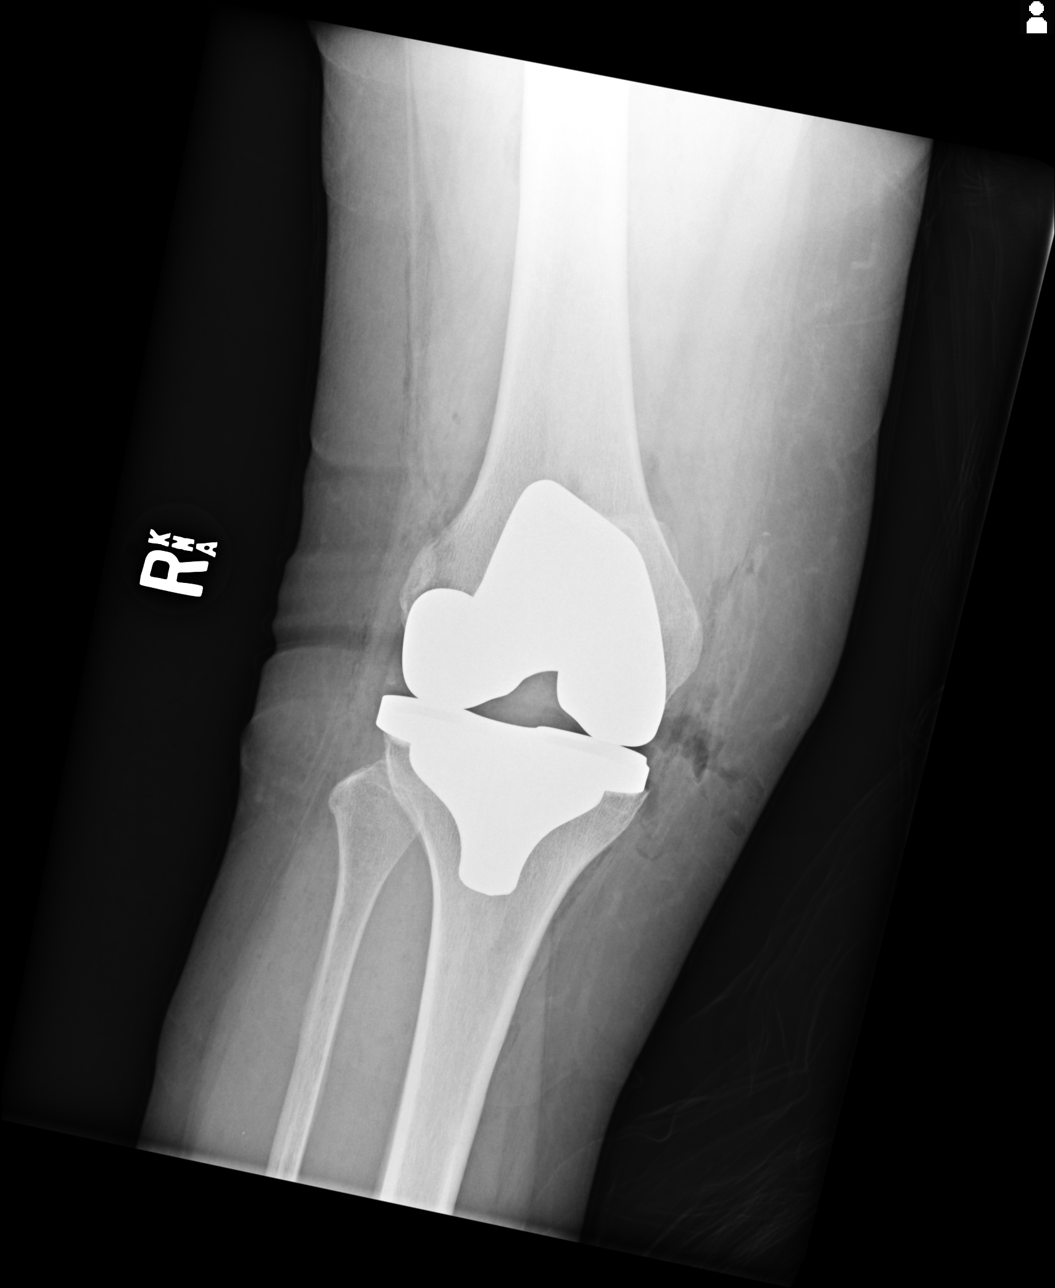

[oblique (2 of 2)]
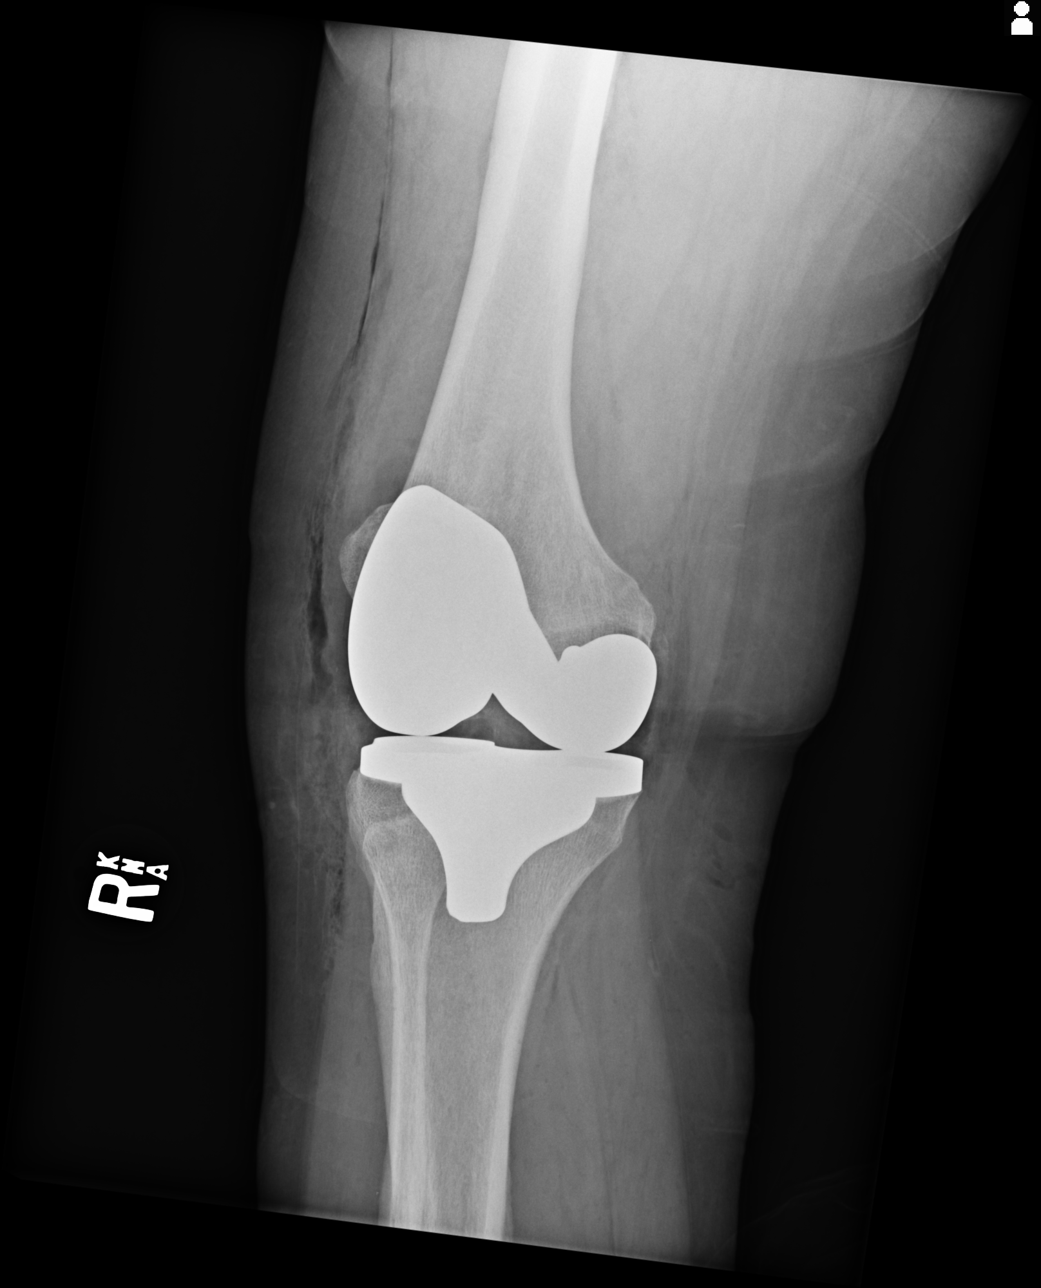

[4 of 4 positions shown; findings below may reference images not displayed]

FINDINGS: Components of RIGHT knee prosthesis identified.

No acute fracture, dislocation, or bone destruction.

Bones appear slightly demineralized.

Expected soft tissue swelling and soft tissue gas from preceding
surgery.
IMPRESSION: RIGHT knee prosthesis without acute complication.

## 2017-04-21 MED FILL — ZOLPIDEM TARTRATE 10 MG TAB: 10 | 30 days supply | Qty: 30 | Fill #4

## 2017-04-21 MED FILL — ALPRAZolam 0.25 MG TABS: 0.25 | 30 days supply | Qty: 30 | Fill #2

## 2017-05-07 MED FILL — METOPROLOL SUCC ER 50 MG TA: 50 | 30 days supply | Qty: 30 | Fill #1

## 2017-05-15 MED FILL — PROPAFENONE HCL ER 225 MG C: 225 | 30 days supply | Qty: 60 | Fill #2

## 2017-05-15 MED FILL — ROSUVASTATIN CALCIUM 5 MG T: 5 | 90 days supply | Qty: 90 | Fill #1

## 2017-05-22 MED FILL — ALPRAZolam 0.25 MG TABS: 0.25 | 30 days supply | Qty: 30 | Fill #3

## 2017-05-25 MED FILL — ZOLPIDEM TARTRATE 10 MG TAB: 10 | 30 days supply | Qty: 30 | Fill #0

## 2017-06-09 MED FILL — METOPROLOL SUCC ER 50 MG TA: 50 | 30 days supply | Qty: 30 | Fill #2

## 2017-06-22 MED FILL — PROPAFENONE HCL ER 225 MG C: 225 | 30 days supply | Qty: 60 | Fill #3

## 2017-06-22 MED FILL — ZOLPIDEM TARTRATE 10 MG TAB: 10 | 30 days supply | Qty: 30 | Fill #1

## 2017-07-07 ENCOUNTER — Other Ambulatory Visit: Payer: Self-pay

## 2017-07-07 DIAGNOSIS — C50211 Malignant neoplasm of upper-inner quadrant of right female breast: Secondary | ICD-10-CM

## 2017-07-07 DIAGNOSIS — Z13220 Encounter for screening for lipoid disorders: Secondary | ICD-10-CM

## 2017-07-07 DIAGNOSIS — Z170 Estrogen receptor positive status [ER+]: Secondary | ICD-10-CM

## 2017-07-08 ENCOUNTER — Other Ambulatory Visit: Payer: 59

## 2017-07-08 ENCOUNTER — Ambulatory Visit: Payer: 59 | Admitting: Oncology

## 2017-07-08 ENCOUNTER — Telehealth: Payer: Self-pay | Admitting: Oncology

## 2017-07-08 ENCOUNTER — Other Ambulatory Visit: Payer: Self-pay | Admitting: Oncology

## 2017-07-08 DIAGNOSIS — C50211 Malignant neoplasm of upper-inner quadrant of right female breast: Secondary | ICD-10-CM

## 2017-07-08 DIAGNOSIS — Z170 Estrogen receptor positive status [ER+]: Principal | ICD-10-CM

## 2017-07-08 MED FILL — TAMOXIFEN CITRATE 20 MG TAB: 20 | 90 days supply | Qty: 90 | Fill #0

## 2017-07-08 MED FILL — METOPROLOL SUCCINATE ER 50: 50 | 30 days supply | Qty: 30 | Fill #3

## 2017-07-08 MED FILL — ALPRAZolam 0.25 MG TABS: 0.25 | 30 days supply | Qty: 30 | Fill #0

## 2017-07-08 NOTE — Telephone Encounter (Signed)
Patient left a vm to cancel appointments

## 2017-07-23 MED FILL — PROPAFENONE HCL ER 225 MG C: 225 | 30 days supply | Qty: 60 | Fill #4

## 2017-07-23 MED FILL — ZOLPIDEM TARTRATE 10 MG TAB: 10 | 30 days supply | Qty: 30 | Fill #2

## 2017-08-07 MED FILL — METOPROLOL SUCCINATE ER 50: 50 | 30 days supply | Qty: 30 | Fill #4

## 2017-08-10 MED FILL — AMOXICILLIN 500 MG CAPSULE: 500 | 1 days supply | Qty: 4 | Fill #0

## 2017-08-11 MED FILL — ROSUVASTATIN CALCIUM 5 MG T: 5 | 90 days supply | Qty: 90 | Fill #2

## 2017-08-20 MED FILL — ZOLPIDEM TARTRATE 10 MG TAB: 10 | 30 days supply | Qty: 30 | Fill #3

## 2017-08-20 MED FILL — ALPRAZolam 0.25 MG TABS: 0.25 | 30 days supply | Qty: 30 | Fill #1

## 2017-08-21 MED FILL — PROPAFENONE HCL ER 225 MG C: 225 | 30 days supply | Qty: 60 | Fill #5

## 2017-08-24 ENCOUNTER — Telehealth: Payer: Self-pay | Admitting: Oncology

## 2017-08-24 NOTE — Telephone Encounter (Signed)
Returned call to patient requesting appointment from Feb 2019 to be rescheduled to 4/22/ per phone msg 4/1

## 2017-08-25 DIAGNOSIS — Z86010 Personal history of colonic polyps: Secondary | ICD-10-CM | POA: Diagnosis not present

## 2017-08-25 DIAGNOSIS — Z1211 Encounter for screening for malignant neoplasm of colon: Secondary | ICD-10-CM | POA: Diagnosis not present

## 2017-08-25 DIAGNOSIS — K5730 Diverticulosis of large intestine without perforation or abscess without bleeding: Secondary | ICD-10-CM | POA: Diagnosis not present

## 2017-09-08 MED FILL — METOPROLOL SUCCINATE ER 50: 50 | 30 days supply | Qty: 30 | Fill #5

## 2017-09-09 NOTE — Progress Notes (Signed)
Pamela Underwood  Telephone:([phone] Fax:([phone]) [phone]     ID: [name] DOB: 1956-01-23  MR#: [id number]  GUY#:[id number]  Patient Care Team: Audley Hose, MD as PCP - General (Internal Medicine) Jettie Booze, MD as Consulting Physician (Cardiology) Everlene Farrier, MD as Consulting Physician (Obstetrics and Gynecology) Malvern Kadlec, Virgie Dad, MD as Consulting Physician (Oncology) Serena Colonel, RN as [facility] Rolm Bookbinder, MD as Consulting Physician (General Surgery) Arloa Koh, MD as Consulting Physician (Radiation Oncology) OTHER MD: Rolm Bookbinder M.D., Arloa Koh M.D.  CHIEF COMPLAINT: Estrogen receptor positive breast cancer  CURRENT TREATMENT: tamoxifen   BREAST CANCER HISTORY: From the original intake note:  Pamela Underwood had routine screening mammography at [facility] 02/03/2014. This showed a focal asymmetry in the right breast and additional views on 02/06/2014 showed an irregular mass in the right breast, 11:00 position. Ultrasound the same day showed this to be 1.1 cm, with irregular margins, and hypoechoic. There was also an oval lymph nodes with focal cortical thickening in the right axilla. There was also, finally, a 1.1 cm oval mass with circumscribed margins in the right breast upper inner quadrant. Color flow imaging there showed no increase in vascularity.biopsy of the upper outer quadrant right breast mass showed invasive ductal carcinoma. The upper inner quadrant right breast lesion was a fibroadenoma versus a benign phyllodes tumor. The right axillary lymph node that was suspicious was negative for metastatic disease (results are taken from MRI dictation, as the pathology report is not in EPIC).  On 02/08/2014 the patient underwent bilateral breast MRIs. This showed, in the upper outer right breast an irregular enhancing mass measuring 1.4 cm. There were mildly prominent right axillary lymph  nodes and bilateral nonspecific internal mammary lymph nodes measuring up to 4 mm.  On 02/22/2014 the patient underwent right lumpectomy for the upper inner quadrant lesion which proved to be a fibroadenoma. On the same day right upper outer quadrant lumpectomy showed an invasive ductal carcinoma measuring 1.6 cm, in the setting of ductal carcinoma in situ. The invasive tumor was less than a millimeter from the anterior margin. Both sentinel lymph nodes were benign.  The patient's subsequent history is as detailed below  INTERVAL HISTORY: Pamela Underwood returns today for follow-up of her estrogen receptor positive breast cancer. She continues on tamoxifen, with good tolerance. Hot flashes are not a major issue for her. She denies issues with vaginal wetness.   Since her last visit, she underwent diagnostic bilateral mammography with CAD and tomography on 03/25/2017 at [facility] showing: breast density category B. There was no evidence of malignancy.    REVIEW OF SYSTEMS: Pamela Underwood reports that she went for her colonoscopy pre-screening last week. She is going to complete a lipid panel on 09/16/2017. She plans to have her colonoscopy in June under Dr. Collene Mares. She notes that she has been fatigued. She completed a thyroid panel which was abnormal. She was referred to Dr. [facility]. She started gaining weight 6 months ago even though she was consistently exercising at the gym. She does exercises on the treadmill, stationary bike, and elliptical . She also goes walking at least 3 times per week. She noticed that while working in the [employer], she breaks out into sweats during the day. A detailed review of systems was otherwise stable.     PAST MEDICAL HISTORY: Past Medical History:  Diagnosis Date  . Arthritis   . Atrial tachycardia ([place])   . Back pain  occasionally  . Breast cancer ([place]) 02/22/14   right upper inner  . Chronic anxiety    takes Xanax daily as needed when heart rate going up  . Colitis     hx-one  . Insomnia    takes Ambien nightly as needed  . Joint pain   . Joint swelling   . Overweight (BMI 25.0-29.9) 12/10/2016  . S/P radiation therapy 03/28/2014 through 04/27/2014                                03/28/2014 through 04/27/2014                                                                         Right breast 4250 cGy 17 sessions, right breast boost 1000 cGy in 4 sessions (hypofractionated)                         . Sleep apnea    uses a CPAP    PAST SURGICAL HISTORY: Past Surgical History:  Procedure Laterality Date  . BREAST BIOPSY Left 05/02/1999   benign  . BREAST LUMPECTOMY WITH AXILLARY LYMPH NODE BIOPSY Right 02/22/14   upper inner  . COLONOSCOPY    . CYSTOSCOPY    . KNEE ARTHROSCOPY WITH MEDIAL MENISECTOMY Left 03/31/2013   Procedure: LEFT KNEE ARTHROSCOPY WITH PARTIAL MEDIAL MENISECTOMY, CHONDROPLASTY OF PATELLA  FEMORAL JOINT, EXCISION OF MEDIAL PLICA, PARTIAL LATERAL MENISECTOMY;  Surgeon: [name], MD;  Location: [facility];  Service: Orthopedics;  Laterality: Left;  . TOTAL KNEE ARTHROPLASTY Left 06/29/2013   Procedure: TOTAL KNEE ARTHROPLASTY;  Surgeon: [name], MD;  Location: [facility];  Service: Orthopedics;  Laterality: Left;  . TOTAL KNEE ARTHROPLASTY Right 03/12/2016  . TOTAL KNEE ARTHROPLASTY Right 03/12/2016   Procedure: RIGHT TOTAL KNEE ARTHROPLASTY;  Surgeon: [name], MD;  Location: [facility];  Service: Orthopedics;  Laterality: Right;  . TUBAL LIGATION      FAMILY HISTORY Family History  Problem Relation Age of Onset  . Heart disease Father   . Hypertension Father   . Diabetes Father   . Heart attack Neg Hx    the patient's father died from a myocardial infarction in his 71s. The patient's mother also died in her 13s, after her an accidental fire exposure. The patient had one sister, no brothers. There is no history of breast or ovarian cancer in the family  GYNECOLOGIC HISTORY:  No LMP recorded. Patient is  postmenopausal. Menarche age 16, first live birth age 31, the patient is GX P3. She stopped having periods at the age of 65. She did not take hormone replacement.  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 Her husband Ronalee Belts is vice Radio producer for a [employer]. Daughter Vida Roller works as a Marine scientist in [place]. Daughter Florentina Jenny teaches kindergarten in [place]. Son Selinda Flavin is Facilities manager in [place]. The patient has no grandchildren. She attends a [employer]    ADVANCED DIRECTIVES: Not in place   HEALTH MAINTENANCE: Social History   Tobacco Use  . Smoking status: Never Smoker  . Smokeless  tobacco: Never Used  Substance Use Topics  . Alcohol use: Yes    Comment: socailly  . Drug use: No     Colonoscopy:  PAP:  Bone density:[facility] 05/15/2014 -- normal  Lipid panel:  Allergies  Allergen Reactions  . Escitalopram Oxalate Other (See Comments)    Hives, welts, all over skin rash    Current Outpatient Medications  Medication Sig Dispense Refill  . ALPRAZolam (XANAX) 0.25 MG tablet Take 0.25 mg by mouth daily as needed for anxiety.   4  . aspirin 81 MG tablet Take 81 mg by mouth daily.    . metoprolol succinate (TOPROL-XL) 50 MG 24 hr tablet TAKE 1 TABLET BY MOUTH DAILY WITH OR IMMEDIATELY FOLLOWING A MEAL 30 tablet 11  . propafenone (RYTHMOL SR) 225 MG 12 hr capsule TAKE 1 CAPSULE BY MOUTH EVERY 12 HOURS 60 capsule 11  . rosuvastatin (CRESTOR) 5 MG tablet TAKE 1 TABLET BY MOUTH DAILY. 90 tablet 2  . tamoxifen (NOLVADEX) 20 MG tablet TAKE 1 TABLET (20 MG TOTAL) BY MOUTH DAILY. 90 tablet 3  . VITAMIN D, CHOLECALCIFEROL, PO Take 1,000 Units by mouth daily.     Marland Kitchen zolpidem (AMBIEN) 10 MG tablet Take 10 mg by mouth at bedtime as needed for sleep.      No current facility-administered medications for this visit.     OBJECTIVE: Middle-aged white woman who appears well Vitals:   09/14/17 1415  BP: 129/87  Pulse: 73  Resp: 20  Temp: 98.5 F  (36.9 C)  SpO2: 100%     Body mass index is 31.83 kg/m.    ECOG FS:1 - Symptomatic but completely ambulatory  Sclerae unicteric, EOMs intact Oropharynx clear and moist No cervical or supraclavicular adenopathy Lungs no rales or rhonchi Heart regular rate and rhythm Abd soft, nontender, positive bowel sounds MSK no focal spinal tenderness, no upper extremity lymphedema Neuro: nonfocal, well oriented, appropriate affect Breasts: On the right the breast is status post lumpectomy followed by radiation.  There is no evidence of local recurrence.  The left breast is benign.  Both axillae are benign.  LAB RESULTS:  She had preop labs drawn last week and these were not repeated here.  This showed a completely normal CBC and c-Met.  CMP     Component Value Date/Time   NA 137 03/14/2016 0613   NA 140 06/06/2015 1556   K 3.9 03/14/2016 0613   K 4.4 06/06/2015 1556   CL 106 03/14/2016 0613   CO2 26 03/14/2016 0613   CO2 24 06/06/2015 1556   GLUCOSE 121 (H) 03/14/2016 0613   GLUCOSE 105 06/06/2015 1556   BUN 14 03/14/2016 0613   BUN 12.7 06/06/2015 1556   CREATININE 0.64 03/14/2016 0613   CREATININE 0.9 06/06/2015 1556   CALCIUM 8.2 (L) 03/14/2016 0613   CALCIUM 8.9 06/06/2015 1556   PROT 6.7 02/29/2016 1030   PROT 6.9 06/06/2015 1556   ALBUMIN 4.2 02/29/2016 1030   ALBUMIN 4.0 06/06/2015 1556   AST 23 02/29/2016 1030   AST 20 06/06/2015 1556   ALT 20 02/29/2016 1030   ALT 21 06/06/2015 1556   ALKPHOS 53 02/29/2016 1030   ALKPHOS 64 06/06/2015 1556   BILITOT 0.2 (L) 02/29/2016 1030   BILITOT 0.47 06/06/2015 1556   GFRNONAA >60 03/14/2016 0613   GFRAA >60 03/14/2016 0613    I No results found for: SPEP  Lab Results  Component Value Date   WBC 12.7 (H) 03/14/2016   NEUTROABS 3.5  02/29/2016   HGB 10.6 (L) 03/14/2016   HCT 31.8 (L) 03/14/2016   MCV 95.2 03/14/2016   PLT 146 (L) 03/14/2016      Chemistry      Component Value Date/Time   NA 137 03/14/2016 0613   NA  140 06/06/2015 1556   K 3.9 03/14/2016 0613   K 4.4 06/06/2015 1556   CL 106 03/14/2016 0613   CO2 26 03/14/2016 0613   CO2 24 06/06/2015 1556   BUN 14 03/14/2016 0613   BUN 12.7 06/06/2015 1556   CREATININE 0.64 03/14/2016 0613   CREATININE 0.9 06/06/2015 1556      Component Value Date/Time   CALCIUM 8.2 (L) 03/14/2016 0613   CALCIUM 8.9 06/06/2015 1556   ALKPHOS 53 02/29/2016 1030   ALKPHOS 64 06/06/2015 1556   AST 23 02/29/2016 1030   AST 20 06/06/2015 1556   ALT 20 02/29/2016 1030   ALT 21 06/06/2015 1556   BILITOT 0.2 (L) 02/29/2016 1030   BILITOT 0.47 06/06/2015 1556       No results found for: LABCA2  No components found for: LABCA125  No results for input(s): INR in the last 168 hours.  Urinalysis    Component Value Date/Time   COLORURINE YELLOW 02/29/2016 1035   APPEARANCEUR CLEAR 02/29/2016 1035   LABSPEC 1.003 (L) 02/29/2016 1035   PHURINE 6.0 02/29/2016 1035   GLUCOSEU NEGATIVE 02/29/2016 1035   HGBUR SMALL (A) 02/29/2016 1035   BILIRUBINUR NEGATIVE 02/29/2016 1035   KETONESUR NEGATIVE 02/29/2016 1035   PROTEINUR NEGATIVE 02/29/2016 1035   UROBILINOGEN 0.2 06/27/2013 1401   NITRITE NEGATIVE 02/29/2016 1035   LEUKOCYTESUR NEGATIVE 02/29/2016 1035    STUDIES: Since her last visit, she underwent diagnostic bilateral mammography with CAD and tomography on 03/25/2017 at [facility] showing: breast density category B. There was no evidence of malignancy.   ASSESSMENT: 62 y.o. [place] woman status post right lumpectomy and sentinel lymph node sampling 02/22/2014 for a pT1c pN0, stage IA invasive ductal carcinoma, grade 1 estrogen receptor 100% positive, progesterone receptor 85% positive, with no HER-2 amplification and an MIB-1 of 12%  (1) OncotypeDX 02/28/2014 score of 8 predicts an outside-the-breast recurrence of 6% if the patient's only systemic treatment is tamoxifen for 5 years. It also predicts no benefit from chemotherapy  (2) completed adjuvant  radiation 04/27/2014: 4250 cGy to the right breast with a 1000 cGy boost to the scar  (3) started tamoxifen January 2015    PLAN: Taleigh is now 3-1/2 years out from definitive surgery for breast cancer with no evidence of disease recurrence.  This is very favorable.  She is tolerating tamoxifen well.  The plan is to continue that for a minimum of 5 years.  I am hopeful her TSH correction will lead to the weight loss she hopes for about a TSH between 5 and 10 is generally not that symptomatic and it could be that this is all part of menopause.  At any rate she will see me again in 1 year.  She knows to call for any issues that may develop before the next visit.  Janiel Derhammer, Virgie Dad, MD  09/14/17 2:36 PM Medical Oncology and Hematology [facility] [address] [place], [place] [address] Tel. [phone]    Fax. [phone]  This document serves as a record of services personally performed by Lurline Del, MD. It was created on his behalf by Sheron Nightingale, a trained medical scribe. The creation of this record is based on the  scribe's personal observations and the provider's statements to them.   I have reviewed the above documentation for accuracy and completeness, and I agree with the above.

## 2017-09-10 DIAGNOSIS — G4733 Obstructive sleep apnea (adult) (pediatric): Secondary | ICD-10-CM | POA: Diagnosis not present

## 2017-09-14 ENCOUNTER — Inpatient Hospital Stay: Payer: 59

## 2017-09-14 ENCOUNTER — Inpatient Hospital Stay: Payer: 59 | Attending: Oncology | Admitting: Oncology

## 2017-09-14 ENCOUNTER — Other Ambulatory Visit: Payer: Self-pay | Admitting: Oncology

## 2017-09-14 VITALS — BP 129/87 | HR 73 | Temp 98.5°F | Resp 20 | Ht 67.0 in | Wt 203.2 lb

## 2017-09-14 DIAGNOSIS — Z170 Estrogen receptor positive status [ER+]: Secondary | ICD-10-CM | POA: Insufficient documentation

## 2017-09-14 DIAGNOSIS — C50211 Malignant neoplasm of upper-inner quadrant of right female breast: Secondary | ICD-10-CM

## 2017-09-14 DIAGNOSIS — Z79810 Long term (current) use of selective estrogen receptor modulators (SERMs): Secondary | ICD-10-CM | POA: Insufficient documentation

## 2017-09-14 DIAGNOSIS — Z6831 Body mass index (BMI) 31.0-31.9, adult: Secondary | ICD-10-CM | POA: Diagnosis not present

## 2017-09-14 DIAGNOSIS — C50411 Malignant neoplasm of upper-outer quadrant of right female breast: Secondary | ICD-10-CM | POA: Insufficient documentation

## 2017-09-14 DIAGNOSIS — E782 Mixed hyperlipidemia: Secondary | ICD-10-CM | POA: Diagnosis not present

## 2017-09-14 DIAGNOSIS — E039 Hypothyroidism, unspecified: Secondary | ICD-10-CM | POA: Diagnosis not present

## 2017-09-14 MED ORDER — TAMOXIFEN CITRATE 20 MG PO TABS: 20.0000 mg | ORAL_TABLET | Freq: Every day | ORAL | 3 refills | Status: DC

## 2017-09-14 MED FILL — PROPAFENONE HCL ER 225 MG C: 225 | 30 days supply | Qty: 60 | Fill #6

## 2017-09-16 DIAGNOSIS — E039 Hypothyroidism, unspecified: Secondary | ICD-10-CM | POA: Diagnosis not present

## 2017-09-16 DIAGNOSIS — E782 Mixed hyperlipidemia: Secondary | ICD-10-CM | POA: Diagnosis not present

## 2017-09-16 MED FILL — ALPRAZolam 0.25 MG TABS: 0.25 | 30 days supply | Qty: 30 | Fill #2

## 2017-09-16 MED FILL — ZOLPIDEM TARTRATE 10 MG TAB: 10 | 30 days supply | Qty: 30 | Fill #0

## 2017-09-17 MED FILL — LEVOTHYROXINE 50 MCG TABLET: 50 | 30 days supply | Qty: 30 | Fill #0

## 2017-09-30 ENCOUNTER — Ambulatory Visit: Payer: 59 | Admitting: Endocrinology

## 2017-10-07 MED FILL — METOPROLOL SUCCINATE ER 50: 50 | 30 days supply | Qty: 30 | Fill #6

## 2017-10-15 MED FILL — ZOLPIDEM TARTRATE 10 MG TAB: 10 | 30 days supply | Qty: 30 | Fill #1

## 2017-10-22 MED FILL — TAMOXIFEN CITRATE 20 MG TAB: 20 | 90 days supply | Qty: 90 | Fill #1

## 2017-10-22 MED FILL — PROPAFENONE HCL ER 225 MG C: 225 | 30 days supply | Qty: 60 | Fill #7

## 2017-10-26 MED FILL — LEVOTHYROXINE 50 MCG TABLET: 50 | 30 days supply | Qty: 30 | Fill #0

## 2017-11-03 DIAGNOSIS — E039 Hypothyroidism, unspecified: Secondary | ICD-10-CM | POA: Diagnosis not present

## 2017-11-04 MED FILL — ALPRAZolam 0.25 MG TABS: 0.25 | 30 days supply | Qty: 30 | Fill #0

## 2017-11-04 MED FILL — GAVILYTE-G SOLUTION: 236 | 1 days supply | Qty: 4000 | Fill #0

## 2017-11-06 DIAGNOSIS — K633 Ulcer of intestine: Secondary | ICD-10-CM | POA: Diagnosis not present

## 2017-11-06 DIAGNOSIS — Z1211 Encounter for screening for malignant neoplasm of colon: Secondary | ICD-10-CM | POA: Diagnosis not present

## 2017-11-06 DIAGNOSIS — K6389 Other specified diseases of intestine: Secondary | ICD-10-CM | POA: Diagnosis not present

## 2017-11-09 MED FILL — METOPROLOL SUCCINATE ER 50: 50 | 30 days supply | Qty: 30 | Fill #7

## 2017-11-13 MED FILL — ZOLPIDEM TARTRATE 10 MG TAB: 10 | 30 days supply | Qty: 30 | Fill #2

## 2017-11-23 MED FILL — PROPAFENONE HCL ER 225 MG C: 225 | 30 days supply | Qty: 60 | Fill #8

## 2017-11-25 MED FILL — LEVOTHYROXINE 50 MCG TABLET: 50 | 30 days supply | Qty: 30 | Fill #1

## 2017-12-08 MED FILL — ALPRAZolam 0.25 MG TABS: 0.25 | 30 days supply | Qty: 30 | Fill #0

## 2017-12-11 MED FILL — METOPROLOL SUCCINATE ER 50: 50 | 30 days supply | Qty: 30 | Fill #8

## 2017-12-11 MED FILL — ZOLPIDEM TARTRATE 10 MG TAB: 10 | 30 days supply | Qty: 30 | Fill #3

## 2017-12-25 MED FILL — PROPAFENONE HCL ER 225 MG C: 225 | 30 days supply | Qty: 60 | Fill #9

## 2017-12-31 ENCOUNTER — Telehealth: Payer: Self-pay | Admitting: *Deleted

## 2017-12-31 DIAGNOSIS — G4733 Obstructive sleep apnea (adult) (pediatric): Secondary | ICD-10-CM | POA: Diagnosis not present

## 2017-12-31 NOTE — Telephone Encounter (Signed)
-----   Message from Sueanne Margarita, MD sent at 12/25/2017  4:45 PM EDT ----- Good AHI on PAP but needs to improve compliance

## 2017-12-31 NOTE — Telephone Encounter (Signed)
Informed patient of compliance results and verbalized understanding was indicated. Patient is aware and agreeable to AHI being within range at 0.9. Patient is aware and agreeable to improving in compliance with machine usage.

## 2018-01-01 MED FILL — LEVOTHYROXINE 50 MCG TABLET: 50 | 30 days supply | Qty: 30 | Fill #2

## 2018-01-11 MED FILL — ALPRAZolam 0.25 MG TABS: 0.25 | 30 days supply | Qty: 30 | Fill #1

## 2018-01-11 MED FILL — ZOLPIDEM TARTRATE 10 MG TAB: 10 | 30 days supply | Qty: 30 | Fill #0

## 2018-01-14 ENCOUNTER — Other Ambulatory Visit: Payer: Self-pay | Admitting: Oncology

## 2018-01-14 MED FILL — ROSUVASTATIN CALCIUM 5 MG T: 5 | 90 days supply | Qty: 90 | Fill #0

## 2018-01-14 MED FILL — METOPROLOL SUCCINATE ER 50: 50 | 30 days supply | Qty: 30 | Fill #9

## 2018-01-26 MED FILL — PROPAFENONE HCL ER 225 MG C: 225 | 30 days supply | Qty: 60 | Fill #10

## 2018-01-26 MED FILL — LEVOTHYROXINE 50 MCG TABLET: 50 | 30 days supply | Qty: 30 | Fill #3

## 2018-01-26 MED FILL — TAMOXIFEN CITRATE 20 MG TAB: 20 | 90 days supply | Qty: 90 | Fill #2

## 2018-02-05 DIAGNOSIS — Z6831 Body mass index (BMI) 31.0-31.9, adult: Secondary | ICD-10-CM | POA: Diagnosis not present

## 2018-02-05 DIAGNOSIS — E782 Mixed hyperlipidemia: Secondary | ICD-10-CM | POA: Diagnosis not present

## 2018-02-05 DIAGNOSIS — G4733 Obstructive sleep apnea (adult) (pediatric): Secondary | ICD-10-CM | POA: Diagnosis not present

## 2018-02-05 DIAGNOSIS — E039 Hypothyroidism, unspecified: Secondary | ICD-10-CM | POA: Diagnosis not present

## 2018-02-08 DIAGNOSIS — Z96651 Presence of right artificial knee joint: Secondary | ICD-10-CM | POA: Diagnosis not present

## 2018-02-08 DIAGNOSIS — G4733 Obstructive sleep apnea (adult) (pediatric): Secondary | ICD-10-CM | POA: Diagnosis not present

## 2018-02-10 MED FILL — ZOLPIDEM TARTRATE 10 MG TAB: 10 | 30 days supply | Qty: 30 | Fill #0

## 2018-02-16 MED FILL — METOPROLOL SUCCINATE ER 50: 50 | 30 days supply | Qty: 30 | Fill #10

## 2018-02-16 MED FILL — ALPRAZolam 0.25 MG TABS: 0.25 | 30 days supply | Qty: 30 | Fill #2

## 2018-02-22 MED FILL — AMOXICILLIN 500 MG CAPSULE: 500 | 1 days supply | Qty: 4 | Fill #0

## 2018-02-26 MED FILL — PROPAFENONE HCL ER 225 MG C: 225 | 30 days supply | Qty: 60 | Fill #11

## 2018-03-10 DIAGNOSIS — Z96651 Presence of right artificial knee joint: Secondary | ICD-10-CM | POA: Diagnosis not present

## 2018-03-10 DIAGNOSIS — G4733 Obstructive sleep apnea (adult) (pediatric): Secondary | ICD-10-CM | POA: Diagnosis not present

## 2018-03-15 MED FILL — ZOLPIDEM TARTRATE 10 MG TAB: 10 | 30 days supply | Qty: 30 | Fill #0

## 2018-03-16 MED FILL — METOPROLOL SUCCINATE ER 50: 50 | 30 days supply | Qty: 30 | Fill #11

## 2018-03-24 DIAGNOSIS — Z01419 Encounter for gynecological examination (general) (routine) without abnormal findings: Secondary | ICD-10-CM | POA: Diagnosis not present

## 2018-03-24 DIAGNOSIS — Z6832 Body mass index (BMI) 32.0-32.9, adult: Secondary | ICD-10-CM | POA: Diagnosis not present

## 2018-03-25 MED FILL — LEVOTHYROXINE 50 MCG TABLET: 50 | 30 days supply | Qty: 30 | Fill #0

## 2018-03-29 ENCOUNTER — Telehealth: Payer: Self-pay | Admitting: Interventional Cardiology

## 2018-03-29 MED ORDER — PROPAFENONE HCL ER 225 MG PO CP12: ORAL_CAPSULE | ORAL | 2 refills | Status: DC

## 2018-03-29 MED FILL — PROPAFENONE HCL ER 225 MG C: 225 | 30 days supply | Qty: 60 | Fill #0

## 2018-03-29 NOTE — Telephone Encounter (Signed)
Pt's medication was sent to pt's pharmacy as requested. Confirmation received.  °

## 2018-03-29 NOTE — Telephone Encounter (Signed)
New Message           *STAT* If patient is at the pharmacy, call can be transferred to refill team.   1. Which medications need to be refilled? (please list name of each medication and dose if known) propafenone (RYTHMOL SR) 225 MG 12 hr capsule  2. Which pharmacy/location (including street and city if local pharmacy) is medication to be sent to? [facility]  3. Do they need a 30 day or 90 day supply? 90      Patient took last pill this AM

## 2018-04-06 ENCOUNTER — Other Ambulatory Visit: Payer: Self-pay | Admitting: Cardiology

## 2018-04-06 MED FILL — ALPRAZolam 0.25 MG TABS: 0.25 | 30 days supply | Qty: 30 | Fill #0

## 2018-04-07 ENCOUNTER — Encounter: Payer: Self-pay | Admitting: Oncology

## 2018-04-07 DIAGNOSIS — R922 Inconclusive mammogram: Secondary | ICD-10-CM | POA: Diagnosis not present

## 2018-04-07 DIAGNOSIS — Z853 Personal history of malignant neoplasm of breast: Secondary | ICD-10-CM | POA: Diagnosis not present

## 2018-04-09 MED FILL — METOPROLOL SUCCINATE ER 50: 50 | 90 days supply | Qty: 90 | Fill #0

## 2018-04-10 DIAGNOSIS — G4733 Obstructive sleep apnea (adult) (pediatric): Secondary | ICD-10-CM | POA: Diagnosis not present

## 2018-04-10 DIAGNOSIS — Z96651 Presence of right artificial knee joint: Secondary | ICD-10-CM | POA: Diagnosis not present

## 2018-04-15 MED FILL — ZOLPIDEM TARTRATE 10 MG TAB: 10 | 30 days supply | Qty: 30 | Fill #0

## 2018-04-27 MED FILL — PROPAFENONE HCL ER 225 MG C: 225 | 30 days supply | Qty: 60 | Fill #1

## 2018-04-27 MED FILL — TAMOXIFEN CITRATE 20 MG TAB: 20 | 90 days supply | Qty: 90 | Fill #3

## 2018-05-06 MED FILL — ALPRAZolam 0.25 MG TABS: 0.25 | 30 days supply | Qty: 30 | Fill #1

## 2018-05-10 DIAGNOSIS — Z96651 Presence of right artificial knee joint: Secondary | ICD-10-CM | POA: Diagnosis not present

## 2018-05-10 DIAGNOSIS — G4733 Obstructive sleep apnea (adult) (pediatric): Secondary | ICD-10-CM | POA: Diagnosis not present

## 2018-05-11 MED FILL — LEVOTHYROXINE 50 MCG TABLET: 50 | 30 days supply | Qty: 30 | Fill #0

## 2018-05-14 MED FILL — ZOLPIDEM TARTRATE 10 MG TAB: 10 | 30 days supply | Qty: 30 | Fill #1

## 2018-05-18 DIAGNOSIS — G4733 Obstructive sleep apnea (adult) (pediatric): Secondary | ICD-10-CM | POA: Diagnosis not present

## 2018-05-28 MED FILL — PROPAFENONE HCL ER 225 MG C: 225 | 30 days supply | Qty: 60 | Fill #2

## 2018-06-10 DIAGNOSIS — G4733 Obstructive sleep apnea (adult) (pediatric): Secondary | ICD-10-CM | POA: Diagnosis not present

## 2018-06-10 DIAGNOSIS — Z96651 Presence of right artificial knee joint: Secondary | ICD-10-CM | POA: Diagnosis not present

## 2018-06-14 MED FILL — ROSUVASTATIN CALCIUM 5 MG T: 5 | 90 days supply | Qty: 90 | Fill #1

## 2018-06-14 MED FILL — ALPRAZolam 0.25 MG TABS: 0.25 | 30 days supply | Qty: 30 | Fill #2

## 2018-06-14 MED FILL — LEVOTHYROXINE 50 MCG TABLET: 50 | 30 days supply | Qty: 30 | Fill #1

## 2018-06-14 MED FILL — ZOLPIDEM TARTRATE 10 MG TAB: 10 | 30 days supply | Qty: 30 | Fill #2

## 2018-06-24 ENCOUNTER — Ambulatory Visit: Payer: 59 | Admitting: Interventional Cardiology

## 2018-06-29 ENCOUNTER — Other Ambulatory Visit: Payer: Self-pay | Admitting: Interventional Cardiology

## 2018-06-29 MED FILL — PROPAFENONE HCL ER 225 MG C: 225 | 30 days supply | Qty: 60 | Fill #0

## 2018-07-07 MED FILL — CELECOXIB 200 MG CAP: 200 | 30 days supply | Qty: 30 | Fill #0

## 2018-07-12 ENCOUNTER — Encounter: Payer: Self-pay | Admitting: Physician Assistant

## 2018-07-13 ENCOUNTER — Ambulatory Visit: Payer: 59 | Admitting: Cardiology

## 2018-07-13 ENCOUNTER — Encounter: Payer: Self-pay | Admitting: Cardiology

## 2018-07-13 VITALS — BP 138/90 | HR 67 | Ht 67.0 in | Wt 203.8 lb

## 2018-07-13 DIAGNOSIS — G4733 Obstructive sleep apnea (adult) (pediatric): Secondary | ICD-10-CM | POA: Diagnosis not present

## 2018-07-13 DIAGNOSIS — Z79899 Other long term (current) drug therapy: Secondary | ICD-10-CM

## 2018-07-13 DIAGNOSIS — I471 Supraventricular tachycardia: Secondary | ICD-10-CM | POA: Diagnosis not present

## 2018-07-13 NOTE — Patient Instructions (Signed)
Medication Instructions:  Your physician recommends that you continue on your current medications as directed. Please refer to the Current Medication list given to you today.  If you need a refill on your cardiac medications before your next appointment, please call your pharmacy.   Lab work: TODAY:  BMET & MAG  If you have labs (blood work) drawn today and your tests are completely normal, you will receive your results only by: Marland Kitchen MyChart Message (if you have MyChart) OR . A paper copy in the mail If you have any lab test that is abnormal or we need to change your treatment, we will call you to review the results.  Testing/Procedures: None ordered  Follow-Up: At [facility], you and your health needs are our priority.  As part of our continuing mission to provide you with exceptional heart care, we have created designated Provider Care Teams.  These Care Teams include your primary Cardiologist (physician) and Advanced Practice Providers (APPs -  Physician Assistants and Nurse Practitioners) who all work together to provide you with the care you need, when you need it. You will need a follow up appointment in 12 months.  Please call our office 2 months in advance to schedule this appointment.  You may see Larae Grooms, MD or one of the following Advanced Practice Providers on your designated Care Team:   [place], PA-C Melina Copa, PA-C . Ermalinda Barrios, PA-C  Any Other Special Instructions Will Be Listed Below (If Applicable).

## 2018-07-13 NOTE — Progress Notes (Signed)
Cardiology Office Note   Date:  07/13/2018   ID:  Janya, Eveland 1955/06/14, MRN [id number]  PCP:  Audley Hose, MD  Cardiologist: Dr. Larae Grooms, MD  Chief Complaint: SVT follow up   History of Present Illness: Pamela Underwood is a 63 y.o. female who presents for follow up of SVT, seen for Dr. Irish Lack.   Pamela Underwood has a prior hx of PSVT (on propafenone and Toprol and has declined ablation in the past), breast cancer s/p surgery and radiation (2015), HLD, sleep apnea on CPAP and obesity.   She was last seen by Dr. Irish Lack on 03/18/2017 in follow-up and was doing well at that point with no concerns.   Today she presents for annual follow up and is doing well. She monitors her HR with her fit bit and states that on occasion she will notice that it will sustain in the 110's but states that she has been under more stress with her mother in law now living with her. She states that she has had no recurrence of SVT or no sensation of palpations or dizziness. She recently gained about 10lbs over the course of two months and had her thyroid check in which she was diagnosed with hypothyroidism. She is now on Synthroid.   Past Medical History:  Diagnosis Date  . Arthritis   . Atrial tachycardia ([place])   . Back pain    occasionally  . Breast cancer ([place]) 02/22/14   right upper inner  . Chronic anxiety    takes Xanax daily as needed when heart rate going up  . Colitis    hx-one  . Hyperlipidemia   . Insomnia    takes Ambien nightly as needed  . Overweight (BMI 25.0-29.9) 12/10/2016  . PSVT (paroxysmal supraventricular tachycardia) ([place])   . S/P radiation therapy 03/28/2014 through 04/27/2014                                03/28/2014 through 04/27/2014                                                                         Right breast 4250 cGy 17 sessions, right breast boost 1000 cGy in 4 sessions (hypofractionated)                         . Sleep apnea    uses  a CPAP    Past Surgical History:  Procedure Laterality Date  . BREAST BIOPSY Left 05/02/1999   benign  . BREAST LUMPECTOMY WITH AXILLARY LYMPH NODE BIOPSY Right 02/22/14   upper inner  . COLONOSCOPY    . CYSTOSCOPY    . KNEE ARTHROSCOPY WITH MEDIAL MENISECTOMY Left 03/31/2013   Procedure: LEFT KNEE ARTHROSCOPY WITH PARTIAL MEDIAL MENISECTOMY, CHONDROPLASTY OF PATELLA  FEMORAL JOINT, EXCISION OF MEDIAL PLICA, PARTIAL LATERAL MENISECTOMY;  Surgeon: [name], MD;  Location: [facility];  Service: Orthopedics;  Laterality: Left;  . TOTAL KNEE ARTHROPLASTY Left 06/29/2013   Procedure: TOTAL KNEE ARTHROPLASTY;  Surgeon: [name], MD;  Location: [facility];  Service: Orthopedics;  Laterality: Left;  .  TOTAL KNEE ARTHROPLASTY Right 03/12/2016  . TOTAL KNEE ARTHROPLASTY Right 03/12/2016   Procedure: RIGHT TOTAL KNEE ARTHROPLASTY;  Surgeon: [name], MD;  Location: [facility];  Service: Orthopedics;  Laterality: Right;  . TUBAL LIGATION       Current Outpatient Medications  Medication Sig Dispense Refill  . ALPRAZolam (XANAX) 0.25 MG tablet Take 0.25 mg by mouth daily as needed for anxiety.   4  . amoxicillin (AMOXIL) 500 MG capsule Take 4 capsules by mouth as directed. Prior to dental procedures    . aspirin 81 MG tablet Take 81 mg by mouth daily.    . celecoxib (CELEBREX) 200 MG capsule Take 200 mg by mouth daily.    Marland Kitchen levothyroxine (SYNTHROID, LEVOTHROID) 50 MCG tablet Take 50 mcg by mouth daily.    . metoprolol succinate (TOPROL-XL) 50 MG 24 hr tablet TAKE 1 TABLET BY MOUTH DAILY WITH OR IMMEDIATELY FOLLOWING A MEAL 30 tablet 2  . propafenone (RYTHMOL SR) 225 MG 12 hr capsule TAKE 1 CAPSULE BY MOUTH EVERY 12 HOURS 60 capsule 0  . rosuvastatin (CRESTOR) 5 MG tablet TAKE 1 TABLET BY MOUTH DAILY. 90 tablet 2  . tamoxifen (NOLVADEX) 20 MG tablet Take 1 tablet (20 mg total) by mouth daily. 90 tablet 3  . VITAMIN D, CHOLECALCIFEROL, PO Take 1,000 Units by mouth daily.     Marland Kitchen  zolpidem (AMBIEN) 10 MG tablet Take 10 mg by mouth at bedtime as needed for sleep.      No current facility-administered medications for this visit.     Allergies:   Escitalopram oxalate and Cardizem  [diltiazem hcl]    Social History:  The patient  reports that she has never smoked. She has never used smokeless tobacco. She reports current alcohol use. She reports that she does not use drugs.   Family History:  The patient's family history includes Diabetes in her father; Heart disease in her father; Hypertension in her father.    ROS:  Please see the history of present illness. Otherwise, review of systems are positive for none. All other systems are reviewed and negative.    PHYSICAL EXAM: VS:  BP 138/90   Pulse 67   Ht 5\' 7"  (1.702 m)   Wt 203 lb 12.8 oz (92.4 kg)   SpO2 97%   BMI 31.92 kg/m  , BMI Body mass index is 31.92 kg/m.   General: Well developed, well nourished, NAD Skin: Warm, dry, intact  Head: Normocephalic, atraumatic, sclera non-icteric, no xanthomas, clear, moist mucus membranes. Neck: Negative for carotid bruits. No JVD Lungs:Clear to ausculation bilaterally. No wheezes, rales, or rhonchi. Breathing is unlabored. Cardiovascular: RRR with S1 S2. No murmurs, rubs, gallops, or LV heave appreciated. MSK: Strength and tone appear normal for age. 5/5 in all extremities Extremities: No edema. No clubbing or cyanosis. DP/PT pulses 2+ bilaterally Neuro: Alert and oriented. No focal deficits. No facial asymmetry. MAE spontaneously. Psych: Responds to questions appropriately with normal affect.     EKG:  EKG is ordered today. The ekg ordered today demonstrates NSR   Recent Labs: No results found for requested labs within last 8760 hours.    Lipid Panel    Component Value Date/Time   CHOL 254 (H) 06/12/2014 0837   TRIG 84 06/12/2014 0837   HDL 84 06/12/2014 0837   CHOLHDL 3.0 06/12/2014 0837   VLDL 17 06/12/2014 0837   LDLCALC 153 (H) 06/12/2014 0837      Wt Readings from Last 3 Encounters:  07/13/18 203 lb 12.8 oz (92.4 kg)  09/14/17 203 lb 3.2 oz (92.2 kg)  03/18/17 191 lb 6.4 oz (86.8 kg)     ASSESSMENT AND PLAN:  1. Hx of SVT: -EKG today, NSR with HR 67 -Qtc, 452 -Has been stable on Rhythmol and Toprol -Will check BMET and Mg+ today  -Will have her monitor HR closely, if continues to sustain intermittently in the low 100's con consider increasing Toprol further -BP stable  -Denies palpations or dizziness   2. HLD: -Last LDL from 2016, 153 -Was seen by PCP 02/2018 with stable LDL per patient  -Continue statin and follow with PCP for lipid panel   3. Borderline hypertension: -BP stable, 138/90 -Continue Toprol  -See plan for #1   Current medicines are reviewed at length with the patient today.  The patient does not have concerns regarding medicines.  The following changes have been made:  no change  Labs/ tests ordered today include: BMET, CBC  Orders Placed This Encounter  Procedures  . Basic metabolic panel  . Magnesium  . EKG 12-Lead    Disposition:   FU with Dr. Irish Lack  in 1 year    Signed, Pamela Drown, NP  07/13/2018 [facility] [place], [place], [place]  [address] Phone: [phone]; Fax: [phone]

## 2018-07-14 ENCOUNTER — Telehealth: Payer: Self-pay | Admitting: Cardiology

## 2018-07-14 LAB — BASIC METABOLIC PANEL
BUN/Creatinine Ratio: 24 (ref 12–28)
BUN: 19 mg/dL (ref 8–27)
CO2: 23 mmol/L (ref 20–29)
CREATININE: 0.78 mg/dL (ref 0.57–1.00)
Calcium: 8.5 mg/dL — ABNORMAL LOW (ref 8.7–10.3)
Chloride: 104 mmol/L (ref 96–106)
GFR calc Af Amer: 95 mL/min/{1.73_m2} (ref >59)
GFR, EST NON AFRICAN AMERICAN: 82 mL/min/{1.73_m2} (ref >59)
Glucose: 91 mg/dL (ref 65–99)
Potassium: 4.5 mmol/L (ref 3.5–5.2)
Sodium: 141 mmol/L (ref 134–144)

## 2018-07-14 LAB — MAGNESIUM: Magnesium: 1.9 mg/dL (ref 1.6–2.3)

## 2018-07-14 MED FILL — ALPRAZolam 0.25 MG TABS: 0.25 | 30 days supply | Qty: 30 | Fill #3

## 2018-07-14 MED FILL — ZOLPIDEM TARTRATE 10 MG TAB: 10 | 30 days supply | Qty: 30 | Fill #3

## 2018-07-14 MED FILL — LEVOTHYROXINE 50 MCG TABLET: 50 | 30 days supply | Qty: 30 | Fill #2

## 2018-07-14 NOTE — Telephone Encounter (Signed)
New Message   Patient returning Pamela Underwood phone call about lab results.

## 2018-07-14 NOTE — Telephone Encounter (Signed)
Pt made aware of lab results per Kathyrn Drown NP. Pt verbalized understanding.

## 2018-07-14 NOTE — Telephone Encounter (Signed)
-----   Message from [name], NP sent at 07/14/2018  1:24 PM EST ----- Please let the patient know that her labs are within normal limits. Her calcium is a little low at 8.5 but appears to be at her baseline. Her magnesium is within normal limits at 1.9.   Thank you  Sharee Pimple

## 2018-07-19 ENCOUNTER — Other Ambulatory Visit: Payer: Self-pay | Admitting: Interventional Cardiology

## 2018-07-19 ENCOUNTER — Other Ambulatory Visit: Payer: Self-pay | Admitting: Cardiology

## 2018-07-19 MED ORDER — METOPROLOL SUCCINATE ER 50 MG PO TB24: ORAL_TABLET | ORAL | 3 refills | Status: DC

## 2018-07-19 MED FILL — METOPROLOL SUCCINATE ER 50: 50 | 90 days supply | Qty: 90 | Fill #0

## 2018-07-19 NOTE — Telephone Encounter (Signed)
Pt's medication was sent to pt's pharmacy as requested. Confirmation received.  °

## 2018-07-30 ENCOUNTER — Other Ambulatory Visit: Payer: Self-pay | Admitting: Interventional Cardiology

## 2018-07-30 MED FILL — TAMOXIFEN CITRATE 20 MG TAB: 20 | 90 days supply | Qty: 90 | Fill #0

## 2018-07-30 MED FILL — PROPAFENONE HCL ER 225 MG C: 225 | 90 days supply | Qty: 180 | Fill #0

## 2018-08-11 MED FILL — CELECOXIB 200 MG CAP: 200 | 30 days supply | Qty: 30 | Fill #1

## 2018-08-11 MED FILL — ZOLPIDEM TARTRATE 10 MG TAB: 10 | 30 days supply | Qty: 30 | Fill #4 | Status: TO

## 2018-08-25 MED FILL — ALPRAZolam 0.25 MG TABS: 0.25 | 30 days supply | Qty: 30 | Fill #0

## 2018-08-27 DIAGNOSIS — G4733 Obstructive sleep apnea (adult) (pediatric): Secondary | ICD-10-CM | POA: Diagnosis not present

## 2018-09-03 MED FILL — ROSUVASTATIN CALCIUM 5 MG T: 5 | 90 days supply | Qty: 90 | Fill #0

## 2018-09-03 MED FILL — LEVOTHYROXINE 50 MCG TABLET: 50 | 90 days supply | Qty: 90 | Fill #0

## 2018-09-09 ENCOUNTER — Telehealth: Payer: Self-pay | Admitting: Oncology

## 2018-09-09 NOTE — Telephone Encounter (Signed)
Per schedule message cancelled 4/22 lab/fu and scheduled 4/24 webex. Left message for patient re change.

## 2018-09-10 MED FILL — ZOLPIDEM TARTRATE 10 MG TAB: 10 | 30 days supply | Qty: 30 | Fill #0

## 2018-09-15 ENCOUNTER — Ambulatory Visit: Payer: 59 | Admitting: Oncology

## 2018-09-15 ENCOUNTER — Telehealth: Payer: Self-pay | Admitting: Oncology

## 2018-09-15 ENCOUNTER — Other Ambulatory Visit: Payer: 59

## 2018-09-15 NOTE — Telephone Encounter (Signed)
Left voicemail for patient regarding her Webex appointment. I told her told download the [employer] App onto one of her devices. And sent her join link to [email or web address]. Told her to click the green "join meeting" button and that would populate the appointment details. Would also prompt her to download the app if she hadn't already.   Told her if she was unable to do a video call with Dr. Jana Hakim, to call us back and we would convert it to a phone call visit.

## 2018-09-16 ENCOUNTER — Telehealth: Payer: Self-pay | Admitting: Oncology

## 2018-09-16 ENCOUNTER — Telehealth: Payer: Self-pay | Admitting: *Deleted

## 2018-09-16 NOTE — Telephone Encounter (Signed)
R/s appt per 4/24 sch message - unable to reach patient. Left message with new appt date and time  

## 2018-09-17 ENCOUNTER — Inpatient Hospital Stay: Payer: 59 | Admitting: Oncology

## 2018-09-20 ENCOUNTER — Inpatient Hospital Stay: Payer: 59 | Admitting: Oncology

## 2018-09-21 ENCOUNTER — Inpatient Hospital Stay: Payer: 59 | Attending: Oncology | Admitting: Oncology

## 2018-09-21 DIAGNOSIS — C50211 Malignant neoplasm of upper-inner quadrant of right female breast: Secondary | ICD-10-CM

## 2018-09-21 DIAGNOSIS — Z170 Estrogen receptor positive status [ER+]: Secondary | ICD-10-CM

## 2018-09-21 DIAGNOSIS — Z79899 Other long term (current) drug therapy: Secondary | ICD-10-CM

## 2018-09-21 DIAGNOSIS — Z79810 Long term (current) use of selective estrogen receptor modulators (SERMs): Secondary | ICD-10-CM

## 2018-09-21 DIAGNOSIS — Z793 Long term (current) use of hormonal contraceptives: Secondary | ICD-10-CM

## 2018-09-21 MED ORDER — TAMOXIFEN CITRATE 20 MG PO TABS: 20.0000 mg | ORAL_TABLET | Freq: Every day | ORAL | 3 refills | Status: DC

## 2018-09-21 NOTE — Progress Notes (Signed)
[place]  [phone] [phone]     ID: Pamela Underwood DOB: 1955-09-30  MR#: [id number]  NTI#:[id number]  Patient Care Team: Audley Hose, MD as PCP - General (Internal Medicine) Jettie Booze, MD as PCP - Cardiology (Cardiology) Jettie Booze, MD as Consulting Physician (Cardiology) Everlene Farrier, MD as Consulting Physician (Obstetrics and Gynecology) Josimar Corning, Virgie Dad, MD as Consulting Physician (Oncology) Rolm Bookbinder, MD as Consulting Physician (General Surgery) Arloa Koh, MD as Consulting Physician (Radiation Oncology) OTHER MD: Rolm Bookbinder M.D., Arloa Koh M.D.   CHIEF COMPLAINT: Estrogen receptor positive breast cancer  CURRENT TREATMENT: tamoxifen   BREAST CANCER HISTORY: From the original intake note:  Pamela Underwood had routine screening mammography at [facility] 02/03/2014. This showed a focal asymmetry in the right breast and additional views on 02/06/2014 showed an irregular mass in the right breast, 11:00 position. Ultrasound the same day showed this to be 1.1 cm, with irregular margins, and hypoechoic. There was also an oval lymph nodes with focal cortical thickening in the right axilla. There was also, finally, a 1.1 cm oval mass with circumscribed margins in the right breast upper inner quadrant. Color flow imaging there showed no increase in vascularity.biopsy of the upper outer quadrant right breast mass showed invasive ductal carcinoma. The upper inner quadrant right breast lesion was a fibroadenoma versus a benign phyllodes tumor. The right axillary lymph node that was suspicious was negative for metastatic disease (results are taken from MRI dictation, as the pathology report is not in EPIC).  On 02/08/2014 the patient underwent bilateral breast MRIs. This showed, in the upper outer right breast an irregular enhancing mass measuring 1.4 cm. There were mildly prominent right axillary lymph nodes and  bilateral nonspecific internal mammary lymph nodes measuring up to 4 mm.  On 02/22/2014 the patient underwent right lumpectomy for the upper inner quadrant lesion which proved to be a fibroadenoma. On the same day right upper outer quadrant lumpectomy showed an invasive ductal carcinoma measuring 1.6 cm, in the setting of ductal carcinoma in situ. The invasive tumor was less than a millimeter from the anterior margin. Both sentinel lymph nodes were benign.  The patient's subsequent history is as detailed below   INTERVAL HISTORY: Pamela Underwood is seen today for follow-up and treatment of her estrogen receptor positive breast cancer.  She continues on tamoxifen. She tolerates this well and without any noticeable side effects.    Since her last visit here, she has not undergone any additional studies.    REVIEW OF SYSTEMS: Pamela Underwood has been working one week on and one week off as part of COVID-19 cutbacks. The patient denies unusual headaches, visual changes, nausea, vomiting, or dizziness. There has been no unusual cough, phlegm production, or pleurisy. This been no change in bowel or bladder habits. The patient denies unexplained fatigue or unexplained weight loss, bleeding, rash, or fever. A detailed review of systems was otherwise noncontributory.     PAST MEDICAL HISTORY: Past Medical History:  Diagnosis Date  . Arthritis   . Atrial tachycardia ([place])   . Back pain    occasionally  . Breast cancer ([place]) 02/22/14   right upper inner  . Chronic anxiety    takes Xanax daily as needed when heart rate going up  . Colitis    hx-one  . Hyperlipidemia   . Insomnia    takes Ambien nightly as needed  . Overweight (BMI 25.0-29.9) 12/10/2016  . PSVT (paroxysmal supraventricular tachycardia) ([place])   .  S/P radiation therapy 03/28/2014 through 04/27/2014                                03/28/2014 through 04/27/2014                                                                         Right breast 4250  cGy 17 sessions, right breast boost 1000 cGy in 4 sessions (hypofractionated)                         . Sleep apnea    uses a CPAP    PAST SURGICAL HISTORY: Past Surgical History:  Procedure Laterality Date  . BREAST BIOPSY Left 05/02/1999   benign  . BREAST LUMPECTOMY WITH AXILLARY LYMPH NODE BIOPSY Right 02/22/14   upper inner  . COLONOSCOPY    . CYSTOSCOPY    . KNEE ARTHROSCOPY WITH MEDIAL MENISECTOMY Left 03/31/2013   Procedure: LEFT KNEE ARTHROSCOPY WITH PARTIAL MEDIAL MENISECTOMY, CHONDROPLASTY OF PATELLA  FEMORAL JOINT, EXCISION OF MEDIAL PLICA, PARTIAL LATERAL MENISECTOMY;  Surgeon: [name], MD;  Location: [facility];  Service: Orthopedics;  Laterality: Left;  . TOTAL KNEE ARTHROPLASTY Left 06/29/2013   Procedure: TOTAL KNEE ARTHROPLASTY;  Surgeon: [name], MD;  Location: [facility];  Service: Orthopedics;  Laterality: Left;  . TOTAL KNEE ARTHROPLASTY Right 03/12/2016  . TOTAL KNEE ARTHROPLASTY Right 03/12/2016   Procedure: RIGHT TOTAL KNEE ARTHROPLASTY;  Surgeon: [name], MD;  Location: [facility];  Service: Orthopedics;  Laterality: Right;  . TUBAL LIGATION      FAMILY HISTORY Family History  Problem Relation Age of Onset  . Heart disease Father   . Hypertension Father   . Diabetes Father   . Heart attack Neg Hx    the patient's father died from a myocardial infarction in his 22s. The patient's mother also died in her 62s, after her an accidental fire exposure. The patient had one sister, no brothers. There is no history of breast or ovarian cancer in the family   GYNECOLOGIC HISTORY:  No LMP recorded. Patient is postmenopausal. Menarche age 74, first live birth age 68, the patient is GX P3. She stopped having periods at the age of 36. She did not take hormone replacement.   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 Her husband Pamela Underwood is vice Radio producer for a [employer]. Daughter Pamela Underwood works as a Marine scientist in [place]. Daughter Pamela Underwood teaches kindergarten in [place]. Son Pamela Underwood is Facilities manager in [place]. The patient has no grandchildren. She attends a [employer]    ADVANCED DIRECTIVES: Not in place   HEALTH MAINTENANCE: Social History   Tobacco Use  . Smoking status: Never Smoker  . Smokeless tobacco: Never Used  Substance Use Topics  . Alcohol use: Yes    Comment: socailly  . Drug use: No     Colonoscopy:  PAP:  Bone density:[facility] 05/15/2014 -- normal  Lipid panel:  Allergies  Allergen Reactions  . Escitalopram Oxalate Other (See Comments)    Hives, welts, all over skin rash  . Cardizem  [  Diltiazem Hcl] Hives    Current Outpatient Medications  Medication Sig Dispense Refill  . ALPRAZolam (XANAX) 0.25 MG tablet Take 0.25 mg by mouth daily as needed for anxiety.   4  . aspirin 81 MG tablet Take 81 mg by mouth daily.    . celecoxib (CELEBREX) 200 MG capsule Take 200 mg by mouth daily.    Marland Kitchen levothyroxine (SYNTHROID, LEVOTHROID) 50 MCG tablet Take 50 mcg by mouth daily.    . metoprolol succinate (TOPROL-XL) 50 MG 24 hr tablet TAKE 1 TABLET BY MOUTH DAILY WITH OR IMMEDIATELY FOLLOWING A MEAL 90 tablet 3  . propafenone (RYTHMOL SR) 225 MG 12 hr capsule TAKE 1 CAPSULE BY MOUTH EVERY 12 HOURS 180 capsule 0  . rosuvastatin (CRESTOR) 5 MG tablet TAKE 1 TABLET BY MOUTH DAILY. 90 tablet 2  . tamoxifen (NOLVADEX) 20 MG tablet Take 1 tablet (20 mg total) by mouth daily. 90 tablet 3  . VITAMIN D, CHOLECALCIFEROL, PO Take 1,000 Units by mouth daily.     Marland Kitchen zolpidem (AMBIEN) 10 MG tablet Take 10 mg by mouth at bedtime as needed for sleep.      No current facility-administered medications for this visit.     OBJECTIVE: Middle-aged white woman in no acute distress There were no vitals filed for this visit.   There is no height or weight on file to calculate BMI.    ECOG FS:0 - Asymptomatic  This was a WebEx visit  LAB RESULTS:  Lab work on 02/05/2018 showed a TSH of 2.960.   Basic metabolic panel 32/35/5732 showed a BUN of 19 creatinine 0.78  CMP     Component Value Date/Time   NA 141 07/13/2018 1620   NA 140 06/06/2015 1556   K 4.5 07/13/2018 1620   K 4.4 06/06/2015 1556   CL 104 07/13/2018 1620   CO2 23 07/13/2018 1620   CO2 24 06/06/2015 1556   GLUCOSE 91 07/13/2018 1620   GLUCOSE 121 (H) 03/14/2016 0613   GLUCOSE 105 06/06/2015 1556   BUN 19 07/13/2018 1620   BUN 12.7 06/06/2015 1556   CREATININE 0.78 07/13/2018 1620   CREATININE 0.9 06/06/2015 1556   CALCIUM 8.5 (L) 07/13/2018 1620   CALCIUM 8.9 06/06/2015 1556   PROT 6.7 02/29/2016 1030   PROT 6.9 06/06/2015 1556   ALBUMIN 4.2 02/29/2016 1030   ALBUMIN 4.0 06/06/2015 1556   AST 23 02/29/2016 1030   AST 20 06/06/2015 1556   ALT 20 02/29/2016 1030   ALT 21 06/06/2015 1556   ALKPHOS 53 02/29/2016 1030   ALKPHOS 64 06/06/2015 1556   BILITOT 0.2 (L) 02/29/2016 1030   BILITOT 0.47 06/06/2015 1556   GFRNONAA 82 07/13/2018 1620   GFRAA 95 07/13/2018 1620    I No results found for: SPEP  Lab Results  Component Value Date   WBC 12.7 (H) 03/14/2016   NEUTROABS 3.5 02/29/2016   HGB 10.6 (L) 03/14/2016   HCT 31.8 (L) 03/14/2016   MCV 95.2 03/14/2016   PLT 146 (L) 03/14/2016      Chemistry      Component Value Date/Time   NA 141 07/13/2018 1620   NA 140 06/06/2015 1556   K 4.5 07/13/2018 1620   K 4.4 06/06/2015 1556   CL 104 07/13/2018 1620   CO2 23 07/13/2018 1620   CO2 24 06/06/2015 1556   BUN 19 07/13/2018 1620   BUN 12.7 06/06/2015 1556   CREATININE 0.78 07/13/2018 1620   CREATININE 0.9 06/06/2015 1556  Component Value Date/Time   CALCIUM 8.5 (L) 07/13/2018 1620   CALCIUM 8.9 06/06/2015 1556   ALKPHOS 53 02/29/2016 1030   ALKPHOS 64 06/06/2015 1556   AST 23 02/29/2016 1030   AST 20 06/06/2015 1556   ALT 20 02/29/2016 1030   ALT 21 06/06/2015 1556   BILITOT 0.2 (L) 02/29/2016 1030   BILITOT 0.47 06/06/2015 1556       No results found for: LABCA2  No  components found for: LABCA125  No results for input(s): INR in the last 168 hours.  Urinalysis    Component Value Date/Time   COLORURINE YELLOW 02/29/2016 1035   APPEARANCEUR CLEAR 02/29/2016 1035   LABSPEC 1.003 (L) 02/29/2016 1035   PHURINE 6.0 02/29/2016 1035   GLUCOSEU NEGATIVE 02/29/2016 1035   HGBUR SMALL (A) 02/29/2016 1035   BILIRUBINUR NEGATIVE 02/29/2016 1035   KETONESUR NEGATIVE 02/29/2016 1035   PROTEINUR NEGATIVE 02/29/2016 1035   UROBILINOGEN 0.2 06/27/2013 1401   NITRITE NEGATIVE 02/29/2016 1035   LEUKOCYTESUR NEGATIVE 02/29/2016 1035    STUDIES: No results found.    ASSESSMENT: 63 y.o. [place] woman status post right lumpectomy and sentinel lymph node sampling 02/22/2014 for a pT1c pN0, stage IA invasive ductal carcinoma, grade 1 estrogen receptor 100% positive, progesterone receptor 85% positive, with no HER-2 amplification and an MIB-1 of 12%  (1) OncotypeDX 02/28/2014 score of 8 predicts an outside-the-breast recurrence of 6% if the patient's only systemic treatment is tamoxifen for 5 years. It also predicts no benefit from chemotherapy  (2) completed adjuvant radiation 04/27/2014: 4250 cGy to the right breast with a 1000 cGy boost to the scar  (3) started tamoxifen January 2016--plan is to continue for total of 10 years    PLAN: Pamela Underwood is now nearly 5 years out from definitive surgery for breast cancer with no evidence of disease recurrence.  This is very favorable.  I had an error in my note: Her tamoxifen actually started January 2016 so she is a little bit over 4 years out from starting not 5 years as I mentioned to her today by WebEx.  Nevertheless we discussed whether or not to continue tamoxifen for a total of 10 years.  She understands the benefits are much less for the next 5 years, perhaps a 3% further risk reduction.  Since she is having no side effects from the treatment, gets it essentially for free, and in addition to the breast cancer  risk reduction does get some benefit in terms of bone density issues, she is very interested in continuing tamoxifen for a total of 10 years and that accordingly is what we plan to do.  Her next mammogram will be due early October.  I will see her mid-October and continue to see her on a yearly basis beyond that  She knows to call for any other issues that may develop before then.   Pamela Underwood, Virgie Dad, MD  09/21/18 2:34 PM Medical Oncology and Hematology [facility] [address] [place], [place] [address] Tel. [phone]    Fax. [phone]  I, Jacqualyn Posey am acting as a Education administrator for Chauncey Cruel, MD.   I, Lurline Del MD, have reviewed the above documentation for accuracy and completeness, and I agree with the above.

## 2018-09-23 MED FILL — ALPRAZolam 0.25 MG TABS: 0.25 | 30 days supply | Qty: 30 | Fill #1

## 2018-10-11 MED FILL — ZOLPIDEM TARTRATE 10 MG TAB: 10 | 30 days supply | Qty: 30 | Fill #0

## 2018-10-22 ENCOUNTER — Other Ambulatory Visit: Payer: Self-pay | Admitting: Interventional Cardiology

## 2018-10-22 MED FILL — PROPAFENONE HCL ER 225 MG C: 225 | 90 days supply | Qty: 180 | Fill #0

## 2018-10-22 MED FILL — METOPROLOL SUCCINATE ER 50: 50 | 90 days supply | Qty: 90 | Fill #1

## 2018-10-25 MED FILL — TAMOXIFEN CITRATE 20 MG TAB: 20 | 90 days supply | Qty: 90 | Fill #0

## 2018-11-05 MED FILL — AMOXICILLIN 500 MG CAPSULE: 500 | 1 days supply | Qty: 4 | Fill #0

## 2018-11-10 MED FILL — ZOLPIDEM TARTRATE 10 MG TAB: 10 | 30 days supply | Qty: 30 | Fill #0

## 2018-11-20 MED FILL — CELECOXIB 200 MG CAP: 200 | 30 days supply | Qty: 30 | Fill #0

## 2018-11-20 MED FILL — ALPRAZolam 0.25 MG TABS: 0.25 | 30 days supply | Qty: 30 | Fill #2

## 2018-11-22 DIAGNOSIS — G4733 Obstructive sleep apnea (adult) (pediatric): Secondary | ICD-10-CM | POA: Diagnosis not present

## 2018-12-10 MED FILL — ZOLPIDEM TARTRATE 10 MG TAB: 10 | 30 days supply | Qty: 30 | Fill #1

## 2018-12-20 MED FILL — ALPRAZolam 0.25 MG TABS: 0.25 | 30 days supply | Qty: 30 | Fill #3

## 2018-12-28 MED FILL — LEVOTHYROXINE 50 MCG TABLET: 50 | 90 days supply | Qty: 90 | Fill #0

## 2019-01-10 MED FILL — ZOLPIDEM TARTRATE 10 MG TAB: 10 | 30 days supply | Qty: 30 | Fill #0

## 2019-01-21 ENCOUNTER — Other Ambulatory Visit: Payer: Self-pay | Admitting: Oncology

## 2019-01-21 MED FILL — METOPROLOL SUCCINATE ER 50: 50 | 90 days supply | Qty: 90 | Fill #2

## 2019-01-21 MED FILL — PROPAFENONE HCL ER 225 MG C: 225 | 90 days supply | Qty: 180 | Fill #1

## 2019-01-24 MED FILL — ROSUVASTATIN CALCIUM 5 MG T: 5 | 90 days supply | Qty: 90 | Fill #0

## 2019-02-03 MED FILL — TAMOXIFEN 20 MG TABLET: 20 | 90 days supply | Qty: 90 | Fill #1

## 2019-02-08 MED FILL — ALPRAZolam 0.25 MG TABS: 0.25 | 30 days supply | Qty: 30 | Fill #0

## 2019-02-09 MED FILL — ZOLPIDEM TARTRATE 10 MG TAB: 10 | 30 days supply | Qty: 30 | Fill #1

## 2019-03-09 MED FILL — ZOLPIDEM TARTRATE 10 MG TAB: 10 | 30 days supply | Qty: 30 | Fill #0

## 2019-03-11 MED FILL — ALPRAZolam 0.25 MG TABS: 0.25 | 30 days supply | Qty: 30 | Fill #1

## 2019-04-08 MED FILL — ZOLPIDEM TARTRATE 10 MG TAB: 10 | 30 days supply | Qty: 30 | Fill #1

## 2019-04-15 MED FILL — ALPRAZolam 0.25 MG TABS: 0.25 | 30 days supply | Qty: 30 | Fill #0

## 2019-04-22 MED FILL — METOPROLOL SUCCINATE ER 50: 50 | 90 days supply | Qty: 90 | Fill #3

## 2019-04-22 MED FILL — PROPAFENONE HCL ER 225 MG C: 225 | 90 days supply | Qty: 180 | Fill #2

## 2019-05-04 DIAGNOSIS — N952 Postmenopausal atrophic vaginitis: Secondary | ICD-10-CM | POA: Diagnosis not present

## 2019-05-04 DIAGNOSIS — F419 Anxiety disorder, unspecified: Secondary | ICD-10-CM | POA: Diagnosis not present

## 2019-05-04 DIAGNOSIS — R87610 Atypical squamous cells of undetermined significance on cytologic smear of cervix (ASC-US): Secondary | ICD-10-CM | POA: Diagnosis not present

## 2019-05-04 DIAGNOSIS — Z01419 Encounter for gynecological examination (general) (routine) without abnormal findings: Secondary | ICD-10-CM | POA: Diagnosis not present

## 2019-05-04 DIAGNOSIS — F5104 Psychophysiologic insomnia: Secondary | ICD-10-CM | POA: Diagnosis not present

## 2019-05-04 DIAGNOSIS — Z6831 Body mass index (BMI) 31.0-31.9, adult: Secondary | ICD-10-CM | POA: Diagnosis not present

## 2019-05-05 ENCOUNTER — Other Ambulatory Visit: Payer: Self-pay | Admitting: Oncology

## 2019-05-06 MED FILL — ROSUVASTATIN CALCIUM 5 MG T: 5 | 90 days supply | Qty: 90 | Fill #0

## 2019-05-06 MED FILL — ZOLPIDEM TARTRATE 10 MG TAB: 10 | 30 days supply | Qty: 30 | Fill #0

## 2019-05-09 DIAGNOSIS — G4733 Obstructive sleep apnea (adult) (pediatric): Secondary | ICD-10-CM | POA: Diagnosis not present

## 2019-05-09 MED FILL — TAMOXIFEN 20 MG TABLET: 20 | 90 days supply | Qty: 90 | Fill #2

## 2019-05-30 ENCOUNTER — Encounter: Payer: Self-pay | Admitting: Oncology

## 2019-05-30 DIAGNOSIS — Z853 Personal history of malignant neoplasm of breast: Secondary | ICD-10-CM | POA: Diagnosis not present

## 2019-05-30 DIAGNOSIS — R921 Mammographic calcification found on diagnostic imaging of breast: Secondary | ICD-10-CM | POA: Diagnosis not present

## 2019-05-30 MED FILL — ALPRAZolam 0.25 MG TABS: 0.25 | 30 days supply | Qty: 30 | Fill #0

## 2019-06-06 MED FILL — ZOLPIDEM TARTRATE 10 MG TAB: 10 | 30 days supply | Qty: 30 | Fill #1

## 2019-06-15 NOTE — Telephone Encounter (Signed)
No entry 

## 2019-07-05 DIAGNOSIS — I471 Supraventricular tachycardia: Secondary | ICD-10-CM | POA: Diagnosis not present

## 2019-07-05 DIAGNOSIS — E039 Hypothyroidism, unspecified: Secondary | ICD-10-CM | POA: Diagnosis not present

## 2019-07-05 DIAGNOSIS — Z853 Personal history of malignant neoplasm of breast: Secondary | ICD-10-CM | POA: Diagnosis not present

## 2019-07-05 DIAGNOSIS — E782 Mixed hyperlipidemia: Secondary | ICD-10-CM | POA: Diagnosis not present

## 2019-07-05 DIAGNOSIS — Z6831 Body mass index (BMI) 31.0-31.9, adult: Secondary | ICD-10-CM | POA: Diagnosis not present

## 2019-07-05 DIAGNOSIS — G4733 Obstructive sleep apnea (adult) (pediatric): Secondary | ICD-10-CM | POA: Diagnosis not present

## 2019-07-05 DIAGNOSIS — R030 Elevated blood-pressure reading, without diagnosis of hypertension: Secondary | ICD-10-CM | POA: Diagnosis not present

## 2019-07-05 MED FILL — LEVOTHYROXINE 50 MCG TABLET: 50 | 90 days supply | Qty: 90 | Fill #0

## 2019-07-06 MED FILL — ALPRAZolam 0.25 MG TABS: 0.25 | 30 days supply | Qty: 30 | Fill #1

## 2019-07-06 MED FILL — ZOLPIDEM TARTRATE 10 MG TAB: 10 | 30 days supply | Qty: 30 | Fill #2

## 2019-07-19 ENCOUNTER — Other Ambulatory Visit: Payer: Self-pay | Admitting: Interventional Cardiology

## 2019-07-19 MED FILL — PROPAFENONE HCL ER 225 MG C: 225 | 90 days supply | Qty: 180 | Fill #0

## 2019-07-19 MED FILL — METOPROLOL SUCCINATE ER 50: 50 | 90 days supply | Qty: 90 | Fill #0

## 2019-07-20 DIAGNOSIS — E782 Mixed hyperlipidemia: Secondary | ICD-10-CM | POA: Diagnosis not present

## 2019-07-20 DIAGNOSIS — R030 Elevated blood-pressure reading, without diagnosis of hypertension: Secondary | ICD-10-CM | POA: Diagnosis not present

## 2019-07-20 DIAGNOSIS — E039 Hypothyroidism, unspecified: Secondary | ICD-10-CM | POA: Diagnosis not present

## 2019-08-03 DIAGNOSIS — G4733 Obstructive sleep apnea (adult) (pediatric): Secondary | ICD-10-CM | POA: Diagnosis not present

## 2019-08-05 MED FILL — ZOLPIDEM TARTRATE 10 MG TAB: 10 | 30 days supply | Qty: 30 | Fill #3

## 2019-08-05 MED FILL — ALPRAZolam 0.25 MG TABS: 0.25 | 30 days supply | Qty: 30 | Fill #2

## 2019-08-15 ENCOUNTER — Other Ambulatory Visit: Payer: Self-pay | Admitting: Oncology

## 2019-08-15 MED FILL — ROSUVASTATIN CALCIUM 5 MG T: 5 | 90 days supply | Qty: 90 | Fill #0

## 2019-08-15 MED FILL — TAMOXIFEN 20 MG TABLET: 20 | 90 days supply | Qty: 90 | Fill #3

## 2019-08-16 ENCOUNTER — Ambulatory Visit: Payer: 59 | Admitting: Interventional Cardiology

## 2019-09-06 MED FILL — ZOLPIDEM TARTRATE 10 MG TAB: 10 | 30 days supply | Qty: 30 | Fill #0

## 2019-09-06 NOTE — Progress Notes (Signed)
Cardiology Office Note   Date:  09/08/2019   ID:  Pamela Underwood, DOB 1956/04/20, MRN [id number]  PCP:  Audley Hose, MD    No chief complaint on file.  SVT  Wt Readings from Last 3 Encounters:  09/08/19 198 lb 6.4 oz (90 kg)  01/06/15 185 lb (83.9 kg)  02/22/14 185 lb (83.9 kg)       History of Present Illness: Pamela Underwood is a 63 y.o. female  who has had SVT. Controlled on propafenone and diltiazem. She has declined evaluation for ablation in the past. She had breast cancer treated with surgery and radiation. She had no SVT with that process.   She had a TKR in October of 2017.   Follws up with Dr. Jana Hakim for her Breast cancer history.  She had a reaction to diltiazem and was swtiched to toprol with decrease in SVT.  She had been working as an Haematologist in Programme researcher, broadcasting/film/video.  Since the last visit, she has had some stress.  She has noted some HR to the 105 range.      Past Medical History:  Diagnosis Date  . Arthritis   . Atrial tachycardia ([place])   . Back pain    occasionally  . Breast cancer ([place]) 02/22/14   right upper inner  . Chronic anxiety    takes Xanax daily as needed when heart rate going up  . Colitis    hx-one  . Hyperlipidemia   . Insomnia    takes Ambien nightly as needed  . Overweight (BMI 25.0-29.9) 12/10/2016  . PSVT (paroxysmal supraventricular tachycardia) ([place])   . S/P radiation therapy 03/28/2014 through 04/27/2014                                03/28/2014 through 04/27/2014                                                                         Right breast 4250 cGy 17 sessions, right breast boost 1000 cGy in 4 sessions (hypofractionated)                         . Sleep apnea    uses a CPAP    Past Surgical History:  Procedure Laterality Date  . BREAST BIOPSY Left 05/02/1999   benign  . BREAST LUMPECTOMY WITH AXILLARY LYMPH NODE BIOPSY Right 02/22/14   upper inner  . COLONOSCOPY    . CYSTOSCOPY    . KNEE ARTHROSCOPY  WITH MEDIAL MENISECTOMY Left 03/31/2013   Procedure: LEFT KNEE ARTHROSCOPY WITH PARTIAL MEDIAL MENISECTOMY, CHONDROPLASTY OF PATELLA  FEMORAL JOINT, EXCISION OF MEDIAL PLICA, PARTIAL LATERAL MENISECTOMY;  Surgeon: [name], MD;  Location: [facility];  Service: Orthopedics;  Laterality: Left;  . TOTAL KNEE ARTHROPLASTY Left 06/29/2013   Procedure: TOTAL KNEE ARTHROPLASTY;  Surgeon: [name], MD;  Location: [facility];  Service: Orthopedics;  Laterality: Left;  . TOTAL KNEE ARTHROPLASTY Right 03/12/2016  . TOTAL KNEE ARTHROPLASTY Right 03/12/2016   Procedure: RIGHT TOTAL KNEE ARTHROPLASTY;  Surgeon: [name], MD;  Location: [facility];  Service: Orthopedics;  Laterality: Right;  . TUBAL LIGATION       Current Outpatient Medications  Medication Sig Dispense Refill  . ALPRAZolam (XANAX) 0.25 MG tablet Take 0.25 mg by mouth daily as needed for anxiety.   4  . aspirin 81 MG tablet Take 81 mg by mouth daily.    . celecoxib (CELEBREX) 200 MG capsule Take 200 mg by mouth daily.    Marland Kitchen levothyroxine (SYNTHROID, LEVOTHROID) 50 MCG tablet Take 50 mcg by mouth daily.    . metoprolol succinate (TOPROL-XL) 50 MG 24 hr tablet TAKE 1 TABLET BY MOUTH DAILY WITH OR IMMEDIATELY FOLLOWING A MEAL 90 tablet 0  . propafenone (RYTHMOL SR) 225 MG 12 hr capsule TAKE 1 CAPSULE BY MOUTH EVERY 12 HOURS 180 capsule 0  . rosuvastatin (CRESTOR) 5 MG tablet TAKE 1 TABLET BY MOUTH DAILY. 90 tablet 0  . tamoxifen (NOLVADEX) 20 MG tablet Take 1 tablet (20 mg total) by mouth daily. 90 tablet 3  . VITAMIN D, CHOLECALCIFEROL, PO Take 1,000 Units by mouth daily.     Marland Kitchen zolpidem (AMBIEN) 10 MG tablet Take 10 mg by mouth at bedtime as needed for sleep.      No current facility-administered medications for this visit.    Allergies:   Escitalopram oxalate and Cardizem  [diltiazem hcl]    Social History:  The patient  reports that she has never smoked. She has never used smokeless tobacco. She reports current  alcohol use. She reports that she does not use drugs.   Family History:  The patient's family history includes Diabetes in her father; Heart disease in her father; Hypertension in her father.    ROS:  Please see the history of present illness.   Otherwise, review of systems are positive for stress.   All other systems are reviewed and negative.    PHYSICAL EXAM: VS:  BP 140/82   Pulse 98   Ht 5\' 7"  (1.702 m)   Wt 198 lb 6.4 oz (90 kg)   SpO2 98%   BMI 31.07 kg/m  , BMI Body mass index is 31.07 kg/m. GEN: Well nourished, well developed, in no acute distress  HEENT: normal  Neck: no JVD, carotid bruits, or masses Cardiac: RRR; no murmurs, rubs, or gallops,no edema  Respiratory:  clear to auscultation bilaterally, normal work of breathing GI: soft, nontender, nondistended, + BS MS: no deformity or atrophy  Skin: warm and dry, no rash Neuro:  Strength and sensation are intact Psych: euthymic mood, full affect   EKG:   The ekg ordered today demonstrates NSR, no ST changes   Recent Labs: No results found for requested labs within last 8760 hours.   Lipid Panel    Component Value Date/Time   CHOL 254 (H) 06/12/2014 0837   TRIG 84 06/12/2014 0837   HDL 84 06/12/2014 0837   CHOLHDL 3.0 06/12/2014 0837   VLDL 17 06/12/2014 0837   LDLCALC 153 (H) 06/12/2014 0837     Other studies Reviewed: Additional studies/ records that were reviewed today with results demonstrating: Labs from PMD reviewed- glucose 120 at last check but she was not fasting.- [facility]   ASSESSMENT AND PLAN:  1. SVT: No SVT.  Pulse is tending to be in the higher end of the normal range.  OK to take an extra half of metoprolol tab if she has palpitations. HR increased at MDs office.  Average HR on her Apple watch is 90. 2. Hyperlipidemia: Followed by Dr. Maia Petties.  Continue  rosuvastatin. 3. Borderline BP noted in the past. Also in the higher end of normal range at home.  COntinue to monitor.    4. Healthy, whole food, plant based diet recommended.  Avoiding carbs already. 5. Hypothyroid: managed by PMD.  Could affect HR if this was not controlled.   Current medicines are reviewed at length with the patient today.  The patient concerns regarding her medicines were addressed.  The following changes have been made:  No change  Labs/ tests ordered today include:  No orders of the defined types were placed in this encounter.   Recommend 150 minutes/week of aerobic exercise Low fat, low carb, high fiber diet recommended  Disposition:   FU in  1 year   Signed, Larae Grooms, MD  09/08/2019 8:53 AM    [facility] [place], [place], [place]  [address] Phone: [phone]; Fax: [phone]

## 2019-09-08 ENCOUNTER — Ambulatory Visit: Payer: 59 | Admitting: Interventional Cardiology

## 2019-09-08 ENCOUNTER — Other Ambulatory Visit: Payer: Self-pay

## 2019-09-08 ENCOUNTER — Encounter: Payer: Self-pay | Admitting: Interventional Cardiology

## 2019-09-08 ENCOUNTER — Encounter (INDEPENDENT_AMBULATORY_CARE_PROVIDER_SITE_OTHER): Payer: Self-pay

## 2019-09-08 VITALS — BP 140/82 | HR 98 | Ht 67.0 in | Wt 198.4 lb

## 2019-09-08 DIAGNOSIS — E782 Mixed hyperlipidemia: Secondary | ICD-10-CM | POA: Diagnosis not present

## 2019-09-08 DIAGNOSIS — I471 Supraventricular tachycardia: Secondary | ICD-10-CM | POA: Diagnosis not present

## 2019-09-08 DIAGNOSIS — E039 Hypothyroidism, unspecified: Secondary | ICD-10-CM

## 2019-09-08 NOTE — Patient Instructions (Signed)

## 2019-09-12 MED FILL — ALPRAZolam 0.25 MG TABS: 0.25 | 30 days supply | Qty: 30 | Fill #3

## 2019-10-11 MED FILL — LEVOTHYROXINE 50 MCG TABLET: 50 | 90 days supply | Qty: 90 | Fill #1

## 2019-10-19 ENCOUNTER — Other Ambulatory Visit: Payer: Self-pay | Admitting: Interventional Cardiology

## 2019-10-20 ENCOUNTER — Other Ambulatory Visit: Payer: Self-pay | Admitting: Interventional Cardiology

## 2019-10-20 MED FILL — PROPAFENONE HCL ER 225 MG C: 225 | 90 days supply | Qty: 180 | Fill #0

## 2019-10-20 MED FILL — METOPROLOL SUCCINATE ER 50: 50 | 90 days supply | Qty: 90 | Fill #0

## 2019-10-21 MED FILL — ALPRAZolam 0.25 MG TABS: 0.25 | 30 days supply | Qty: 30 | Fill #0

## 2019-10-31 DIAGNOSIS — G4733 Obstructive sleep apnea (adult) (pediatric): Secondary | ICD-10-CM | POA: Diagnosis not present

## 2019-11-07 MED FILL — ZOLPIDEM TARTRATE 10 MG TAB: 10 | 30 days supply | Qty: 30 | Fill #2

## 2019-11-15 ENCOUNTER — Other Ambulatory Visit: Payer: Self-pay | Admitting: Oncology

## 2019-11-15 DIAGNOSIS — Z170 Estrogen receptor positive status [ER+]: Secondary | ICD-10-CM

## 2019-11-16 ENCOUNTER — Telehealth: Payer: Self-pay | Admitting: Oncology

## 2019-11-16 MED FILL — TAMOXIFEN 20 MG TABLET: 20 | 90 days supply | Qty: 90 | Fill #0

## 2019-11-16 MED FILL — ROSUVASTATIN CALCIUM 5 MG T: 5 | 90 days supply | Qty: 90 | Fill #0

## 2019-11-16 NOTE — Telephone Encounter (Signed)
Scheduled per 6/23 sch message. Pt is aware of appt time and date. Could only do afternoons.

## 2019-11-29 MED FILL — ALPRAZolam 0.25 MG TABS: 0.25 | 30 days supply | Qty: 30 | Fill #1

## 2019-12-07 MED FILL — ZOLPIDEM TARTRATE 10 MG TAB: 10 | 30 days supply | Qty: 30 | Fill #3

## 2019-12-30 MED FILL — ALPRAZolam 0.25 MG TABS: 0.25 | 30 days supply | Qty: 30 | Fill #2

## 2020-01-06 MED FILL — ZOLPIDEM TARTRATE 10 MG TAB: 10 | 30 days supply | Qty: 30 | Fill #0

## 2020-01-13 MED FILL — METOPROLOL SUCCINATE ER 50: 50 | 90 days supply | Qty: 90 | Fill #1

## 2020-01-22 NOTE — Progress Notes (Signed)
[facility]  Telephone:([phone]) [phone] Fax:(336) [phone]     ID: DESTENI PISCOPO DOB: Dec 02, 1955  MR#: [id number]  BSJ#:[id number]  Patient Care Team: Audley Hose, MD as PCP - General (Internal Medicine) Jettie Booze, MD as PCP - Cardiology (Cardiology) Jettie Booze, MD as Consulting Physician (Cardiology) Everlene Farrier, MD as Consulting Physician (Obstetrics and Gynecology) Delano Frate, Virgie Dad, MD as Consulting Physician (Oncology) Rolm Bookbinder, MD as Consulting Physician (General Surgery) Arloa Koh, MD (Inactive) as Consulting Physician (Radiation Oncology) OTHER MD: Rolm Bookbinder M.D., Arloa Koh M.D.   CHIEF COMPLAINT: Estrogen receptor positive breast cancer  CURRENT TREATMENT: tamoxifen   INTERVAL HISTORY: Amiayah was scheduled today for follow-up of her estrogen receptor positive breast cancer.  However she did not show for her visit    REVIEW OF SYSTEMS: Magda Paganini    BREAST CANCER HISTORY: From the original intake note:  Amamda had routine screening mammography at [facility] 02/03/2014. This showed a focal asymmetry in the right breast and additional views on 02/06/2014 showed an irregular mass in the right breast, 11:00 position. Ultrasound the same day showed this to be 1.1 cm, with irregular margins, and hypoechoic. There was also an oval lymph nodes with focal cortical thickening in the right axilla. There was also, finally, a 1.1 cm oval mass with circumscribed margins in the right breast upper inner quadrant. Color flow imaging there showed no increase in vascularity.biopsy of the upper outer quadrant right breast mass showed invasive ductal carcinoma. The upper inner quadrant right breast lesion was a fibroadenoma versus a benign phyllodes tumor. The right axillary lymph node that was suspicious was negative for metastatic disease (results are taken from MRI dictation, as the pathology report is not in EPIC).  On  02/08/2014 the patient underwent bilateral breast MRIs. This showed, in the upper outer right breast an irregular enhancing mass measuring 1.4 cm. There were mildly prominent right axillary lymph nodes and bilateral nonspecific internal mammary lymph nodes measuring up to 4 mm.  On 02/22/2014 the patient underwent right lumpectomy for the upper inner quadrant lesion which proved to be a fibroadenoma. On the same day right upper outer quadrant lumpectomy showed an invasive ductal carcinoma measuring 1.6 cm, in the setting of ductal carcinoma in situ. The invasive tumor was less than a millimeter from the anterior margin. Both sentinel lymph nodes were benign.  The patient's subsequent history is as detailed below    PAST MEDICAL HISTORY: Past Medical History:  Diagnosis Date   Arthritis    Atrial tachycardia ([place])    Back pain    occasionally   Breast cancer ([facility]) 02/22/14   right upper inner   Chronic anxiety    takes Xanax daily as needed when heart rate going up   Colitis    hx-one   Hyperlipidemia    Insomnia    takes Ambien nightly as needed   Overweight (BMI 25.0-29.9) 63/18/2018   PSVT (paroxysmal supraventricular tachycardia) ([place])    S/P radiation therapy 03/28/2014 through 04/27/2014                                03/28/2014 through 04/27/2014  Right breast 4250 cGy 17 sessions, right breast boost 1000 cGy in 4 sessions (hypofractionated)                          Sleep apnea    uses a CPAP    PAST SURGICAL HISTORY: Past Surgical History:  Procedure Laterality Date   BREAST BIOPSY Left 05/02/1999   benign   BREAST LUMPECTOMY WITH AXILLARY LYMPH NODE BIOPSY Right 02/22/14   upper inner   COLONOSCOPY     CYSTOSCOPY     KNEE ARTHROSCOPY WITH MEDIAL MENISECTOMY Left 03/31/2013   Procedure: LEFT KNEE ARTHROSCOPY WITH PARTIAL MEDIAL MENISECTOMY, CHONDROPLASTY OF PATELLA  FEMORAL JOINT,  EXCISION OF MEDIAL PLICA, PARTIAL LATERAL MENISECTOMY;  Surgeon: [name], MD;  Location: [facility];  Service: Orthopedics;  Laterality: Left;   TOTAL KNEE ARTHROPLASTY Left 06/29/2013   Procedure: TOTAL KNEE ARTHROPLASTY;  Surgeon: [name], MD;  Location: [facility];  Service: Orthopedics;  Laterality: Left;   TOTAL KNEE ARTHROPLASTY Right 03/12/2016   TOTAL KNEE ARTHROPLASTY Right 03/12/2016   Procedure: RIGHT TOTAL KNEE ARTHROPLASTY;  Surgeon: [name], MD;  Location: [facility];  Service: Orthopedics;  Laterality: Right;   TUBAL LIGATION      FAMILY HISTORY Family History  Problem Relation Age of Onset   Heart disease Father    Hypertension Father    Diabetes Father    Heart attack Neg Hx    the patient's father died from a myocardial infarction in his 31s. The patient's mother also died in her 61s, after her an accidental fire exposure. The patient had one sister, no brothers. There is no history of breast or ovarian cancer in the family   GYNECOLOGIC HISTORY:  No LMP recorded. Patient is postmenopausal. Menarche age 64, first live birth age 24, the patient is GX P3. She stopped having periods at the age of 64. She did not take hormone replacement.   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 Her husband Ronalee Belts is vice Radio producer for a [employer]. Daughter Vida Roller works as a Marine scientist in [place]. Daughter Florentina Jenny teaches kindergarten in [place]. Son Selinda Flavin is Facilities manager in [place]. The patient has no grandchildren. She attends a [employer]    ADVANCED DIRECTIVES: Not in place   HEALTH MAINTENANCE: Social History   Tobacco Use   Smoking status: Never Smoker   Smokeless tobacco: Never Used  Substance Use Topics   Alcohol use: Yes    Comment: socailly   Drug use: No     Colonoscopy:  PAP:  Bone density:[facility] 05/15/2014 -- normal  Lipid panel:  Allergies  Allergen Reactions    Escitalopram Oxalate Other (See Comments)    Hives, welts, all over skin rash   Cardizem  [Diltiazem Hcl] Hives    Current Outpatient Medications  Medication Sig Dispense Refill   ALPRAZolam (XANAX) 0.25 MG tablet Take 0.25 mg by mouth daily as needed for anxiety.   4   aspirin 81 MG tablet Take 81 mg by mouth daily.     celecoxib (CELEBREX) 200 MG capsule Take 200 mg by mouth daily.     levothyroxine (SYNTHROID, LEVOTHROID) 50 MCG tablet Take 50 mcg by mouth daily.     metoprolol succinate (TOPROL-XL) 50 MG 24 hr tablet TAKE 1 TABLET BY MOUTH DAILY WITH OR IMMEDIATELY FOLLOWING A MEAL 90 tablet 3   propafenone (RYTHMOL SR) 225 MG 12  hr capsule TAKE 1 CAPSULE BY MOUTH EVERY 12 HOURS 180 capsule 3   rosuvastatin (CRESTOR) 5 MG tablet TAKE 1 TABLET BY MOUTH DAILY. 90 tablet 0   tamoxifen (NOLVADEX) 20 MG tablet TAKE 1 TABLET (20 MG TOTAL) BY MOUTH DAILY. 90 tablet 0   VITAMIN D, CHOLECALCIFEROL, PO Take 1,000 Units by mouth daily.      zolpidem (AMBIEN) 10 MG tablet Take 10 mg by mouth at bedtime as needed for sleep.      No current facility-administered medications for this visit.    OBJECTIVE:  There were no vitals filed for this visit.   There is no height or weight on file to calculate BMI.    ECOG FS:    LAB RESULTS:  CMP     Component Value Date/Time   NA 141 07/13/2018 1620   NA 140 06/06/2015 1556   K 4.5 07/13/2018 1620   K 4.4 06/06/2015 1556   CL 104 07/13/2018 1620   CO2 23 07/13/2018 1620   CO2 24 06/06/2015 1556   GLUCOSE 91 07/13/2018 1620   GLUCOSE 121 (H) 03/14/2016 0613   GLUCOSE 105 06/06/2015 1556   BUN 19 07/13/2018 1620   BUN 12.7 06/06/2015 1556   CREATININE 0.78 07/13/2018 1620   CREATININE 0.9 06/06/2015 1556   CALCIUM 8.5 (L) 07/13/2018 1620   CALCIUM 8.9 06/06/2015 1556   PROT 6.7 02/29/2016 1030   PROT 6.9 06/06/2015 1556   ALBUMIN 4.2 02/29/2016 1030   ALBUMIN 4.0 06/06/2015 1556   AST 23 02/29/2016 1030   AST 20 06/06/2015  1556   ALT 20 02/29/2016 1030   ALT 21 06/06/2015 1556   ALKPHOS 53 02/29/2016 1030   ALKPHOS 64 06/06/2015 1556   BILITOT 0.2 (L) 02/29/2016 1030   BILITOT 0.47 06/06/2015 1556   GFRNONAA 82 07/13/2018 1620   GFRAA 95 07/13/2018 1620    I No results found for: SPEP  Lab Results  Component Value Date   WBC 12.7 (H) 03/14/2016   NEUTROABS 3.5 02/29/2016   HGB 10.6 (L) 03/14/2016   HCT 31.8 (L) 03/14/2016   MCV 95.2 03/14/2016   PLT 146 (L) 03/14/2016      Chemistry      Component Value Date/Time   NA 141 07/13/2018 1620   NA 140 06/06/2015 1556   K 4.5 07/13/2018 1620   K 4.4 06/06/2015 1556   CL 104 07/13/2018 1620   CO2 23 07/13/2018 1620   CO2 24 06/06/2015 1556   BUN 19 07/13/2018 1620   BUN 12.7 06/06/2015 1556   CREATININE 0.78 07/13/2018 1620   CREATININE 0.9 06/06/2015 1556      Component Value Date/Time   CALCIUM 8.5 (L) 07/13/2018 1620   CALCIUM 8.9 06/06/2015 1556   ALKPHOS 53 02/29/2016 1030   ALKPHOS 64 06/06/2015 1556   AST 23 02/29/2016 1030   AST 20 06/06/2015 1556   ALT 20 02/29/2016 1030   ALT 21 06/06/2015 1556   BILITOT 0.2 (L) 02/29/2016 1030   BILITOT 0.47 06/06/2015 1556       No results found for: LABCA2  No components found for: LABCA125  No results for input(s): INR in the last 168 hours.  Urinalysis    Component Value Date/Time   COLORURINE YELLOW 02/29/2016 1035   APPEARANCEUR CLEAR 02/29/2016 1035   LABSPEC 1.003 (L) 02/29/2016 1035   PHURINE 6.0 02/29/2016 1035   GLUCOSEU NEGATIVE 02/29/2016 1035   HGBUR SMALL (A) 02/29/2016 1035   BILIRUBINUR NEGATIVE  02/29/2016 1035   Miranda 02/29/2016 1035   PROTEINUR NEGATIVE 02/29/2016 1035   UROBILINOGEN 0.2 06/27/2013 1401   NITRITE NEGATIVE 02/29/2016 1035   LEUKOCYTESUR NEGATIVE 02/29/2016 1035    STUDIES: No results found.    ASSESSMENT: 64 y.o. [place] woman status post right lumpectomy and sentinel lymph node sampling 02/22/2014 for a pT1c pN0,  stage IA invasive ductal carcinoma, grade 1 estrogen receptor 100% positive, progesterone receptor 85% positive, with no HER-2 amplification and an MIB-1 of 12%  (1) OncotypeDX 02/28/2014 score of 8 predicts an outside-the-breast recurrence of 6% if the patient's only systemic treatment is tamoxifen for 5 years. It also predicts no benefit from chemotherapy  (2) completed adjuvant radiation 04/27/2014: 4250 cGy to the right breast with a 1000 cGy boost to the scar  (3) started tamoxifen January 2016--plan is to continue for total of 10 years    PLAN: Jalayah did not show for her 01/23/2020 visit.  A follow-up letter has been sent  Thersa Mohiuddin, Virgie Dad, MD  01/23/20 6:23 PM Medical Oncology and Hematology [facility] [place], [place] [address] Tel. [phone]    Fax. [phone]   I, Wilburn Mylar, am acting as scribe for Dr. Virgie Dad. Chandria Rookstool.    *Total Encounter Time as defined by the Centers for Medicare and Medicaid Services includes, in addition to the face-to-face time of a patient visit (documented in the note above) non-face-to-face time: obtaining and reviewing outside history, ordering and reviewing medications, tests or procedures, care coordination (communications with other health care professionals or caregivers) and documentation in the medical record.

## 2020-01-23 ENCOUNTER — Inpatient Hospital Stay: Payer: 59 | Attending: Oncology | Admitting: Oncology

## 2020-01-23 ENCOUNTER — Encounter: Payer: Self-pay | Admitting: Oncology

## 2020-01-23 DIAGNOSIS — C50211 Malignant neoplasm of upper-inner quadrant of right female breast: Secondary | ICD-10-CM

## 2020-01-23 DIAGNOSIS — Z170 Estrogen receptor positive status [ER+]: Secondary | ICD-10-CM

## 2020-01-24 ENCOUNTER — Telehealth: Payer: Self-pay | Admitting: Oncology

## 2020-01-24 MED FILL — PROPAFENONE HCL ER 225 MG C: 225 | 90 days supply | Qty: 180 | Fill #1

## 2020-01-24 NOTE — Telephone Encounter (Signed)
Called pt per 8/30 schmsg - no answer. Left message for patient to call back for a reschedule

## 2020-01-25 DIAGNOSIS — G4733 Obstructive sleep apnea (adult) (pediatric): Secondary | ICD-10-CM | POA: Diagnosis not present

## 2020-01-25 MED FILL — LEVOTHYROXINE 50 MCG TABLET: 50 | 30 days supply | Qty: 30 | Fill #0

## 2020-02-06 MED FILL — ALPRAZolam 0.25 MG TABS: 0.25 | 30 days supply | Qty: 30 | Fill #3

## 2020-02-06 MED FILL — ZOLPIDEM TARTRATE 10 MG TAB: 10 | 30 days supply | Qty: 30 | Fill #1

## 2020-02-08 DIAGNOSIS — E039 Hypothyroidism, unspecified: Secondary | ICD-10-CM | POA: Diagnosis not present

## 2020-02-14 ENCOUNTER — Other Ambulatory Visit (HOSPITAL_COMMUNITY): Payer: Self-pay | Admitting: Internal Medicine

## 2020-02-14 DIAGNOSIS — R030 Elevated blood-pressure reading, without diagnosis of hypertension: Secondary | ICD-10-CM | POA: Diagnosis not present

## 2020-02-14 DIAGNOSIS — E039 Hypothyroidism, unspecified: Secondary | ICD-10-CM | POA: Diagnosis not present

## 2020-02-14 DIAGNOSIS — Z6831 Body mass index (BMI) 31.0-31.9, adult: Secondary | ICD-10-CM | POA: Diagnosis not present

## 2020-02-14 DIAGNOSIS — I471 Supraventricular tachycardia: Secondary | ICD-10-CM | POA: Diagnosis not present

## 2020-02-14 DIAGNOSIS — E782 Mixed hyperlipidemia: Secondary | ICD-10-CM | POA: Diagnosis not present

## 2020-02-14 DIAGNOSIS — G4733 Obstructive sleep apnea (adult) (pediatric): Secondary | ICD-10-CM | POA: Diagnosis not present

## 2020-02-14 DIAGNOSIS — Z853 Personal history of malignant neoplasm of breast: Secondary | ICD-10-CM | POA: Diagnosis not present

## 2020-02-14 DIAGNOSIS — F419 Anxiety disorder, unspecified: Secondary | ICD-10-CM | POA: Diagnosis not present

## 2020-02-14 MED FILL — busPIRone HCL 7.5 MG TABS: 7.5 | 30 days supply | Qty: 60 | Fill #0

## 2020-02-26 NOTE — Progress Notes (Signed)
Pamela Underwood  [phone] [phone]     ID: Pamela Underwood DOB: 1956/01/17  MR#: [id number]  KAJ#:[id number]  Patient Care Team: Audley Hose, MD as PCP - General (Internal Medicine) Jettie Booze, MD as PCP - Cardiology (Cardiology) Jettie Booze, MD as Consulting Physician (Cardiology) Everlene Farrier, MD as Consulting Physician (Obstetrics and Gynecology) Verdelle Valtierra, Virgie Dad, MD as Consulting Physician (Oncology) Rolm Bookbinder, MD as Consulting Physician (General Surgery) Arloa Koh, MD (Inactive) as Consulting Physician (Radiation Oncology) OTHER MD: Rolm Bookbinder M.D., Arloa Koh M.D.   CHIEF COMPLAINT: Estrogen receptor positive breast cancer  CURRENT TREATMENT: Completing 5 years of tamoxifen   INTERVAL HISTORY: Pamela Underwood returns today for follow-up of her estrogen receptor positive breast cancer.    She continues on tamoxifen.  She has some hot flashes but she tells me she has been having them for 12 years and it is not clear that tamoxifen makes them any worse.  She does not have vaginal discharge issues  Her most recent mammogram at [facility] 05/30/2019 showed the breast density to be category B.  There was no evidence of malignancy.  There were changes consistent with her prior lumpectomy in the upper outer posterior right breast, unchanged.   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  They are quite busy over there.  In addition her 29 year old mother-in-law lives with them so when Pamela Underwood gets home she has to do a little bit more work there as well.  She has received both doses of the Pfizer vaccine and is debating the booster.  She is expecting a grandson.  A detailed review of systems today was otherwise stable   BREAST CANCER HISTORY: From the original intake note:  Pamela Underwood had routine screening mammography at [facility] 02/03/2014. This showed a focal asymmetry in the  right breast and additional views on 02/06/2014 showed an irregular mass in the right breast, 11:00 position. Ultrasound the same day showed this to be 1.1 cm, with irregular margins, and hypoechoic. There was also an oval lymph nodes with focal cortical thickening in the right axilla. There was also, finally, a 1.1 cm oval mass with circumscribed margins in the right breast upper inner quadrant. Color flow imaging there showed no increase in vascularity.biopsy of the upper outer quadrant right breast mass showed invasive ductal carcinoma. The upper inner quadrant right breast lesion was a fibroadenoma versus a benign phyllodes tumor. The right axillary lymph node that was suspicious was negative for metastatic disease (results are taken from MRI dictation, as the pathology report is not in EPIC).  On 02/08/2014 the patient underwent bilateral breast MRIs. This showed, in the upper outer right breast an irregular enhancing mass measuring 1.4 cm. There were mildly prominent right axillary lymph nodes and bilateral nonspecific internal mammary lymph nodes measuring up to 4 mm.  On 02/22/2014 the patient underwent right lumpectomy for the upper inner quadrant lesion which proved to be a fibroadenoma. On the same day right upper outer quadrant lumpectomy showed an invasive ductal carcinoma measuring 1.6 cm, in the setting of ductal carcinoma in situ. The invasive tumor was less than a millimeter from the anterior margin. Both sentinel lymph nodes were benign.  The patient's subsequent history is as detailed below    PAST MEDICAL HISTORY: Past Medical History:  Diagnosis Date  . Arthritis   . Atrial tachycardia ([place])   . Back pain    occasionally  . Breast cancer ([place]) 02/22/14  right upper inner  . Chronic anxiety    takes Xanax daily as needed when heart rate going up  . Colitis    hx-one  . Hyperlipidemia   . Insomnia    takes Ambien nightly as needed  . Overweight (BMI 25.0-29.9) 12/10/2016    . PSVT (paroxysmal supraventricular tachycardia) ([place])   . S/P radiation therapy 03/28/2014 through 04/27/2014                                03/28/2014 through 04/27/2014                                                                         Right breast 4250 cGy 17 sessions, right breast boost 1000 cGy in 4 sessions (hypofractionated)                         . Sleep apnea    uses a CPAP    PAST SURGICAL HISTORY: Past Surgical History:  Procedure Laterality Date  . BREAST BIOPSY Left 05/02/1999   benign  . BREAST LUMPECTOMY WITH AXILLARY LYMPH NODE BIOPSY Right 02/22/14   upper inner  . COLONOSCOPY    . CYSTOSCOPY    . KNEE ARTHROSCOPY WITH MEDIAL MENISECTOMY Left 03/31/2013   Procedure: LEFT KNEE ARTHROSCOPY WITH PARTIAL MEDIAL MENISECTOMY, CHONDROPLASTY OF PATELLA  FEMORAL JOINT, EXCISION OF MEDIAL PLICA, PARTIAL LATERAL MENISECTOMY;  Surgeon: [name], MD;  Location: [place];  Service: Orthopedics;  Laterality: Left;  . TOTAL KNEE ARTHROPLASTY Left 06/29/2013   Procedure: TOTAL KNEE ARTHROPLASTY;  Surgeon: [name], MD;  Location: [place];  Service: Orthopedics;  Laterality: Left;  . TOTAL KNEE ARTHROPLASTY Right 03/12/2016  . TOTAL KNEE ARTHROPLASTY Right 03/12/2016   Procedure: RIGHT TOTAL KNEE ARTHROPLASTY;  Surgeon: [name], MD;  Location: [place];  Service: Orthopedics;  Laterality: Right;  . TUBAL LIGATION      FAMILY HISTORY Family History  Problem Relation Age of Onset  . Heart disease Father   . Hypertension Father   . Diabetes Father   . Heart attack Neg Hx    the patient's father died from a myocardial infarction in his 61s. The patient's mother also died in her 25s, after her an accidental fire exposure. The patient had one sister, no brothers. There is no history of breast or ovarian cancer in the family   GYNECOLOGIC HISTORY:  No LMP recorded. Patient is postmenopausal. Menarche age 28, first live birth age 32, the patient is  GX P3. She stopped having periods at the age of 74. She did not take hormone replacement.   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 Her husband Pamela Underwood is vice Radio producer for a [employer]. Daughter Pamela Underwood works as a Marine scientist in [place]. Daughter Pamela Underwood teaches kindergarten in [place]. Son Pamela Underwood is Facilities manager in [place]. The patient is expecting her first grandchild. She attends a local [place] DIRECTIVES: In the absence of any documents to the contrary the patient's husband is her healthcare power of attorney   HEALTH MAINTENANCE: Social  History   Tobacco Use  . Smoking status: Never Smoker  . Smokeless tobacco: Never Used  Substance Use Topics  . Alcohol use: Yes    Comment: socailly  . Drug use: No     Colonoscopy:  PAP:  Bone density:[facility] 05/15/2014 -- normal  Lipid panel:  Allergies  Allergen Reactions  . Escitalopram Oxalate Other (See Comments)    Hives, welts, all over skin rash  . Cardizem  [Diltiazem Hcl] Hives    Current Outpatient Medications  Medication Sig Dispense Refill  . ALPRAZolam (XANAX) 0.25 MG tablet Take 0.25 mg by mouth daily as needed for anxiety.   4  . aspirin 81 MG tablet Take 81 mg by mouth daily.    . celecoxib (CELEBREX) 200 MG capsule Take 200 mg by mouth daily.    Marland Kitchen levothyroxine (SYNTHROID, LEVOTHROID) 50 MCG tablet Take 50 mcg by mouth daily.    . metoprolol succinate (TOPROL-XL) 50 MG 24 hr tablet TAKE 1 TABLET BY MOUTH DAILY WITH OR IMMEDIATELY FOLLOWING A MEAL 90 tablet 3  . propafenone (RYTHMOL SR) 225 MG 12 hr capsule TAKE 1 CAPSULE BY MOUTH EVERY 12 HOURS 180 capsule 3  . rosuvastatin (CRESTOR) 5 MG tablet TAKE 1 TABLET BY MOUTH DAILY. 90 tablet 0  . tamoxifen (NOLVADEX) 20 MG tablet TAKE 1 TABLET (20 MG TOTAL) BY MOUTH DAILY. 90 tablet 0  . VITAMIN D, CHOLECALCIFEROL, PO Take 1,000 Units by mouth daily.     Marland Kitchen zolpidem (AMBIEN) 10 MG tablet Take 10 mg by mouth at  bedtime as needed for sleep.      No current facility-administered medications for this visit.    OBJECTIVE: White woman who appears younger than stated age Vitals:   02/27/20 1625  BP: 139/88  Pulse: (!) 109  Resp: 18  Temp: 97.7 F (36.5 C)  SpO2: 97%     Body mass index is 31.28 kg/m.    ECOG FS:  Sclerae unicteric, EOMs intact Wearing a mask No cervical or supraclavicular adenopathy Lungs no rales or rhonchi Heart regular rate and rhythm Abd soft, nontender, positive bowel sounds MSK no focal spinal tenderness, no upper extremity lymphedema Neuro: nonfocal, well oriented, appropriate affect Breasts: The right breast is status post lumpectomy and radiation.  There is no evidence of local recurrence.  The left breast is benign.  Both axillae are benign.   LAB RESULTS:  CMP     Component Value Date/Time   NA 141 07/13/2018 1620   NA 140 06/06/2015 1556   K 4.5 07/13/2018 1620   K 4.4 06/06/2015 1556   CL 104 07/13/2018 1620   CO2 23 07/13/2018 1620   CO2 24 06/06/2015 1556   GLUCOSE 91 07/13/2018 1620   GLUCOSE 121 (H) 03/14/2016 0613   GLUCOSE 105 06/06/2015 1556   BUN 19 07/13/2018 1620   BUN 12.7 06/06/2015 1556   CREATININE 0.78 07/13/2018 1620   CREATININE 0.9 06/06/2015 1556   CALCIUM 8.5 (L) 07/13/2018 1620   CALCIUM 8.9 06/06/2015 1556   PROT 6.7 02/29/2016 1030   PROT 6.9 06/06/2015 1556   ALBUMIN 4.2 02/29/2016 1030   ALBUMIN 4.0 06/06/2015 1556   AST 23 02/29/2016 1030   AST 20 06/06/2015 1556   ALT 20 02/29/2016 1030   ALT 21 06/06/2015 1556   ALKPHOS 53 02/29/2016 1030   ALKPHOS 64 06/06/2015 1556   BILITOT 0.2 (L) 02/29/2016 1030   BILITOT 0.47 06/06/2015 1556   GFRNONAA 82 07/13/2018 1620   GFRAA  95 07/13/2018 1620    I No results found for: SPEP  Lab Results  Component Value Date   WBC 12.7 (H) 03/14/2016   NEUTROABS 3.5 02/29/2016   HGB 10.6 (L) 03/14/2016   HCT 31.8 (L) 03/14/2016   MCV 95.2 03/14/2016   PLT 146 (L)  03/14/2016      Chemistry      Component Value Date/Time   NA 141 07/13/2018 1620   NA 140 06/06/2015 1556   K 4.5 07/13/2018 1620   K 4.4 06/06/2015 1556   CL 104 07/13/2018 1620   CO2 23 07/13/2018 1620   CO2 24 06/06/2015 1556   BUN 19 07/13/2018 1620   BUN 12.7 06/06/2015 1556   CREATININE 0.78 07/13/2018 1620   CREATININE 0.9 06/06/2015 1556      Component Value Date/Time   CALCIUM 8.5 (L) 07/13/2018 1620   CALCIUM 8.9 06/06/2015 1556   ALKPHOS 53 02/29/2016 1030   ALKPHOS 64 06/06/2015 1556   AST 23 02/29/2016 1030   AST 20 06/06/2015 1556   ALT 20 02/29/2016 1030   ALT 21 06/06/2015 1556   BILITOT 0.2 (L) 02/29/2016 1030   BILITOT 0.47 06/06/2015 1556       No results found for: LABCA2  No components found for: LABCA125  No results for input(s): INR in the last 168 hours.  Urinalysis    Component Value Date/Time   COLORURINE YELLOW 02/29/2016 1035   APPEARANCEUR CLEAR 02/29/2016 1035   LABSPEC 1.003 (L) 02/29/2016 1035   PHURINE 6.0 02/29/2016 1035   GLUCOSEU NEGATIVE 02/29/2016 1035   HGBUR SMALL (A) 02/29/2016 1035   BILIRUBINUR NEGATIVE 02/29/2016 1035   KETONESUR NEGATIVE 02/29/2016 1035   PROTEINUR NEGATIVE 02/29/2016 1035   UROBILINOGEN 0.2 06/27/2013 1401   NITRITE NEGATIVE 02/29/2016 1035   LEUKOCYTESUR NEGATIVE 02/29/2016 1035    STUDIES: No results found.    ASSESSMENT: 64 y.o. [place] woman status post right lumpectomy and sentinel lymph node sampling 02/22/2014 for a pT1c pN0, stage IA invasive ductal carcinoma, grade 1 estrogen receptor 100% positive, progesterone receptor 85% positive, with no HER-2 amplification and an MIB-1 of 12%  (1) OncotypeDX 02/28/2014 score of 8 predicts an outside-the-breast recurrence of 6% if the patient's only systemic treatment is tamoxifen for 5 years. It also predicts no benefit from chemotherapy  (2) completed adjuvant radiation 04/27/2014: 4250 cGy to the right breast with a 1000 cGy boost to the  scar  (3) started tamoxifen January 2016--completing nearly 5 years as of October 2021    PLAN: Pamela Underwood is now 6 years out from definitive surgery for her breast cancer with no evidence of disease recurrence.  This is very favorable.  She has completed 5 years of tamoxifen.  The question is whether continuing tamoxifen really would be of much use.  As far as her original breast cancer recurring, the Oncotype predicted a 6% risk if she took an antiestrogen for 5 years which she has done.  However 5 years have passed so the risk of recurrence is probably half of 6% or say 3%.  If she took tamoxifen another 5 years she might be able to drop that perhaps by 1 or 2% at the most.  In terms of prevention of another breast cancer occurring in either breast in the future the standard of care is 5 years and that cut that risk in half.  Accordingly at this point I am comfortable with her discontinuing tamoxifen.  She actually ran out 5 days ago.  She understands it will be 3 to 4 months before tamoxifen is really "out of her system".  At that time she can decide if there is any significant symptomatic difference--perhaps there will be fewer hot flashes.  Accordingly she is being released to her primary care physicians follow-up.  As far as breast cancer screening is concerned all she will need is her yearly mammography and a yearly physician breast exam  I will be glad to see Pamela Underwood again at any point in the future if and when the need arises--she knows we are available to her--but as of now are making no further routine appointments for her here.   Pamela Underwood, Virgie Dad, MD  02/27/20 5:10 PM Medical Oncology and Hematology [facility] [place], [place] [address] Tel. [phone]    Fax. [phone]   I, Wilburn Mylar, am acting as scribe for Dr. Virgie Dad. Angie Piercey.  I, Lurline Del MD, have reviewed the above documentation for accuracy and completeness, and I agree  with the above.    *Total Encounter Time as defined by the Centers for Medicare and Medicaid Services includes, in addition to the face-to-face time of a patient visit (documented in the note above) non-face-to-face time: obtaining and reviewing outside history, ordering and reviewing medications, tests or procedures, care coordination (communications with other health care professionals or caregivers) and documentation in the medical record.

## 2020-02-27 ENCOUNTER — Inpatient Hospital Stay: Payer: 59 | Attending: Oncology | Admitting: Oncology

## 2020-02-27 ENCOUNTER — Other Ambulatory Visit: Payer: Self-pay

## 2020-02-27 VITALS — BP 139/88 | HR 109 | Temp 97.7°F | Resp 18 | Ht 67.0 in | Wt 199.7 lb

## 2020-02-27 DIAGNOSIS — Z791 Long term (current) use of non-steroidal anti-inflammatories (NSAID): Secondary | ICD-10-CM | POA: Diagnosis not present

## 2020-02-27 DIAGNOSIS — M1990 Unspecified osteoarthritis, unspecified site: Secondary | ICD-10-CM | POA: Insufficient documentation

## 2020-02-27 DIAGNOSIS — C50411 Malignant neoplasm of upper-outer quadrant of right female breast: Secondary | ICD-10-CM | POA: Diagnosis not present

## 2020-02-27 DIAGNOSIS — Z79899 Other long term (current) drug therapy: Secondary | ICD-10-CM | POA: Diagnosis not present

## 2020-02-27 DIAGNOSIS — Z7982 Long term (current) use of aspirin: Secondary | ICD-10-CM | POA: Insufficient documentation

## 2020-02-27 DIAGNOSIS — I471 Supraventricular tachycardia: Secondary | ICD-10-CM | POA: Diagnosis not present

## 2020-02-27 DIAGNOSIS — C50211 Malignant neoplasm of upper-inner quadrant of right female breast: Secondary | ICD-10-CM

## 2020-02-27 DIAGNOSIS — Z170 Estrogen receptor positive status [ER+]: Secondary | ICD-10-CM | POA: Diagnosis not present

## 2020-02-27 DIAGNOSIS — E785 Hyperlipidemia, unspecified: Secondary | ICD-10-CM | POA: Diagnosis not present

## 2020-02-27 DIAGNOSIS — Z79810 Long term (current) use of selective estrogen receptor modulators (SERMs): Secondary | ICD-10-CM | POA: Insufficient documentation

## 2020-02-27 DIAGNOSIS — Z923 Personal history of irradiation: Secondary | ICD-10-CM | POA: Diagnosis not present

## 2020-02-27 DIAGNOSIS — F419 Anxiety disorder, unspecified: Secondary | ICD-10-CM | POA: Insufficient documentation

## 2020-02-29 ENCOUNTER — Other Ambulatory Visit: Payer: Self-pay | Admitting: Oncology

## 2020-02-29 ENCOUNTER — Other Ambulatory Visit (HOSPITAL_COMMUNITY): Payer: Self-pay | Admitting: Internal Medicine

## 2020-02-29 MED FILL — LEVOTHYROXINE 50 MCG TABLET: 50 | 30 days supply | Qty: 30 | Fill #0

## 2020-02-29 MED FILL — ROSUVASTATIN CALCIUM 5 MG T: 5 | 90 days supply | Qty: 90 | Fill #0

## 2020-03-07 MED FILL — ZOLPIDEM TARTRATE 10 MG TAB: 10 | 30 days supply | Qty: 30 | Fill #2

## 2020-03-08 ENCOUNTER — Other Ambulatory Visit (HOSPITAL_COMMUNITY): Payer: Self-pay | Admitting: Obstetrics and Gynecology

## 2020-03-08 MED FILL — ALPRAZolam 0.25 MG TABS: 0.25 | 30 days supply | Qty: 30 | Fill #0

## 2020-03-12 ENCOUNTER — Telehealth: Payer: Self-pay | Admitting: Interventional Cardiology

## 2020-03-12 NOTE — Telephone Encounter (Signed)
Called and spoke to the patient. She states that over the weekend she has had a lot of breakthrough tachycardia 109-118 bpm. Denies having any chest pain, SOB, or any other Sx. She states that only reason she knew that her HR was elevated was that she saw it on her Apple watch. BP 132/88 HR 112 now, still asymptomatic. The patient takes propafenone 225 mg BID and Toprol-XL 50 mg QD. Made the patient aware that she can take an extra 1/2 tablet of the metoprolol QD prn for palpitations (per JV last OV note). Instructed the patient to try that and if she remains tachycardiac or if she develops Sx she is to contact us. She verbalized understanding and thanked for the call.

## 2020-03-12 NOTE — Telephone Encounter (Signed)
Patient following up.

## 2020-03-12 NOTE — Telephone Encounter (Signed)
STAT if HR is under 50 or over 120 (normal HR is 60-100 beats per minute)  1) What is your heart rate? 109 up to 118- started this weekend right now 112  2) Do you have a log of your heart rate readings (document readings)? Wear  A smart watch, which records heart rate  3) Do you have any other symptoms? no

## 2020-03-21 MED FILL — busPIRone HCL 7.5 MG TABS: 7.5 | 30 days supply | Qty: 60 | Fill #1

## 2020-03-27 ENCOUNTER — Other Ambulatory Visit (HOSPITAL_COMMUNITY): Payer: Self-pay | Admitting: Internal Medicine

## 2020-03-27 DIAGNOSIS — E039 Hypothyroidism, unspecified: Secondary | ICD-10-CM | POA: Diagnosis not present

## 2020-03-27 DIAGNOSIS — Z853 Personal history of malignant neoplasm of breast: Secondary | ICD-10-CM | POA: Diagnosis not present

## 2020-03-27 DIAGNOSIS — Z6831 Body mass index (BMI) 31.0-31.9, adult: Secondary | ICD-10-CM | POA: Diagnosis not present

## 2020-03-27 DIAGNOSIS — Z23 Encounter for immunization: Secondary | ICD-10-CM | POA: Diagnosis not present

## 2020-03-27 DIAGNOSIS — E782 Mixed hyperlipidemia: Secondary | ICD-10-CM | POA: Diagnosis not present

## 2020-03-27 DIAGNOSIS — G4733 Obstructive sleep apnea (adult) (pediatric): Secondary | ICD-10-CM | POA: Diagnosis not present

## 2020-03-27 DIAGNOSIS — F419 Anxiety disorder, unspecified: Secondary | ICD-10-CM | POA: Diagnosis not present

## 2020-03-27 DIAGNOSIS — R030 Elevated blood-pressure reading, without diagnosis of hypertension: Secondary | ICD-10-CM | POA: Diagnosis not present

## 2020-03-27 DIAGNOSIS — I471 Supraventricular tachycardia: Secondary | ICD-10-CM | POA: Diagnosis not present

## 2020-03-27 MED FILL — busPIRone HCL 15 MG TABS: 15 | 30 days supply | Qty: 60 | Fill #0

## 2020-04-02 ENCOUNTER — Other Ambulatory Visit: Payer: Self-pay | Admitting: Oncology

## 2020-04-02 ENCOUNTER — Other Ambulatory Visit: Payer: Self-pay | Admitting: Interventional Cardiology

## 2020-04-02 ENCOUNTER — Other Ambulatory Visit: Payer: Self-pay

## 2020-04-02 MED ORDER — METOPROLOL SUCCINATE ER 50 MG PO TB24: ORAL_TABLET | ORAL | 3 refills | Status: DC

## 2020-04-02 MED FILL — ROSUVASTATIN CALCIUM 5 MG T: 5 | 30 days supply | Qty: 30 | Fill #0

## 2020-04-02 MED FILL — LEVOTHYROXINE 50 MCG TABLET: 50 | 30 days supply | Qty: 30 | Fill #0

## 2020-04-02 MED FILL — METOPROLOL SUCCINATE ER 50: 50 | 75 days supply | Qty: 90 | Fill #0

## 2020-04-02 NOTE — Telephone Encounter (Signed)
[facility] is requesting a new Rx for Metoprolol. Pt stated that she was told that she could take 1 1/2 tablet daily. This change is not on pt's medication list. Please clarify if pt medication was changed. Please address

## 2020-04-02 NOTE — Telephone Encounter (Signed)
Per last OV note patient may take an extra 1/2 tablet prn palpitations. Rx sent in.

## 2020-04-06 MED FILL — ZOLPIDEM TARTRATE 10 MG TAB: 10 | 30 days supply | Qty: 30 | Fill #3

## 2020-04-06 MED FILL — ALPRAZolam 0.25 MG TABS: 0.25 | 30 days supply | Qty: 30 | Fill #1

## 2020-04-23 DIAGNOSIS — G4733 Obstructive sleep apnea (adult) (pediatric): Secondary | ICD-10-CM | POA: Diagnosis not present

## 2020-04-23 MED FILL — PROPAFENONE HCL ER 225 MG C: 225 | 90 days supply | Qty: 180 | Fill #2

## 2020-05-08 ENCOUNTER — Other Ambulatory Visit (HOSPITAL_COMMUNITY): Payer: Self-pay | Admitting: Obstetrics and Gynecology

## 2020-05-09 ENCOUNTER — Other Ambulatory Visit (HOSPITAL_COMMUNITY): Payer: Self-pay | Admitting: Internal Medicine

## 2020-05-09 MED FILL — ALPRAZolam 0.25 MG TABS: 0.25 | 30 days supply | Qty: 30 | Fill #0

## 2020-05-09 MED FILL — LEVOTHYROXINE 50 MCG TABLET: 50 | 30 days supply | Qty: 30 | Fill #0

## 2020-05-09 MED FILL — ZOLPIDEM TARTRATE 10 MG TAB: 10 | 30 days supply | Qty: 30 | Fill #0

## 2020-05-29 MED FILL — ROSUVASTATIN CALCIUM 5 MG T: 5 | 60 days supply | Qty: 60 | Fill #1

## 2020-06-07 MED FILL — METOPROLOL SUCCINATE ER 50: 50 | 75 days supply | Qty: 90 | Fill #1

## 2020-06-12 ENCOUNTER — Other Ambulatory Visit (HOSPITAL_COMMUNITY): Payer: Self-pay | Admitting: Obstetrics and Gynecology

## 2020-06-12 MED FILL — ZOLPIDEM TARTRATE 10 MG TAB: 10 | 30 days supply | Qty: 30 | Fill #0

## 2020-06-12 MED FILL — ALPRAZolam 0.25 MG TABS: 0.25 | 30 days supply | Qty: 30 | Fill #0

## 2020-06-13 ENCOUNTER — Other Ambulatory Visit (HOSPITAL_COMMUNITY): Payer: Self-pay | Admitting: Internal Medicine

## 2020-06-13 MED FILL — LEVOTHYROXINE 50 MCG TABLET: 50 | 30 days supply | Qty: 30 | Fill #0

## 2020-07-12 ENCOUNTER — Other Ambulatory Visit (HOSPITAL_COMMUNITY): Payer: Self-pay | Admitting: Obstetrics and Gynecology

## 2020-07-12 MED FILL — LEVOTHYROXINE 50 MCG TABLET: 50 | 30 days supply | Qty: 30 | Fill #1

## 2020-07-12 MED FILL — ZOLPIDEM TARTRATE 10 MG TAB: 10 | 30 days supply | Qty: 30 | Fill #0

## 2020-07-19 DIAGNOSIS — G4733 Obstructive sleep apnea (adult) (pediatric): Secondary | ICD-10-CM | POA: Diagnosis not present

## 2020-07-20 ENCOUNTER — Other Ambulatory Visit (HOSPITAL_COMMUNITY): Payer: Self-pay | Admitting: Obstetrics and Gynecology

## 2020-07-20 MED FILL — ALPRAZolam 0.25 MG TABS: 0.25 | 30 days supply | Qty: 30 | Fill #0

## 2020-07-23 MED FILL — PROPAFENONE HCL ER 225 MG C: 225 | 90 days supply | Qty: 180 | Fill #3

## 2020-07-25 ENCOUNTER — Other Ambulatory Visit (HOSPITAL_COMMUNITY): Payer: Self-pay | Admitting: Physician Assistant

## 2020-07-25 MED FILL — CELECOXIB 200 MG CAP: 200 | 30 days supply | Qty: 30 | Fill #0

## 2020-07-31 ENCOUNTER — Other Ambulatory Visit (HOSPITAL_COMMUNITY): Payer: Self-pay | Admitting: Internal Medicine

## 2020-07-31 DIAGNOSIS — Z136 Encounter for screening for cardiovascular disorders: Secondary | ICD-10-CM | POA: Diagnosis not present

## 2020-07-31 DIAGNOSIS — Z124 Encounter for screening for malignant neoplasm of cervix: Secondary | ICD-10-CM | POA: Diagnosis not present

## 2020-07-31 DIAGNOSIS — Z23 Encounter for immunization: Secondary | ICD-10-CM | POA: Diagnosis not present

## 2020-07-31 DIAGNOSIS — Z1211 Encounter for screening for malignant neoplasm of colon: Secondary | ICD-10-CM | POA: Diagnosis not present

## 2020-07-31 DIAGNOSIS — Z0001 Encounter for general adult medical examination with abnormal findings: Secondary | ICD-10-CM | POA: Diagnosis not present

## 2020-07-31 DIAGNOSIS — Z1159 Encounter for screening for other viral diseases: Secondary | ICD-10-CM | POA: Diagnosis not present

## 2020-07-31 DIAGNOSIS — Z1239 Encounter for other screening for malignant neoplasm of breast: Secondary | ICD-10-CM | POA: Diagnosis not present

## 2020-07-31 DIAGNOSIS — R739 Hyperglycemia, unspecified: Secondary | ICD-10-CM | POA: Diagnosis not present

## 2020-07-31 DIAGNOSIS — R3129 Other microscopic hematuria: Secondary | ICD-10-CM | POA: Diagnosis not present

## 2020-07-31 DIAGNOSIS — F329 Major depressive disorder, single episode, unspecified: Secondary | ICD-10-CM | POA: Diagnosis not present

## 2020-07-31 DIAGNOSIS — M545 Low back pain, unspecified: Secondary | ICD-10-CM | POA: Diagnosis not present

## 2020-07-31 DIAGNOSIS — Z1231 Encounter for screening mammogram for malignant neoplasm of breast: Secondary | ICD-10-CM | POA: Diagnosis not present

## 2020-07-31 DIAGNOSIS — E039 Hypothyroidism, unspecified: Secondary | ICD-10-CM | POA: Diagnosis not present

## 2020-08-01 ENCOUNTER — Other Ambulatory Visit (HOSPITAL_COMMUNITY): Payer: Self-pay | Admitting: Internal Medicine

## 2020-08-01 MED FILL — ROSUVASTATIN CALCIUM 10 MG: 10 | 90 days supply | Qty: 90 | Fill #0

## 2020-08-01 MED FILL — SHINGRIX 50 MCG SUS: 50 | 1 days supply | Qty: 1 | Fill #0

## 2020-08-13 ENCOUNTER — Other Ambulatory Visit (HOSPITAL_COMMUNITY): Payer: Self-pay | Admitting: Obstetrics and Gynecology

## 2020-08-13 MED FILL — ZOLPIDEM TARTRATE 10 MG TAB: 10 | 30 days supply | Qty: 30 | Fill #0

## 2020-08-17 MED FILL — METOPROLOL SUCCINATE ER 50: 50 | 60 days supply | Qty: 90 | Fill #2

## 2020-08-22 ENCOUNTER — Other Ambulatory Visit (HOSPITAL_COMMUNITY): Payer: Self-pay | Admitting: Obstetrics and Gynecology

## 2020-08-22 DIAGNOSIS — Z01419 Encounter for gynecological examination (general) (routine) without abnormal findings: Secondary | ICD-10-CM | POA: Diagnosis not present

## 2020-08-22 DIAGNOSIS — F419 Anxiety disorder, unspecified: Secondary | ICD-10-CM | POA: Diagnosis not present

## 2020-08-22 DIAGNOSIS — Z6833 Body mass index (BMI) 33.0-33.9, adult: Secondary | ICD-10-CM | POA: Diagnosis not present

## 2020-08-22 DIAGNOSIS — F5104 Psychophysiologic insomnia: Secondary | ICD-10-CM | POA: Diagnosis not present

## 2020-08-31 ENCOUNTER — Other Ambulatory Visit (HOSPITAL_COMMUNITY): Payer: Self-pay

## 2020-08-31 MED FILL — Alprazolam Tab 0.25 MG: ORAL | 30 days supply | Qty: 30 | Fill #0 | Status: AC

## 2020-09-01 ENCOUNTER — Other Ambulatory Visit (HOSPITAL_COMMUNITY): Payer: Self-pay | Admitting: Physician Assistant

## 2020-09-03 NOTE — Progress Notes (Signed)
Cardiology Office Note   Date:  09/04/2020   ID:  OSCAR FORMAN, DOB 1955/08/24, MRN [id number]  PCP:  Audley Hose, MD    No chief complaint on file.  PSVT  Wt Readings from Last 3 Encounters:  09/04/20 206 lb 6.4 oz (93.6 kg)  02/27/20 199 lb 11.2 oz (90.6 kg)  09/08/19 198 lb 6.4 oz (90 kg)       History of Present Illness: Pamela Underwood is a 65 y.o. female  who has had SVT. Controlled on propafenone and diltiazem. She has declined evaluation for ablation in the past. She had breast cancer treated with surgery and radiation. She had no SVT with that process.  She had a TKR in October of 2017. Follws up with Dr. Jana Hakim for her Breast cancer history.  She had a reaction to diltiazem and was swtiched to toprol with decrease in SVT.  She had been working as an Haematologist in Programme researcher, broadcasting/film/video.  Noted higher HR, to 105, in 2021.  We told her to take an extra 1/2 tab of metoprolol when needed.  She does this perhaps 2x/week.  It helps bring her HR to the 80s.   Denies : Chest pain. Dizziness. Leg edema. Nitroglycerin use. Orthopnea. Paroxysmal nocturnal dyspnea. Shortness of breath. Syncope.     Past Medical History:  Diagnosis Date  . Arthritis   . Atrial tachycardia ([place])   . Back pain    occasionally  . Breast cancer ([place]) 02/22/14   right upper inner  . Chronic anxiety    takes Xanax daily as needed when heart rate going up  . Colitis    hx-one  . Hyperlipidemia   . Insomnia    takes Ambien nightly as needed  . Overweight (BMI 25.0-29.9) 12/10/2016  . PSVT (paroxysmal supraventricular tachycardia) ([place])   . S/P radiation therapy 03/28/2014 through 04/27/2014                                03/28/2014 through 04/27/2014                                                                         Right breast 4250 cGy 17 sessions, right breast boost 1000 cGy in 4 sessions (hypofractionated)                         . Sleep apnea    uses a CPAP     Past Surgical History:  Procedure Laterality Date  . BREAST BIOPSY Left 05/02/1999   benign  . BREAST LUMPECTOMY WITH AXILLARY LYMPH NODE BIOPSY Right 02/22/14   upper inner  . COLONOSCOPY    . CYSTOSCOPY    . KNEE ARTHROSCOPY WITH MEDIAL MENISECTOMY Left 03/31/2013   Procedure: LEFT KNEE ARTHROSCOPY WITH PARTIAL MEDIAL MENISECTOMY, CHONDROPLASTY OF PATELLA  FEMORAL JOINT, EXCISION OF MEDIAL PLICA, PARTIAL LATERAL MENISECTOMY;  Surgeon: [name], MD;  Location: [place];  Service: Orthopedics;  Laterality: Left;  . TOTAL KNEE ARTHROPLASTY Left 06/29/2013   Procedure: TOTAL KNEE ARTHROPLASTY;  Surgeon: [name], MD;  Location: [place];  Service: Orthopedics;  Laterality: Left;  . TOTAL KNEE ARTHROPLASTY Right 03/12/2016  . TOTAL KNEE ARTHROPLASTY Right 03/12/2016   Procedure: RIGHT TOTAL KNEE ARTHROPLASTY;  Surgeon: [name], MD;  Location: [place];  Service: Orthopedics;  Laterality: Right;  . TUBAL LIGATION       Current Outpatient Medications  Medication Sig Dispense Refill  . ALPRAZolam (XANAX) 0.25 MG tablet Take 0.25 mg by mouth daily as needed for anxiety.   4  . aspirin 81 MG tablet Take 81 mg by mouth daily.    . celecoxib (CELEBREX) 200 MG capsule TAKE 1 CAPSULE BY MOUTH ONCE A DAY WITH FOOD FOR PAIN AND SWELLING 30 capsule 0  . levothyroxine (SYNTHROID) 50 MCG tablet TAKE 1 TABLET BY MOUTH ONCE DAILY BEFORE MEALS 30 tablet 3  . metoprolol succinate (TOPROL-XL) 50 MG 24 hr tablet TAKE 1 TABLET DAILY WITH OR IMMEDIATELY FOLLOWING A MEAL. MAY TAKE AN EXTRA 1/2 TABLET AS NEEDED FOR PALPITATIONS. 90 tablet 3  . propafenone (RYTHMOL SR) 225 MG 12 hr capsule TAKE 1 CAPSULE BY MOUTH EVERY 12 HOURS 180 capsule 3  . rosuvastatin (CRESTOR) 10 MG tablet TAKE 1 TABLET BY MOUTH ONCE DAILY. 90 tablet 1  . VITAMIN D, CHOLECALCIFEROL, PO Take 1,000 Units by mouth daily.     Marland Kitchen zolpidem (AMBIEN) 10 MG tablet Take 10 mg by mouth at bedtime as needed for sleep.      Marland Kitchen Zoster Vaccine Adjuvanted [facility]) injection TO BE ADMINISTERED BY PHARMACIST. (Patient taking differently: TO BE ADMINISTERED BY PHARMACIST) 1 each 1   No current facility-administered medications for this visit.    Allergies:   Escitalopram oxalate and Cardizem  [diltiazem hcl]    Social History:  The patient  reports that she has never smoked. She has never used smokeless tobacco. She reports current alcohol use. She reports that she does not use drugs.   Family History:  The patient's family history includes Diabetes in her father; Heart disease in her father; Hypertension in her father.    ROS:  Please see the history of present illness.   Otherwise, review of systems are positive for back pain.   All other systems are reviewed and negative.    PHYSICAL EXAM: VS:  BP 126/86   Pulse 86   Ht 5\' 7"  (1.702 m)   Wt 206 lb 6.4 oz (93.6 kg)   SpO2 95%   BMI 32.33 kg/m  , BMI Body mass index is 32.33 kg/m. GEN: Well nourished, well developed, in no acute distress  HEENT: normal  Neck: no JVD, carotid bruits, or masses Cardiac: RRR; no murmurs, rubs, or gallops,no edema  Respiratory:  clear to auscultation bilaterally, normal work of breathing GI: soft, nontender, nondistended, + BS MS: no deformity or atrophy  Skin: warm and dry, no rash Neuro:  Strength and sensation are intact Psych: euthymic mood, full affect   EKG:   The ekg ordered today demonstrates NSR, no ST changes   Recent Labs: No results found for requested labs within last 8760 hours.   Lipid Panel    Component Value Date/Time   CHOL 254 (H) 06/12/2014 0837   TRIG 84 06/12/2014 0837   HDL 84 06/12/2014 0837   CHOLHDL 3.0 06/12/2014 0837   VLDL 17 06/12/2014 0837   LDLCALC 153 (H) 06/12/2014 0837     Other studies Reviewed: Additional studies/ records that were reviewed today with results demonstrating: will try to get from PMD, Dr. Maia Petties.   ASSESSMENT AND PLAN:  1.  SVT: No sx of  palpitations. COntinue beta blocker.  2. Hyperlipidemia: Followed with PMD. She added OTC fish oil due to increased TG.  Continue rosuvastatin.  3. Hypothyroidism: Controlled.  4. Elevated blood sugar: High with PMD.  She has tried to exercise more.  Whole food, plant-based diet recommended.   Current medicines are reviewed at length with the patient today.  The patient concerns regarding her medicines were addressed.  The following changes have been made:  No change  Labs/ tests ordered today include:  No orders of the defined types were placed in this encounter.   Recommend 150 minutes/week of aerobic exercise Low fat, low carb, high fiber diet recommended  Disposition:   FU in 1 year   Signed, Larae Grooms, MD  09/04/2020 4:35 PM    [facility] [place], [place], [place]  [address] Phone: [phone]; Fax: [phone]

## 2020-09-04 ENCOUNTER — Other Ambulatory Visit: Payer: Self-pay

## 2020-09-04 ENCOUNTER — Encounter: Payer: Self-pay | Admitting: Interventional Cardiology

## 2020-09-04 ENCOUNTER — Ambulatory Visit: Payer: 59 | Admitting: Interventional Cardiology

## 2020-09-04 VITALS — BP 126/86 | HR 86 | Ht 67.0 in | Wt 206.4 lb

## 2020-09-04 DIAGNOSIS — E039 Hypothyroidism, unspecified: Secondary | ICD-10-CM | POA: Diagnosis not present

## 2020-09-04 DIAGNOSIS — E782 Mixed hyperlipidemia: Secondary | ICD-10-CM | POA: Diagnosis not present

## 2020-09-04 DIAGNOSIS — R739 Hyperglycemia, unspecified: Secondary | ICD-10-CM

## 2020-09-04 DIAGNOSIS — I471 Supraventricular tachycardia: Secondary | ICD-10-CM | POA: Diagnosis not present

## 2020-09-04 NOTE — Patient Instructions (Signed)
Medication Instructions:  Your physician recommends that you continue on your current medications as directed. Please refer to the Current Medication list given to you today.  *If you need a refill on your cardiac medications before your next appointment, please call your pharmacy*   Lab Work: none If you have labs (blood work) drawn today and your tests are completely normal, you will receive your results only by: . MyChart Message (if you have MyChart) OR . A paper copy in the mail If you have any lab test that is abnormal or we need to change your treatment, we will call you to review the results.   Testing/Procedures: none   Follow-Up: At [facility], you and your health needs are our priority.  As part of our continuing mission to provide you with exceptional heart care, we have created designated Provider Care Teams.  These Care Teams include your primary Cardiologist (physician) and Advanced Practice Providers (APPs -  Physician Assistants and Nurse Practitioners) who all work together to provide you with the care you need, when you need it.  We recommend signing up for the patient portal called "MyChart".  Sign up information is provided on this After Visit Summary.  MyChart is used to connect with patients for Virtual Visits (Telemedicine).  Patients are able to view lab/test results, encounter notes, upcoming appointments, etc.  Non-urgent messages can be sent to your provider as well.   To learn more about what you can do with MyChart, go to [email or web address].    Your next appointment:   12 month(s)  The format for your next appointment:   In Person  Provider:   You may see Jayadeep Varanasi, MD or one of the following Advanced Practice Providers on your designated Care Team:    Dayna Dunn, PA-C  Michele Lenze, PA-C    Other Instructions  High-Fiber Eating Plan Fiber, also called dietary fiber, is a type of carbohydrate. It is found foods such as fruits,  vegetables, whole grains, and beans. A high-fiber diet can have many health benefits. Your health care provider may recommend a high-fiber diet to help:  Prevent constipation. Fiber can make your bowel movements more regular.  Lower your cholesterol.  Relieve the following conditions: ? Inflammation of veins in the anus (hemorrhoids). ? Inflammation of specific areas of the digestive tract (uncomplicated diverticulosis). ? A problem of the large intestine, also called the colon, that sometimes causes pain and diarrhea (irritable bowel syndrome, or IBS).  Prevent overeating as part of a weight-loss plan.  Prevent heart disease, type 2 diabetes, and certain cancers. What are tips for following this plan? Reading food labels  Check the nutrition facts label on food products for the amount of dietary fiber. Choose foods that have 5 grams of fiber or more per serving.  The goals for recommended daily fiber intake include: ? Men (age 50 or younger): 34-38 g. ? Men (over age 50): 28-34 g. ? Women (age 50 or younger): 25-28 g. ? Women (over age 50): 22-25 g. Your daily fiber goal is _____________ g.   Shopping  Choose whole fruits and vegetables instead of processed forms, such as apple juice or applesauce.  Choose a wide variety of high-fiber foods such as avocados, lentils, oats, and kidney beans.  Read the nutrition facts label of the foods you choose. Be aware of foods with added fiber. These foods often have high sugar and sodium amounts per serving. Cooking  Use whole-grain flour for baking and cooking.    Cook with brown rice instead of white rice. Meal planning  Start the day with a breakfast that is high in fiber, such as a cereal that contains 5 g of fiber or more per serving.  Eat breads and cereals that are made with whole-grain flour instead of refined flour or white flour.  Eat brown rice, bulgur wheat, or millet instead of white rice.  Use beans in place of meat in  soups, salads, and pasta dishes.  Be sure that half of the grains you eat each day are whole grains. General information  You can get the recommended daily intake of dietary fiber by: ? Eating a variety of fruits, vegetables, grains, nuts, and beans. ? Taking a fiber supplement if you are not able to take in enough fiber in your diet. It is better to get fiber through food than from a supplement.  Gradually increase how much fiber you consume. If you increase your intake of dietary fiber too quickly, you may have bloating, cramping, or gas.  Drink plenty of water to help you digest fiber.  Choose high-fiber snacks, such as berries, raw vegetables, nuts, and popcorn. What foods should I eat? Fruits Berries. Pears. Apples. Oranges. Avocado. Prunes and raisins. Dried figs. Vegetables Sweet potatoes. Spinach. Kale. Artichokes. Cabbage. Broccoli. Cauliflower. Green peas. Carrots. Squash. Grains Whole-grain breads. Multigrain cereal. Oats and oatmeal. Brown rice. Barley. Bulgur wheat. Millet. Quinoa. Bran muffins. Popcorn. Rye wafer crackers. Meats and other proteins Navy beans, kidney beans, and pinto beans. Soybeans. Split peas. Lentils. Nuts and seeds. Dairy Fiber-fortified yogurt. Beverages Fiber-fortified soy milk. Fiber-fortified orange juice. Other foods Fiber bars. The items listed above may not be a complete list of recommended foods and beverages. Contact a dietitian for more information. What foods should I avoid? Fruits Fruit juice. Cooked, strained fruit. Vegetables Fried potatoes. Canned vegetables. Well-cooked vegetables. Grains White bread. Pasta made with refined flour. White rice. Meats and other proteins Fatty cuts of meat. Fried chicken or fried fish. Dairy Milk. Yogurt. Cream cheese. Sour cream. Fats and oils Butters. Beverages Soft drinks. Other foods Cakes and pastries. The items listed above may not be a complete list of foods and beverages to avoid.  Talk with your dietitian about what choices are best for you. Summary  Fiber is a type of carbohydrate. It is found in foods such as fruits, vegetables, whole grains, and beans.  A high-fiber diet has many benefits. It can help to prevent constipation, lower blood cholesterol, aid weight loss, and reduce your risk of heart disease, diabetes, and certain cancers.  Increase your intake of fiber gradually. Increasing fiber too quickly may cause cramping, bloating, and gas. Drink plenty of water while you increase the amount of fiber you consume.  The best sources of fiber include whole fruits and vegetables, whole grains, nuts, seeds, and beans. This information is not intended to replace advice given to you by your health care provider. Make sure you discuss any questions you have with your health care provider. Document Revised: 09/15/2019 Document Reviewed: 09/15/2019 Elsevier Patient Education  2021 Elsevier Inc.   

## 2020-09-07 ENCOUNTER — Other Ambulatory Visit (HOSPITAL_COMMUNITY): Payer: Self-pay

## 2020-09-07 ENCOUNTER — Other Ambulatory Visit (HOSPITAL_COMMUNITY): Payer: Self-pay | Admitting: Physician Assistant

## 2020-09-07 MED ORDER — CELECOXIB 200 MG PO CAPS
200.0000 mg | ORAL_CAPSULE | Freq: Every day | ORAL | 0 refills | Status: DC
  Filled 2020-09-07: qty 30, 30d supply, fill #0

## 2020-09-12 ENCOUNTER — Other Ambulatory Visit (HOSPITAL_COMMUNITY): Payer: Self-pay

## 2020-09-12 ENCOUNTER — Other Ambulatory Visit (HOSPITAL_COMMUNITY): Payer: Self-pay | Admitting: Obstetrics and Gynecology

## 2020-09-13 ENCOUNTER — Other Ambulatory Visit (HOSPITAL_COMMUNITY): Payer: Self-pay

## 2020-09-14 ENCOUNTER — Other Ambulatory Visit (HOSPITAL_COMMUNITY): Payer: Self-pay

## 2020-09-14 MED ORDER — ZOLPIDEM TARTRATE 10 MG PO TABS
10.0000 mg | ORAL_TABLET | Freq: Every evening | ORAL | 3 refills | Status: DC | PRN
  Filled 2020-09-14: qty 30, 30d supply, fill #0
  Filled 2020-10-15: qty 30, 30d supply, fill #1
  Filled 2020-11-15: qty 30, 30d supply, fill #2
  Filled 2020-12-17: qty 30, 30d supply, fill #3

## 2020-09-14 MED ORDER — ALPRAZOLAM 0.25 MG PO TABS
0.2500 mg | ORAL_TABLET | Freq: Every day | ORAL | 2 refills | Status: DC | PRN
  Filled 2020-09-14 – 2020-10-10 (×2): qty 30, 30d supply, fill #0
  Filled 2020-11-15: qty 30, 30d supply, fill #1
  Filled 2021-01-02: qty 30, 30d supply, fill #2

## 2020-09-19 ENCOUNTER — Other Ambulatory Visit (HOSPITAL_COMMUNITY): Payer: Self-pay

## 2020-09-19 MED FILL — Levothyroxine Sodium Tab 50 MCG: ORAL | 60 days supply | Qty: 60 | Fill #0 | Status: AC

## 2020-10-10 ENCOUNTER — Other Ambulatory Visit (HOSPITAL_COMMUNITY): Payer: Self-pay

## 2020-10-15 ENCOUNTER — Other Ambulatory Visit (HOSPITAL_COMMUNITY): Payer: Self-pay

## 2020-10-15 DIAGNOSIS — U071 COVID-19: Secondary | ICD-10-CM | POA: Diagnosis not present

## 2020-10-15 DIAGNOSIS — R051 Acute cough: Secondary | ICD-10-CM | POA: Diagnosis not present

## 2020-10-15 DIAGNOSIS — R000 Tachycardia, unspecified: Secondary | ICD-10-CM | POA: Diagnosis not present

## 2020-10-16 ENCOUNTER — Other Ambulatory Visit (HOSPITAL_COMMUNITY): Payer: Self-pay

## 2020-10-16 MED ORDER — MOLNUPIRAVIR 200 MG PO CAPS
800.0000 mg | ORAL_CAPSULE | Freq: Two times a day (BID) | ORAL | 0 refills | Status: DC
  Filled 2020-10-16: qty 40, 5d supply, fill #0

## 2020-10-16 MED ORDER — BENZONATATE 200 MG PO CAPS
200.0000 mg | ORAL_CAPSULE | Freq: Three times a day (TID) | ORAL | 0 refills | Status: DC
  Filled 2020-10-16: qty 21, 7d supply, fill #0

## 2020-10-18 DIAGNOSIS — G4733 Obstructive sleep apnea (adult) (pediatric): Secondary | ICD-10-CM | POA: Diagnosis not present

## 2020-10-23 ENCOUNTER — Other Ambulatory Visit: Payer: Self-pay | Admitting: Interventional Cardiology

## 2020-10-23 ENCOUNTER — Other Ambulatory Visit (HOSPITAL_COMMUNITY): Payer: Self-pay

## 2020-10-23 MED FILL — Zoster Vac Recombinant Adjuvanted for IM Inj 50 MCG/0.5ML: INTRAMUSCULAR | 1 days supply | Qty: 1 | Fill #0 | Status: CN

## 2020-10-24 ENCOUNTER — Other Ambulatory Visit (HOSPITAL_COMMUNITY): Payer: Self-pay

## 2020-10-24 MED ORDER — PROPAFENONE HCL ER 225 MG PO CP12
225.0000 mg | ORAL_CAPSULE | Freq: Two times a day (BID) | ORAL | 3 refills | Status: DC
  Filled 2020-10-24: qty 180, 90d supply, fill #0
  Filled 2021-01-25: qty 60, 30d supply, fill #1
  Filled 2021-02-26: qty 60, 30d supply, fill #2
  Filled 2021-04-02: qty 60, 30d supply, fill #3
  Filled 2021-05-03: qty 60, 30d supply, fill #4
  Filled 2021-06-03: qty 60, 30d supply, fill #5
  Filled 2021-07-04: qty 60, 30d supply, fill #6
  Filled 2021-07-30: qty 60, 30d supply, fill #7
  Filled 2021-09-05: qty 60, 30d supply, fill #8
  Filled 2021-10-07: qty 60, 30d supply, fill #9

## 2020-10-26 ENCOUNTER — Other Ambulatory Visit (HOSPITAL_COMMUNITY): Payer: Self-pay

## 2020-11-06 ENCOUNTER — Other Ambulatory Visit (HOSPITAL_COMMUNITY): Payer: Self-pay

## 2020-11-06 MED FILL — Metoprolol Succinate Tab ER 24HR 50 MG (Tartrate Equiv): ORAL | 75 days supply | Qty: 90 | Fill #0 | Status: AC

## 2020-11-07 ENCOUNTER — Telehealth (INDEPENDENT_AMBULATORY_CARE_PROVIDER_SITE_OTHER): Payer: 59 | Admitting: Plastic Surgery

## 2020-11-07 ENCOUNTER — Other Ambulatory Visit (HOSPITAL_COMMUNITY): Payer: Self-pay

## 2020-11-07 DIAGNOSIS — Z719 Counseling, unspecified: Secondary | ICD-10-CM

## 2020-11-07 MED ORDER — DOXYCYCLINE HYCLATE 100 MG PO TABS
100.0000 mg | ORAL_TABLET | Freq: Two times a day (BID) | ORAL | 0 refills | Status: AC
  Filled 2020-11-07: qty 14, 7d supply, fill #0

## 2020-11-07 NOTE — Telephone Encounter (Signed)
The patient called with concerns about her right lower lid.  It appears to be a stye.  It has gotten larger and more painful as the day continues.  She thinks it started yesterday.  The periocular area is red, sore and swollen.  Recommend warm compresses, Motrin, J and J baby shampoo rinse and Doxy.  If not better in 1-2 days seek PCP exam.  Patient acknowledges understanding and agreeing with the plan.  She was at work and I was at the hospital.

## 2020-11-08 ENCOUNTER — Other Ambulatory Visit (HOSPITAL_COMMUNITY): Payer: Self-pay

## 2020-11-08 DIAGNOSIS — H0012 Chalazion right lower eyelid: Secondary | ICD-10-CM | POA: Diagnosis not present

## 2020-11-12 ENCOUNTER — Other Ambulatory Visit (HOSPITAL_COMMUNITY): Payer: Self-pay

## 2020-11-14 ENCOUNTER — Other Ambulatory Visit (HOSPITAL_COMMUNITY): Payer: Self-pay

## 2020-11-15 ENCOUNTER — Other Ambulatory Visit (HOSPITAL_COMMUNITY): Payer: Self-pay

## 2020-11-15 MED FILL — Rosuvastatin Calcium Tab 10 MG: ORAL | 90 days supply | Qty: 90 | Fill #0 | Status: AC

## 2020-11-15 MED FILL — Zoster Vac Recombinant Adjuvanted for IM Inj 50 MCG/0.5ML: INTRAMUSCULAR | Qty: 1 | Fill #0 | Status: CN

## 2020-11-16 DIAGNOSIS — H0012 Chalazion right lower eyelid: Secondary | ICD-10-CM | POA: Diagnosis not present

## 2020-11-27 ENCOUNTER — Other Ambulatory Visit (HOSPITAL_COMMUNITY): Payer: Self-pay

## 2020-11-27 MED FILL — Zoster Vac Recombinant Adjuvanted for IM Inj 50 MCG/0.5ML: INTRAMUSCULAR | 1 days supply | Qty: 1 | Fill #0 | Status: CN

## 2020-11-29 ENCOUNTER — Other Ambulatory Visit (HOSPITAL_COMMUNITY): Payer: Self-pay

## 2020-11-29 MED ORDER — LEVOTHYROXINE SODIUM 50 MCG PO TABS
50.0000 ug | ORAL_TABLET | Freq: Every day | ORAL | 1 refills | Status: DC
  Filled 2020-11-29: qty 60, 60d supply, fill #0
  Filled 2021-02-12: qty 60, 60d supply, fill #1

## 2020-12-03 ENCOUNTER — Other Ambulatory Visit (HOSPITAL_COMMUNITY): Payer: Self-pay

## 2020-12-07 ENCOUNTER — Other Ambulatory Visit (HOSPITAL_COMMUNITY): Payer: Self-pay

## 2020-12-07 MED FILL — Zoster Vac Recombinant Adjuvanted for IM Inj 50 MCG/0.5ML: INTRAMUSCULAR | 1 days supply | Qty: 1 | Fill #0 | Status: CN

## 2020-12-17 ENCOUNTER — Other Ambulatory Visit (HOSPITAL_COMMUNITY): Payer: Self-pay

## 2020-12-24 ENCOUNTER — Other Ambulatory Visit (HOSPITAL_COMMUNITY): Payer: Self-pay

## 2021-01-02 ENCOUNTER — Other Ambulatory Visit (HOSPITAL_COMMUNITY): Payer: Self-pay

## 2021-01-02 DIAGNOSIS — R051 Acute cough: Secondary | ICD-10-CM | POA: Diagnosis not present

## 2021-01-02 DIAGNOSIS — U071 COVID-19: Secondary | ICD-10-CM | POA: Diagnosis not present

## 2021-01-02 MED ORDER — MOLNUPIRAVIR 200 MG PO CAPS
ORAL_CAPSULE | ORAL | 0 refills | Status: DC
  Filled 2021-01-02: qty 40, 5d supply, fill #0

## 2021-01-10 DIAGNOSIS — G4733 Obstructive sleep apnea (adult) (pediatric): Secondary | ICD-10-CM | POA: Diagnosis not present

## 2021-01-14 ENCOUNTER — Other Ambulatory Visit (HOSPITAL_COMMUNITY): Payer: Self-pay

## 2021-01-14 MED ORDER — ZOLPIDEM TARTRATE 10 MG PO TABS
10.0000 mg | ORAL_TABLET | Freq: Every evening | ORAL | 3 refills | Status: DC | PRN
  Filled 2021-01-14: qty 30, 30d supply, fill #0
  Filled 2021-02-15: qty 30, 30d supply, fill #1
  Filled 2021-03-18: qty 30, 30d supply, fill #2
  Filled 2021-04-21: qty 30, 30d supply, fill #3

## 2021-01-15 ENCOUNTER — Other Ambulatory Visit (HOSPITAL_COMMUNITY): Payer: Self-pay

## 2021-01-25 ENCOUNTER — Other Ambulatory Visit (HOSPITAL_COMMUNITY): Payer: Self-pay

## 2021-02-04 ENCOUNTER — Other Ambulatory Visit (HOSPITAL_COMMUNITY): Payer: Self-pay

## 2021-02-04 ENCOUNTER — Other Ambulatory Visit: Payer: Self-pay | Admitting: Interventional Cardiology

## 2021-02-05 ENCOUNTER — Other Ambulatory Visit (HOSPITAL_COMMUNITY): Payer: Self-pay

## 2021-02-05 MED ORDER — METOPROLOL SUCCINATE ER 50 MG PO TB24
ORAL_TABLET | ORAL | 2 refills | Status: DC
  Filled 2021-02-05: qty 90, 60d supply, fill #0
  Filled 2021-05-03: qty 90, 60d supply, fill #1
  Filled 2021-07-30: qty 90, 60d supply, fill #2

## 2021-02-12 ENCOUNTER — Other Ambulatory Visit (HOSPITAL_COMMUNITY): Payer: Self-pay

## 2021-02-15 ENCOUNTER — Other Ambulatory Visit (HOSPITAL_COMMUNITY): Payer: Self-pay

## 2021-02-18 ENCOUNTER — Other Ambulatory Visit (HOSPITAL_COMMUNITY): Payer: Self-pay

## 2021-02-18 DIAGNOSIS — I471 Supraventricular tachycardia: Secondary | ICD-10-CM | POA: Diagnosis not present

## 2021-02-18 DIAGNOSIS — Z23 Encounter for immunization: Secondary | ICD-10-CM | POA: Diagnosis not present

## 2021-02-18 DIAGNOSIS — M545 Low back pain, unspecified: Secondary | ICD-10-CM | POA: Diagnosis not present

## 2021-02-18 DIAGNOSIS — E782 Mixed hyperlipidemia: Secondary | ICD-10-CM | POA: Diagnosis not present

## 2021-02-18 DIAGNOSIS — E039 Hypothyroidism, unspecified: Secondary | ICD-10-CM | POA: Diagnosis not present

## 2021-02-18 DIAGNOSIS — G4733 Obstructive sleep apnea (adult) (pediatric): Secondary | ICD-10-CM | POA: Diagnosis not present

## 2021-02-18 DIAGNOSIS — Z853 Personal history of malignant neoplasm of breast: Secondary | ICD-10-CM | POA: Diagnosis not present

## 2021-02-18 MED ORDER — CELECOXIB 200 MG PO CAPS
200.0000 mg | ORAL_CAPSULE | Freq: Every day | ORAL | 0 refills | Status: DC
  Filled 2021-02-18: qty 90, 90d supply, fill #0

## 2021-02-19 ENCOUNTER — Other Ambulatory Visit (HOSPITAL_COMMUNITY): Payer: Self-pay

## 2021-02-19 MED ORDER — ALPRAZOLAM 0.25 MG PO TABS
0.2500 mg | ORAL_TABLET | Freq: Every day | ORAL | 2 refills | Status: DC | PRN
  Filled 2021-02-19: qty 30, 30d supply, fill #0
  Filled 2021-04-02: qty 30, 30d supply, fill #1
  Filled 2021-06-03: qty 30, 30d supply, fill #2

## 2021-02-26 ENCOUNTER — Other Ambulatory Visit (HOSPITAL_COMMUNITY): Payer: Self-pay

## 2021-02-27 ENCOUNTER — Other Ambulatory Visit (HOSPITAL_COMMUNITY): Payer: Self-pay

## 2021-02-27 MED ORDER — ROSUVASTATIN CALCIUM 10 MG PO TABS
10.0000 mg | ORAL_TABLET | Freq: Every day | ORAL | 1 refills | Status: DC
  Filled 2021-02-27: qty 90, 90d supply, fill #0
  Filled 2021-06-03: qty 90, 90d supply, fill #1

## 2021-03-19 ENCOUNTER — Other Ambulatory Visit (HOSPITAL_COMMUNITY): Payer: Self-pay

## 2021-04-02 ENCOUNTER — Other Ambulatory Visit (HOSPITAL_COMMUNITY): Payer: Self-pay

## 2021-04-04 DIAGNOSIS — G4733 Obstructive sleep apnea (adult) (pediatric): Secondary | ICD-10-CM | POA: Diagnosis not present

## 2021-04-21 ENCOUNTER — Other Ambulatory Visit (HOSPITAL_COMMUNITY): Payer: Self-pay

## 2021-04-22 ENCOUNTER — Other Ambulatory Visit (HOSPITAL_COMMUNITY): Payer: Self-pay

## 2021-04-22 MED ORDER — LEVOTHYROXINE SODIUM 50 MCG PO TABS
50.0000 ug | ORAL_TABLET | Freq: Every day | ORAL | 1 refills | Status: DC
  Filled 2021-04-22: qty 60, 60d supply, fill #0
  Filled 2021-07-17: qty 60, 60d supply, fill #1

## 2021-05-03 ENCOUNTER — Other Ambulatory Visit (HOSPITAL_COMMUNITY): Payer: Self-pay

## 2021-05-22 ENCOUNTER — Other Ambulatory Visit (HOSPITAL_COMMUNITY): Payer: Self-pay

## 2021-05-22 MED ORDER — ZOLPIDEM TARTRATE 10 MG PO TABS
10.0000 mg | ORAL_TABLET | Freq: Every evening | ORAL | 2 refills | Status: DC | PRN
  Filled 2021-05-22: qty 30, 30d supply, fill #0
  Filled 2021-06-24: qty 30, 30d supply, fill #1
  Filled 2021-07-24: qty 30, 30d supply, fill #2

## 2021-06-03 ENCOUNTER — Other Ambulatory Visit (HOSPITAL_COMMUNITY): Payer: Self-pay

## 2021-06-11 ENCOUNTER — Other Ambulatory Visit (HOSPITAL_COMMUNITY): Payer: Self-pay

## 2021-06-11 MED ORDER — CELECOXIB 200 MG PO CAPS
200.0000 mg | ORAL_CAPSULE | Freq: Every day | ORAL | 0 refills | Status: DC
  Filled 2021-06-11: qty 90, 90d supply, fill #0

## 2021-06-24 ENCOUNTER — Other Ambulatory Visit (HOSPITAL_COMMUNITY): Payer: Self-pay

## 2021-06-24 ENCOUNTER — Other Ambulatory Visit: Payer: Self-pay

## 2021-06-27 DIAGNOSIS — G4733 Obstructive sleep apnea (adult) (pediatric): Secondary | ICD-10-CM | POA: Diagnosis not present

## 2021-07-05 ENCOUNTER — Other Ambulatory Visit (HOSPITAL_COMMUNITY): Payer: Self-pay

## 2021-07-17 ENCOUNTER — Other Ambulatory Visit (HOSPITAL_COMMUNITY): Payer: Self-pay

## 2021-07-24 ENCOUNTER — Other Ambulatory Visit (HOSPITAL_COMMUNITY): Payer: Self-pay

## 2021-07-30 ENCOUNTER — Other Ambulatory Visit (HOSPITAL_COMMUNITY): Payer: Self-pay

## 2021-08-16 DIAGNOSIS — Z1231 Encounter for screening mammogram for malignant neoplasm of breast: Secondary | ICD-10-CM | POA: Diagnosis not present

## 2021-08-21 ENCOUNTER — Other Ambulatory Visit (HOSPITAL_COMMUNITY): Payer: Self-pay

## 2021-08-21 MED ORDER — ZOLPIDEM TARTRATE 10 MG PO TABS
10.0000 mg | ORAL_TABLET | Freq: Every evening | ORAL | 2 refills | Status: DC | PRN
  Filled 2021-08-21: qty 30, 30d supply, fill #0
  Filled 2021-09-23: qty 30, 30d supply, fill #1
  Filled 2021-10-23: qty 30, 30d supply, fill #2

## 2021-08-26 DIAGNOSIS — Z780 Asymptomatic menopausal state: Secondary | ICD-10-CM | POA: Diagnosis not present

## 2021-08-26 DIAGNOSIS — M85852 Other specified disorders of bone density and structure, left thigh: Secondary | ICD-10-CM | POA: Diagnosis not present

## 2021-08-26 DIAGNOSIS — M85851 Other specified disorders of bone density and structure, right thigh: Secondary | ICD-10-CM | POA: Diagnosis not present

## 2021-09-05 ENCOUNTER — Other Ambulatory Visit (HOSPITAL_COMMUNITY): Payer: Self-pay

## 2021-09-06 ENCOUNTER — Other Ambulatory Visit (HOSPITAL_COMMUNITY): Payer: Self-pay

## 2021-09-06 MED ORDER — ROSUVASTATIN CALCIUM 10 MG PO TABS
10.0000 mg | ORAL_TABLET | Freq: Every day | ORAL | 1 refills | Status: DC
  Filled 2021-09-06: qty 90, 90d supply, fill #0
  Filled 2022-01-15: qty 90, 90d supply, fill #1

## 2021-09-18 DIAGNOSIS — E039 Hypothyroidism, unspecified: Secondary | ICD-10-CM | POA: Diagnosis not present

## 2021-09-18 DIAGNOSIS — E782 Mixed hyperlipidemia: Secondary | ICD-10-CM | POA: Diagnosis not present

## 2021-09-18 DIAGNOSIS — Z0001 Encounter for general adult medical examination with abnormal findings: Secondary | ICD-10-CM | POA: Diagnosis not present

## 2021-09-18 DIAGNOSIS — R739 Hyperglycemia, unspecified: Secondary | ICD-10-CM | POA: Diagnosis not present

## 2021-09-18 DIAGNOSIS — E538 Deficiency of other specified B group vitamins: Secondary | ICD-10-CM | POA: Diagnosis not present

## 2021-09-19 DIAGNOSIS — G4733 Obstructive sleep apnea (adult) (pediatric): Secondary | ICD-10-CM | POA: Diagnosis not present

## 2021-09-23 ENCOUNTER — Other Ambulatory Visit (HOSPITAL_COMMUNITY): Payer: Self-pay

## 2021-09-24 ENCOUNTER — Other Ambulatory Visit (HOSPITAL_COMMUNITY): Payer: Self-pay

## 2021-09-24 MED ORDER — ALPRAZOLAM 0.25 MG PO TABS
0.2500 mg | ORAL_TABLET | Freq: Every day | ORAL | 0 refills | Status: DC | PRN
  Filled 2021-09-24: qty 30, 30d supply, fill #0

## 2021-09-27 ENCOUNTER — Other Ambulatory Visit (HOSPITAL_COMMUNITY): Payer: Self-pay

## 2021-09-27 DIAGNOSIS — E039 Hypothyroidism, unspecified: Secondary | ICD-10-CM | POA: Diagnosis not present

## 2021-09-27 DIAGNOSIS — Z23 Encounter for immunization: Secondary | ICD-10-CM | POA: Diagnosis not present

## 2021-09-27 DIAGNOSIS — Z124 Encounter for screening for malignant neoplasm of cervix: Secondary | ICD-10-CM | POA: Diagnosis not present

## 2021-09-27 DIAGNOSIS — E782 Mixed hyperlipidemia: Secondary | ICD-10-CM | POA: Diagnosis not present

## 2021-09-27 DIAGNOSIS — Z1239 Encounter for other screening for malignant neoplasm of breast: Secondary | ICD-10-CM | POA: Diagnosis not present

## 2021-09-27 DIAGNOSIS — Z0001 Encounter for general adult medical examination with abnormal findings: Secondary | ICD-10-CM | POA: Diagnosis not present

## 2021-09-27 DIAGNOSIS — M545 Low back pain, unspecified: Secondary | ICD-10-CM | POA: Diagnosis not present

## 2021-09-27 DIAGNOSIS — R7303 Prediabetes: Secondary | ICD-10-CM | POA: Diagnosis not present

## 2021-09-27 DIAGNOSIS — Z1211 Encounter for screening for malignant neoplasm of colon: Secondary | ICD-10-CM | POA: Diagnosis not present

## 2021-09-27 MED ORDER — METFORMIN HCL ER 500 MG PO TB24
500.0000 mg | ORAL_TABLET | Freq: Every evening | ORAL | 1 refills | Status: DC
  Filled 2021-09-27: qty 90, 90d supply, fill #0

## 2021-09-27 MED ORDER — GLUCOSE BLOOD VI STRP
ORAL_STRIP | 1 refills | Status: DC
Start: 2021-09-27 — End: 2024-01-14
  Filled 2021-09-27: qty 50, 50d supply, fill #0

## 2021-09-27 MED ORDER — BLOOD GLUCOSE MONITOR KIT
PACK | 0 refills | Status: DC
  Filled 2021-09-27: qty 1, 30d supply, fill #0

## 2021-09-27 MED ORDER — CELECOXIB 100 MG PO CAPS
100.0000 mg | ORAL_CAPSULE | Freq: Every day | ORAL | 0 refills | Status: DC
  Filled 2021-09-27: qty 30, 30d supply, fill #0

## 2021-09-27 MED ORDER — FREESTYLE LANCETS MISC
1 refills | Status: DC
Start: 2021-09-27 — End: 2024-01-14
  Filled 2021-09-27: qty 100, 90d supply, fill #0

## 2021-09-30 DIAGNOSIS — Z6834 Body mass index (BMI) 34.0-34.9, adult: Secondary | ICD-10-CM | POA: Diagnosis not present

## 2021-09-30 DIAGNOSIS — Z01419 Encounter for gynecological examination (general) (routine) without abnormal findings: Secondary | ICD-10-CM | POA: Diagnosis not present

## 2021-09-30 DIAGNOSIS — Z124 Encounter for screening for malignant neoplasm of cervix: Secondary | ICD-10-CM | POA: Diagnosis not present

## 2021-09-30 DIAGNOSIS — N952 Postmenopausal atrophic vaginitis: Secondary | ICD-10-CM | POA: Diagnosis not present

## 2021-10-07 ENCOUNTER — Other Ambulatory Visit (HOSPITAL_COMMUNITY): Payer: Self-pay

## 2021-10-07 DIAGNOSIS — R87611 Atypical squamous cells cannot exclude high grade squamous intraepithelial lesion on cytologic smear of cervix (ASC-H): Secondary | ICD-10-CM | POA: Diagnosis not present

## 2021-10-07 DIAGNOSIS — N72 Inflammatory disease of cervix uteri: Secondary | ICD-10-CM | POA: Diagnosis not present

## 2021-10-07 MED ORDER — LEVOTHYROXINE SODIUM 50 MCG PO TABS
50.0000 ug | ORAL_TABLET | Freq: Every day | ORAL | 1 refills | Status: DC
  Filled 2021-10-07: qty 60, 60d supply, fill #0
  Filled 2021-12-30: qty 60, 60d supply, fill #1

## 2021-10-18 ENCOUNTER — Other Ambulatory Visit (HOSPITAL_COMMUNITY): Payer: Self-pay

## 2021-10-18 ENCOUNTER — Other Ambulatory Visit: Payer: Self-pay | Admitting: Interventional Cardiology

## 2021-10-18 MED ORDER — METOPROLOL SUCCINATE ER 50 MG PO TB24
50.0000 mg | ORAL_TABLET | Freq: Every day | ORAL | 0 refills | Status: DC
Start: 2021-10-18 — End: 2021-10-31
  Filled 2021-10-18: qty 45, 30d supply, fill #0

## 2021-10-23 ENCOUNTER — Other Ambulatory Visit (HOSPITAL_COMMUNITY): Payer: Self-pay

## 2021-10-31 ENCOUNTER — Other Ambulatory Visit (HOSPITAL_COMMUNITY): Payer: Self-pay

## 2021-10-31 ENCOUNTER — Ambulatory Visit: Payer: 59 | Admitting: Physician Assistant

## 2021-10-31 ENCOUNTER — Encounter: Payer: Self-pay | Admitting: Physician Assistant

## 2021-10-31 VITALS — BP 124/90 | HR 97 | Ht 67.0 in | Wt 208.0 lb

## 2021-10-31 DIAGNOSIS — Z9989 Dependence on other enabling machines and devices: Secondary | ICD-10-CM

## 2021-10-31 DIAGNOSIS — R9431 Abnormal electrocardiogram [ECG] [EKG]: Secondary | ICD-10-CM | POA: Diagnosis not present

## 2021-10-31 DIAGNOSIS — G4733 Obstructive sleep apnea (adult) (pediatric): Secondary | ICD-10-CM

## 2021-10-31 DIAGNOSIS — E039 Hypothyroidism, unspecified: Secondary | ICD-10-CM

## 2021-10-31 DIAGNOSIS — E782 Mixed hyperlipidemia: Secondary | ICD-10-CM

## 2021-10-31 DIAGNOSIS — I471 Supraventricular tachycardia: Secondary | ICD-10-CM

## 2021-10-31 MED ORDER — METOPROLOL SUCCINATE ER 50 MG PO TB24
50.0000 mg | ORAL_TABLET | Freq: Every day | ORAL | 2 refills | Status: DC
  Filled 2021-10-31: qty 135, 135d supply, fill #0
  Filled 2021-12-03: qty 135, 90d supply, fill #0
  Filled 2022-04-23: qty 135, 90d supply, fill #1
  Filled 2022-09-05: qty 135, 90d supply, fill #2

## 2021-10-31 MED ORDER — PROPAFENONE HCL ER 225 MG PO CP12
225.0000 mg | ORAL_CAPSULE | Freq: Two times a day (BID) | ORAL | 3 refills | Status: DC
  Filled 2021-10-31: qty 180, 90d supply, fill #0
  Filled 2021-12-03 – 2022-02-05 (×2): qty 180, 90d supply, fill #1
  Filled 2022-02-05: qty 12, 6d supply, fill #1
  Filled 2022-02-05: qty 168, 84d supply, fill #1
  Filled 2022-05-07: qty 60, 30d supply, fill #2
  Filled 2022-06-09: qty 60, 30d supply, fill #3
  Filled 2022-07-07: qty 60, 30d supply, fill #4
  Filled 2022-08-06: qty 60, 30d supply, fill #5
  Filled 2022-09-05: qty 60, 30d supply, fill #6
  Filled 2022-10-07: qty 60, 30d supply, fill #7

## 2021-10-31 NOTE — Patient Instructions (Signed)
Medication Instructions:  Your physician recommends that you continue on your current medications as directed. Please refer to the Current Medication list given to you today.  *If you need a refill on your cardiac medications before your next appointment, please call your pharmacy*   Lab Work: None ordered   If you have labs (blood work) drawn today and your tests are completely normal, you will receive your results only by: [place] (if you have MyChart) OR A paper copy in the mail If you have any lab test that is abnormal or we need to change your treatment, we will call you to review the results.   Testing/Procedures: None ordered    Follow-Up: At [facility], you and your health needs are our priority.  As part of our continuing mission to provide you with exceptional heart care, we have created designated Provider Care Teams.  These Care Teams include your primary Cardiologist (physician) and Advanced Practice Providers (APPs -  Physician Assistants and Nurse Practitioners) who all work together to provide you with the care you need, when you need it.  We recommend signing up for the patient portal called "MyChart".  Sign up information is provided on this After Visit Summary.  MyChart is used to connect with patients for Virtual Visits (Telemedicine).  Patients are able to view lab/test results, encounter notes, upcoming appointments, etc.  Non-urgent messages can be sent to your provider as well.   To learn more about what you can do with MyChart, go to [email or web address].    Your next appointment:   12 month(s)  The format for your next appointment:   In Person  Provider:   Larae Grooms, MD     Other Instructions   Important Information About Sugar

## 2021-10-31 NOTE — Progress Notes (Signed)
Cardiology Office Note:    Date:  10/31/2021   ID:  Pamela Underwood, DOB 04/05/1956, MRN [id number]  PCP:  Audley Hose, MD   [facility] Providers Cardiologist:  Larae Grooms, MD     Referring MD: Audley Hose, MD   1 year follow up.   History of Present Illness:    Pamela Underwood is a 66 y.o. female with a hx of SVT (declined evaluation for ablation in the past), breast cancer s.p sx/xrt, obesity, hypothyroidism, OSA on CPAP and HLD who presents to clinic for follow up.   She had a TKR in October of 2017.  Follws up with Dr. Jana Hakim for her Breast cancer history.   She had a reaction to diltiazem and was swtiched to toprol with decrease in SVT.  She had been working as an Haematologist in Programme researcher, broadcasting/film/video.   Noted higher HR, to 105, in 2021. We told her to take an extra 1/2 tab of metoprolol when needed.  She does this perhaps 2x/week.  It helps bring her HR to the 80s.    Today the patient presents to clinic for follow up. Still takes an extra metoprolol about 1x every three weeks for elevated HRs. Katheran Awe does not show afib. Has lost weight recently due to an elevated hga1c. Feels better after losing 8 lbs. No CP or SOB. No LE edema, orthopnea or PND. No dizziness or syncope. No blood in stool or urine. Labs recently done by PCP.    Past Medical History:  Diagnosis Date   Arthritis    Atrial tachycardia ([place])    Back pain    occasionally   Breast cancer ([place]) 02/22/14   right upper inner   Chronic anxiety    takes Xanax daily as needed when heart rate going up   Colitis    hx-one   Hyperlipidemia    Insomnia    takes Ambien nightly as needed   Overweight (BMI 25.0-29.9) 12/10/2016   PSVT (paroxysmal supraventricular tachycardia) ([facility])    S/P radiation therapy 03/28/2014 through 04/27/2014                                03/28/2014 through 04/27/2014                                                                         Right breast 4250 cGy 17 sessions,  right breast boost 1000 cGy in 4 sessions (hypofractionated)                          Sleep apnea    uses a CPAP    Past Surgical History:  Procedure Laterality Date   BREAST BIOPSY Left 05/02/1999   benign   BREAST LUMPECTOMY WITH AXILLARY LYMPH NODE BIOPSY Right 02/22/14   upper inner   COLONOSCOPY     CYSTOSCOPY     KNEE ARTHROSCOPY WITH MEDIAL MENISECTOMY Left 03/31/2013   Procedure: LEFT KNEE ARTHROSCOPY WITH PARTIAL MEDIAL MENISECTOMY, CHONDROPLASTY OF PATELLA  FEMORAL JOINT, EXCISION OF MEDIAL PLICA, PARTIAL LATERAL MENISECTOMY;  Surgeon: [name], MD;  Location: [place];  Service:  Orthopedics;  Laterality: Left;   TOTAL KNEE ARTHROPLASTY Left 06/29/2013   Procedure: TOTAL KNEE ARTHROPLASTY;  Surgeon: [name], MD;  Location: [place];  Service: Orthopedics;  Laterality: Left;   TOTAL KNEE ARTHROPLASTY Right 03/12/2016   TOTAL KNEE ARTHROPLASTY Right 03/12/2016   Procedure: RIGHT TOTAL KNEE ARTHROPLASTY;  Surgeon: [name], MD;  Location: [place];  Service: Orthopedics;  Laterality: Right;   TUBAL LIGATION      Current Medications: Current Meds  Medication Sig   ALPRAZolam (XANAX) 0.25 MG tablet Take 0.25 mg by mouth daily as needed for anxiety.   aspirin 81 MG tablet Take 81 mg by mouth daily.   blood glucose meter kit and supplies KIT Use as directed daily.   celecoxib (CELEBREX) 100 MG capsule Take 1 capsule (100 mg total) by mouth daily.   glucose blood test strip Use as directed daily.   Lancets (FREESTYLE) lancets Use as directed daily.   levothyroxine (SYNTHROID) 50 MCG tablet Take 1 tablet (50 mcg total) by mouth daily before a meal   metFORMIN (GLUCOPHAGE-XR) 500 MG 24 hr tablet Take 1 tablet (500 mg total) by mouth every evening.   rosuvastatin (CRESTOR) 10 MG tablet Take 1 tablet (10 mg total) by mouth daily.   VITAMIN D, CHOLECALCIFEROL, PO Take 1,000 Units by mouth daily.    zolpidem (AMBIEN) 10 MG tablet Take 1 tablet (10 mg total)  by mouth at bedtime as needed.   [DISCONTINUED] metoprolol succinate (TOPROL-XL) 50 MG 24 hr tablet Take 1 tablet (50 mg total) by mouth daily or immediately following a meal. May take an extra 0.5 tablet as needed for palpitations.   [DISCONTINUED] propafenone (RYTHMOL SR) 225 MG 12 hr capsule Take 1 capsule (225 mg total) by mouth every 12 (twelve) hours.     Allergies:   Escitalopram oxalate and Cardizem  [diltiazem hcl]   Social History   Socioeconomic History   Marital status: Married    Spouse name: Not on file   Number of children: Not on file   Years of education: Not on file   Highest education level: Not on file  Occupational History   Not on file  Tobacco Use   Smoking status: Never   Smokeless tobacco: Never  Substance and Sexual Activity   Alcohol use: Yes    Comment: socailly   Drug use: No   Sexual activity: Yes    Birth control/protection: Post-menopausal    Comment: menarche age 59, g42, P73, 1st live birth age 89, menopause age [age]  Other Topics Concern   Not on file  Social History Narrative   Not on file   Social Determinants of Health   Financial Resource Strain: Not on file  Food Insecurity: Not on file  Transportation Needs: Not on file  Physical Activity: Not on file  Stress: Not on file  Social Connections: Not on file     Family History: The patient's family history includes Diabetes in her father; Heart disease in her father; Hypertension in her father. There is no history of Heart attack.  ROS:   Please see the history of present illness.     All other systems reviewed and are negative.  EKGs/Labs/Other Studies Reviewed:    The following studies were reviewed today:  none  EKG:  EKG is ordered today.  The ekg ordered today demonstrates NSR HR 97 bpm, prolonged QTc 472  Recent Labs: No results found for requested labs within last 365 days.  Recent  Lipid Panel    Component Value Date/Time   CHOL 254 (H) 06/12/2014 0837   TRIG 84  06/12/2014 0837   HDL 84 06/12/2014 0837   CHOLHDL 3.0 06/12/2014 0837   VLDL 17 06/12/2014 0837   LDLCALC 153 (H) 06/12/2014 0837     Risk Assessment/Calculations:           Physical Exam:    VS:  BP 124/90   Pulse 97   Ht $R'5\' 7"'ex$  (1.702 m)   Wt 208 lb (94.3 kg)   SpO2 97%   BMI 32.58 kg/m     Wt Readings from Last 3 Encounters:  10/31/21 208 lb (94.3 kg)  09/04/20 206 lb 6.4 oz (93.6 kg)  02/27/20 199 lb 11.2 oz (90.6 kg)     GEN:  Well nourished, well developed in no acute distress HEENT: Normal NECK: No JVD; No carotid bruits LYMPHATICS: No lymphadenopathy CARDIAC: RRR, no murmurs, rubs, gallops RESPIRATORY:  Clear to auscultation without rales, wheezing or rhonchi  ABDOMEN: Soft, non-tender, non-distended MUSCULOSKELETAL:  No edema; No deformity  SKIN: Warm and dry NEUROLOGIC:  Alert and oriented x 3 PSYCHIATRIC:  Normal affect   ASSESSMENT:    1. Paroxysmal supraventricular tachycardia ([place])   2. Mixed hyperlipidemia   3. Hypothyroidism, unspecified type   4. OSA on CPAP   5. Prolonged Q-T interval on ECG    PLAN:    In order of problems listed above:  SVT: doing well on propafenone and torpol xl. Uses an extra troprol as needed for elevated HRs. Monitors episodes on iwatch and not afib.   HLD: continue crestor 10 mg daily. Labs followed by PCP. TG better controlled with the addition of fish oil   Hypothyroidism: TSH followed PCP  OSA: continue CPAP.   Prolonged QT: noted on ECG today. D/w Dr. Caryl Comes who said propafenone does not affect this. Avoid qt prolonging drugs.   Medication Adjustments/Labs and Tests Ordered: Current medicines are reviewed at length with the patient today.  Concerns regarding medicines are outlined above.  Orders Placed This Encounter  Procedures   EKG 12-Lead   Meds ordered this encounter  Medications   metoprolol succinate (TOPROL-XL) 50 MG 24 hr tablet    Sig: Take 1 tablet (50 mg total) by mouth daily or  immediately following a meal. May take an extra 0.5 tablet as needed for palpitations.    Dispense:  135 tablet    Refill:  2    Pt must keep upcoming appt in June 2023 with Cardiologist before anymore refills. Thank you final Attempt    Order Specific Question:   Supervising Provider    Answer:   Burt Knack, MICHAEL [[id number]]   propafenone (RYTHMOL SR) 225 MG 12 hr capsule    Sig: Take 1 capsule (225 mg total) by mouth every 12 (twelve) hours.    Dispense:  180 capsule    Refill:  3    Order Specific Question:   Supervising Provider    Answer:   Sherren Mocha [[id number]]    Patient Instructions  Medication Instructions:  Your physician recommends that you continue on your current medications as directed. Please refer to the Current Medication list given to you today.  *If you need a refill on your cardiac medications before your next appointment, please call your pharmacy*   Lab Work: None ordered   If you have labs (blood work) drawn today and your tests are completely normal, you will receive your results only by: [place] (if  you have MyChart) OR A paper copy in the mail If you have any lab test that is abnormal or we need to change your treatment, we will call you to review the results.   Testing/Procedures: None ordered    Follow-Up: At [facility], you and your health needs are our priority.  As part of our continuing mission to provide you with exceptional heart care, we have created designated Provider Care Teams.  These Care Teams include your primary Cardiologist (physician) and Advanced Practice Providers (APPs -  Physician Assistants and Nurse Practitioners) who all work together to provide you with the care you need, when you need it.  We recommend signing up for the patient portal called "MyChart".  Sign up information is provided on this After Visit Summary.  MyChart is used to connect with patients for Virtual Visits (Telemedicine).  Patients are able to view  lab/test results, encounter notes, upcoming appointments, etc.  Non-urgent messages can be sent to your provider as well.   To learn more about what you can do with MyChart, go to [email or web address].    Your next appointment:   12 month(s)  The format for your next appointment:   In Person  Provider:   Larae Grooms, MD     Other Instructions   Important Information About Sugar         Signed, Angelena Form, PA-C  10/31/2021 4:56 PM    [place]

## 2021-11-01 ENCOUNTER — Other Ambulatory Visit (HOSPITAL_COMMUNITY): Payer: Self-pay

## 2021-11-04 ENCOUNTER — Telehealth: Payer: Self-pay | Admitting: Interventional Cardiology

## 2021-11-04 ENCOUNTER — Other Ambulatory Visit (HOSPITAL_COMMUNITY): Payer: Self-pay

## 2021-11-04 NOTE — Telephone Encounter (Signed)
Patient called in with concerns regarding a new medication, Metformin prescribed by her PCP. Patient's concerned that Metformin could further prolong her QT wave. After discussing her concerns, she is going to contact her PCP about changing Metformin to a different medication. No other concerns at this time.

## 2021-11-04 NOTE — Telephone Encounter (Signed)
Pt states that she has some concerns and questions regarding EKG results. Pt would like a nurse or PA, Angelena Form to give her a call. Please advise

## 2021-11-08 ENCOUNTER — Other Ambulatory Visit (HOSPITAL_COMMUNITY): Payer: Self-pay

## 2021-11-08 DIAGNOSIS — E668 Other obesity: Secondary | ICD-10-CM | POA: Diagnosis not present

## 2021-11-08 DIAGNOSIS — R7303 Prediabetes: Secondary | ICD-10-CM | POA: Diagnosis not present

## 2021-11-08 MED ORDER — METFORMIN HCL ER 500 MG PO TB24
1000.0000 mg | ORAL_TABLET | Freq: Every evening | ORAL | 1 refills | Status: DC
  Filled 2021-11-08 – 2021-11-14 (×2): qty 180, 90d supply, fill #0

## 2021-11-08 MED ORDER — WEGOVY 0.25 MG/0.5ML ~~LOC~~ SOAJ
0.2500 mg | SUBCUTANEOUS | 0 refills | Status: DC
  Filled 2021-11-08: qty 2, 28d supply, fill #0

## 2021-11-14 ENCOUNTER — Other Ambulatory Visit (HOSPITAL_COMMUNITY): Payer: Self-pay

## 2021-11-20 ENCOUNTER — Other Ambulatory Visit (HOSPITAL_COMMUNITY): Payer: Self-pay

## 2021-11-22 ENCOUNTER — Other Ambulatory Visit (HOSPITAL_COMMUNITY): Payer: Self-pay

## 2021-11-25 ENCOUNTER — Other Ambulatory Visit (HOSPITAL_COMMUNITY): Payer: Self-pay

## 2021-11-25 MED ORDER — ZOLPIDEM TARTRATE 10 MG PO TABS
10.0000 mg | ORAL_TABLET | Freq: Every evening | ORAL | 2 refills | Status: DC | PRN
  Filled 2021-11-25: qty 30, 30d supply, fill #0
  Filled 2021-12-30: qty 30, 30d supply, fill #1
  Filled 2022-01-30: qty 30, 30d supply, fill #2

## 2021-11-27 ENCOUNTER — Other Ambulatory Visit (HOSPITAL_COMMUNITY): Payer: Self-pay

## 2021-11-29 ENCOUNTER — Other Ambulatory Visit (HOSPITAL_COMMUNITY): Payer: Self-pay

## 2021-11-29 MED ORDER — ALPRAZOLAM 0.25 MG PO TABS
0.2500 mg | ORAL_TABLET | Freq: Every day | ORAL | 2 refills | Status: DC | PRN
  Filled 2021-11-29: qty 30, 30d supply, fill #0
  Filled 2022-01-30: qty 30, 30d supply, fill #1
  Filled 2022-03-06: qty 30, 30d supply, fill #2

## 2021-12-04 ENCOUNTER — Other Ambulatory Visit (HOSPITAL_COMMUNITY): Payer: Self-pay

## 2021-12-06 ENCOUNTER — Other Ambulatory Visit (HOSPITAL_COMMUNITY): Payer: Self-pay

## 2021-12-06 DIAGNOSIS — E668 Other obesity: Secondary | ICD-10-CM | POA: Diagnosis not present

## 2021-12-06 DIAGNOSIS — M545 Low back pain, unspecified: Secondary | ICD-10-CM | POA: Diagnosis not present

## 2021-12-06 DIAGNOSIS — R7303 Prediabetes: Secondary | ICD-10-CM | POA: Diagnosis not present

## 2021-12-06 MED ORDER — CELECOXIB 100 MG PO CAPS
100.0000 mg | ORAL_CAPSULE | Freq: Every day | ORAL | 0 refills | Status: DC
  Filled 2021-12-06 – 2021-12-16 (×2): qty 30, 30d supply, fill #0

## 2021-12-16 ENCOUNTER — Other Ambulatory Visit (HOSPITAL_COMMUNITY): Payer: Self-pay

## 2021-12-20 DIAGNOSIS — R7303 Prediabetes: Secondary | ICD-10-CM | POA: Diagnosis not present

## 2021-12-22 DIAGNOSIS — G4733 Obstructive sleep apnea (adult) (pediatric): Secondary | ICD-10-CM | POA: Diagnosis not present

## 2021-12-25 ENCOUNTER — Other Ambulatory Visit (HOSPITAL_COMMUNITY): Payer: Self-pay

## 2021-12-30 ENCOUNTER — Other Ambulatory Visit (HOSPITAL_COMMUNITY): Payer: Self-pay

## 2021-12-31 ENCOUNTER — Other Ambulatory Visit (HOSPITAL_COMMUNITY): Payer: Self-pay

## 2021-12-31 DIAGNOSIS — R7303 Prediabetes: Secondary | ICD-10-CM | POA: Diagnosis not present

## 2021-12-31 DIAGNOSIS — E668 Other obesity: Secondary | ICD-10-CM | POA: Diagnosis not present

## 2021-12-31 DIAGNOSIS — Z6831 Body mass index (BMI) 31.0-31.9, adult: Secondary | ICD-10-CM | POA: Diagnosis not present

## 2021-12-31 MED ORDER — SEMAGLUTIDE(0.25 OR 0.5MG/DOS) 2 MG/3ML ~~LOC~~ SOPN
0.2500 mg | PEN_INJECTOR | SUBCUTANEOUS | 0 refills | Status: DC
Start: 2021-12-31 — End: 2022-02-12
  Filled 2021-12-31: qty 3, 56d supply, fill #0

## 2022-01-15 ENCOUNTER — Other Ambulatory Visit (HOSPITAL_COMMUNITY): Payer: Self-pay

## 2022-01-30 ENCOUNTER — Other Ambulatory Visit (HOSPITAL_COMMUNITY): Payer: Self-pay

## 2022-02-05 ENCOUNTER — Other Ambulatory Visit (HOSPITAL_COMMUNITY): Payer: Self-pay

## 2022-02-06 ENCOUNTER — Other Ambulatory Visit (HOSPITAL_COMMUNITY): Payer: Self-pay

## 2022-02-07 ENCOUNTER — Other Ambulatory Visit (HOSPITAL_COMMUNITY): Payer: Self-pay

## 2022-02-12 ENCOUNTER — Other Ambulatory Visit (HOSPITAL_COMMUNITY): Payer: Self-pay

## 2022-02-12 MED ORDER — OZEMPIC (0.25 OR 0.5 MG/DOSE) 2 MG/3ML ~~LOC~~ SOPN
0.2500 mg | PEN_INJECTOR | SUBCUTANEOUS | 0 refills | Status: DC
  Filled 2022-02-12 – 2022-02-13 (×2): qty 3, 56d supply, fill #0

## 2022-02-13 ENCOUNTER — Other Ambulatory Visit (HOSPITAL_COMMUNITY): Payer: Self-pay

## 2022-02-17 ENCOUNTER — Other Ambulatory Visit (HOSPITAL_COMMUNITY): Payer: Self-pay

## 2022-02-17 MED ORDER — OZEMPIC (0.25 OR 0.5 MG/DOSE) 2 MG/3ML ~~LOC~~ SOPN
0.5000 mg | PEN_INJECTOR | SUBCUTANEOUS | 0 refills | Status: DC
  Filled 2022-02-17: qty 3, 28d supply, fill #0

## 2022-03-03 ENCOUNTER — Other Ambulatory Visit (HOSPITAL_COMMUNITY): Payer: Self-pay

## 2022-03-03 DIAGNOSIS — E668 Other obesity: Secondary | ICD-10-CM | POA: Diagnosis not present

## 2022-03-03 DIAGNOSIS — R7303 Prediabetes: Secondary | ICD-10-CM | POA: Diagnosis not present

## 2022-03-03 DIAGNOSIS — Z6831 Body mass index (BMI) 31.0-31.9, adult: Secondary | ICD-10-CM | POA: Diagnosis not present

## 2022-03-03 DIAGNOSIS — R030 Elevated blood-pressure reading, without diagnosis of hypertension: Secondary | ICD-10-CM | POA: Diagnosis not present

## 2022-03-03 MED ORDER — OZEMPIC (0.25 OR 0.5 MG/DOSE) 2 MG/3ML ~~LOC~~ SOPN
0.5000 mg | PEN_INJECTOR | SUBCUTANEOUS | 0 refills | Status: DC
  Filled 2022-03-03 – 2022-03-17 (×2): qty 3, 28d supply, fill #0

## 2022-03-04 ENCOUNTER — Other Ambulatory Visit (HOSPITAL_COMMUNITY): Payer: Self-pay

## 2022-03-04 MED ORDER — ZOLPIDEM TARTRATE 10 MG PO TABS
10.0000 mg | ORAL_TABLET | Freq: Every evening | ORAL | 2 refills | Status: DC | PRN
  Filled 2022-03-04: qty 30, 30d supply, fill #0
  Filled 2022-04-08: qty 30, 30d supply, fill #1
  Filled 2022-05-07: qty 30, 30d supply, fill #2

## 2022-03-06 ENCOUNTER — Other Ambulatory Visit (HOSPITAL_COMMUNITY): Payer: Self-pay

## 2022-03-17 ENCOUNTER — Other Ambulatory Visit (HOSPITAL_COMMUNITY): Payer: Self-pay

## 2022-04-08 ENCOUNTER — Other Ambulatory Visit (HOSPITAL_COMMUNITY): Payer: Self-pay

## 2022-04-09 ENCOUNTER — Other Ambulatory Visit (HOSPITAL_COMMUNITY): Payer: Self-pay

## 2022-04-09 MED ORDER — LEVOTHYROXINE SODIUM 50 MCG PO TABS
50.0000 ug | ORAL_TABLET | Freq: Every day | ORAL | 1 refills | Status: DC
  Filled 2022-04-09: qty 90, 90d supply, fill #0
  Filled 2022-07-07: qty 90, 90d supply, fill #1

## 2022-04-09 MED ORDER — OZEMPIC (1 MG/DOSE) 4 MG/3ML ~~LOC~~ SOPN
1.0000 mg | PEN_INJECTOR | SUBCUTANEOUS | 1 refills | Status: DC
  Filled 2022-04-09: qty 3, 28d supply, fill #0

## 2022-04-11 ENCOUNTER — Other Ambulatory Visit (HOSPITAL_COMMUNITY): Payer: Self-pay

## 2022-04-11 DIAGNOSIS — G4733 Obstructive sleep apnea (adult) (pediatric): Secondary | ICD-10-CM | POA: Diagnosis not present

## 2022-04-11 MED ORDER — ALPRAZOLAM 0.25 MG PO TABS
0.2500 mg | ORAL_TABLET | Freq: Every day | ORAL | 2 refills | Status: DC | PRN
  Filled 2022-04-11: qty 30, 30d supply, fill #0
  Filled 2022-07-07: qty 30, 30d supply, fill #1
  Filled 2022-09-05: qty 30, 30d supply, fill #2

## 2022-04-16 ENCOUNTER — Other Ambulatory Visit (HOSPITAL_COMMUNITY): Payer: Self-pay

## 2022-04-22 DIAGNOSIS — Z6831 Body mass index (BMI) 31.0-31.9, adult: Secondary | ICD-10-CM | POA: Diagnosis not present

## 2022-04-22 DIAGNOSIS — E782 Mixed hyperlipidemia: Secondary | ICD-10-CM | POA: Diagnosis not present

## 2022-04-22 DIAGNOSIS — R87611 Atypical squamous cells cannot exclude high grade squamous intraepithelial lesion on cytologic smear of cervix (ASC-H): Secondary | ICD-10-CM | POA: Diagnosis not present

## 2022-04-22 DIAGNOSIS — R030 Elevated blood-pressure reading, without diagnosis of hypertension: Secondary | ICD-10-CM | POA: Diagnosis not present

## 2022-04-22 DIAGNOSIS — R945 Abnormal results of liver function studies: Secondary | ICD-10-CM | POA: Diagnosis not present

## 2022-04-22 DIAGNOSIS — Z124 Encounter for screening for malignant neoplasm of cervix: Secondary | ICD-10-CM | POA: Diagnosis not present

## 2022-04-22 DIAGNOSIS — E668 Other obesity: Secondary | ICD-10-CM | POA: Diagnosis not present

## 2022-04-22 DIAGNOSIS — R7303 Prediabetes: Secondary | ICD-10-CM | POA: Diagnosis not present

## 2022-04-23 ENCOUNTER — Other Ambulatory Visit (HOSPITAL_COMMUNITY): Payer: Self-pay

## 2022-04-23 MED ORDER — OZEMPIC (2 MG/DOSE) 8 MG/3ML ~~LOC~~ SOPN
2.0000 mg | PEN_INJECTOR | SUBCUTANEOUS | 2 refills | Status: DC
  Filled 2022-05-07: qty 3, 28d supply, fill #0
  Filled 2022-06-09: qty 3, 28d supply, fill #1
  Filled 2022-07-07: qty 3, 28d supply, fill #2

## 2022-04-23 MED ORDER — ROSUVASTATIN CALCIUM 10 MG PO TABS
10.0000 mg | ORAL_TABLET | Freq: Every day | ORAL | 1 refills | Status: DC
  Filled 2022-04-23: qty 90, 90d supply, fill #0
  Filled 2022-08-06: qty 90, 90d supply, fill #1

## 2022-04-28 ENCOUNTER — Other Ambulatory Visit (HOSPITAL_COMMUNITY): Payer: Self-pay

## 2022-04-28 MED ORDER — AMOXICILLIN 500 MG PO CAPS
2000.0000 mg | ORAL_CAPSULE | ORAL | 2 refills | Status: DC
  Filled 2022-04-28: qty 4, 1d supply, fill #0

## 2022-04-29 ENCOUNTER — Other Ambulatory Visit (HOSPITAL_COMMUNITY): Payer: Self-pay

## 2022-05-07 ENCOUNTER — Other Ambulatory Visit (HOSPITAL_COMMUNITY): Payer: Self-pay

## 2022-05-08 ENCOUNTER — Other Ambulatory Visit (HOSPITAL_COMMUNITY): Payer: Self-pay

## 2022-05-22 ENCOUNTER — Other Ambulatory Visit (HOSPITAL_COMMUNITY): Payer: Self-pay

## 2022-05-22 DIAGNOSIS — M545 Low back pain, unspecified: Secondary | ICD-10-CM | POA: Diagnosis not present

## 2022-05-22 MED ORDER — IBUPROFEN 600 MG PO TABS
600.0000 mg | ORAL_TABLET | Freq: Three times a day (TID) | ORAL | 0 refills | Status: DC | PRN
  Filled 2022-05-22: qty 30, 10d supply, fill #0

## 2022-05-22 MED ORDER — METHOCARBAMOL 500 MG PO TABS
500.0000 mg | ORAL_TABLET | Freq: Three times a day (TID) | ORAL | 0 refills | Status: DC | PRN
  Filled 2022-05-22: qty 15, 5d supply, fill #0

## 2022-05-22 MED ORDER — HYDROCODONE-ACETAMINOPHEN 5-325 MG PO TABS
1.0000 | ORAL_TABLET | Freq: Four times a day (QID) | ORAL | 0 refills | Status: DC | PRN
  Filled 2022-05-22: qty 20, 5d supply, fill #0

## 2022-06-05 ENCOUNTER — Other Ambulatory Visit (HOSPITAL_COMMUNITY): Payer: Self-pay

## 2022-06-06 ENCOUNTER — Other Ambulatory Visit (HOSPITAL_COMMUNITY): Payer: Self-pay

## 2022-06-06 MED ORDER — ZOLPIDEM TARTRATE 10 MG PO TABS
10.0000 mg | ORAL_TABLET | Freq: Every evening | ORAL | 2 refills | Status: DC | PRN
  Filled 2022-06-06: qty 30, 30d supply, fill #0
  Filled 2022-07-07: qty 30, 30d supply, fill #1
  Filled 2022-08-05: qty 30, 30d supply, fill #2

## 2022-06-07 ENCOUNTER — Other Ambulatory Visit (HOSPITAL_COMMUNITY): Payer: Self-pay

## 2022-06-09 ENCOUNTER — Other Ambulatory Visit (HOSPITAL_COMMUNITY): Payer: Self-pay

## 2022-07-07 ENCOUNTER — Other Ambulatory Visit (HOSPITAL_COMMUNITY): Payer: Self-pay

## 2022-07-21 DIAGNOSIS — E668 Other obesity: Secondary | ICD-10-CM | POA: Diagnosis not present

## 2022-07-21 DIAGNOSIS — E782 Mixed hyperlipidemia: Secondary | ICD-10-CM | POA: Diagnosis not present

## 2022-07-23 DIAGNOSIS — E782 Mixed hyperlipidemia: Secondary | ICD-10-CM | POA: Diagnosis not present

## 2022-07-23 DIAGNOSIS — E039 Hypothyroidism, unspecified: Secondary | ICD-10-CM | POA: Diagnosis not present

## 2022-07-23 DIAGNOSIS — E668 Other obesity: Secondary | ICD-10-CM | POA: Diagnosis not present

## 2022-07-23 DIAGNOSIS — M545 Low back pain, unspecified: Secondary | ICD-10-CM | POA: Diagnosis not present

## 2022-07-23 DIAGNOSIS — G4733 Obstructive sleep apnea (adult) (pediatric): Secondary | ICD-10-CM | POA: Diagnosis not present

## 2022-07-23 DIAGNOSIS — I471 Supraventricular tachycardia, unspecified: Secondary | ICD-10-CM | POA: Diagnosis not present

## 2022-07-24 ENCOUNTER — Other Ambulatory Visit (HOSPITAL_COMMUNITY): Payer: Self-pay

## 2022-07-24 MED ORDER — WEGOVY 2.4 MG/0.75ML ~~LOC~~ SOAJ
2.4000 mg | SUBCUTANEOUS | 2 refills | Status: DC
  Filled 2022-07-24: qty 3, 28d supply, fill #0

## 2022-08-05 ENCOUNTER — Other Ambulatory Visit: Payer: Self-pay

## 2022-08-06 ENCOUNTER — Other Ambulatory Visit (HOSPITAL_COMMUNITY): Payer: Self-pay

## 2022-08-12 ENCOUNTER — Other Ambulatory Visit (HOSPITAL_COMMUNITY): Payer: Self-pay

## 2022-08-14 ENCOUNTER — Other Ambulatory Visit (HOSPITAL_COMMUNITY): Payer: Self-pay

## 2022-08-15 ENCOUNTER — Other Ambulatory Visit (HOSPITAL_COMMUNITY): Payer: Self-pay

## 2022-08-15 MED ORDER — OZEMPIC (2 MG/DOSE) 8 MG/3ML ~~LOC~~ SOPN
2.0000 mg | PEN_INJECTOR | SUBCUTANEOUS | 2 refills | Status: AC
  Filled 2022-08-15: qty 3, 28d supply, fill #0
  Filled 2022-09-09 – 2022-09-23 (×3): qty 3, 28d supply, fill #1

## 2022-08-25 ENCOUNTER — Other Ambulatory Visit (HOSPITAL_COMMUNITY): Payer: Self-pay

## 2022-08-25 DIAGNOSIS — S52125A Nondisplaced fracture of head of left radius, initial encounter for closed fracture: Secondary | ICD-10-CM | POA: Diagnosis not present

## 2022-08-25 MED ORDER — OXYCODONE-ACETAMINOPHEN 5-325 MG PO TABS
1.0000 | ORAL_TABLET | Freq: Two times a day (BID) | ORAL | 0 refills | Status: DC
  Filled 2022-08-25: qty 10, 5d supply, fill #0

## 2022-08-25 MED ORDER — NAPROXEN 500 MG PO TABS
500.0000 mg | ORAL_TABLET | Freq: Two times a day (BID) | ORAL | 1 refills | Status: DC
  Filled 2022-08-25: qty 60, 30d supply, fill #0

## 2022-09-02 ENCOUNTER — Other Ambulatory Visit (HOSPITAL_COMMUNITY): Payer: Self-pay | Admitting: Sports Medicine

## 2022-09-02 DIAGNOSIS — S52122A Displaced fracture of head of left radius, initial encounter for closed fracture: Secondary | ICD-10-CM

## 2022-09-02 DIAGNOSIS — S52125A Nondisplaced fracture of head of left radius, initial encounter for closed fracture: Secondary | ICD-10-CM | POA: Diagnosis not present

## 2022-09-03 ENCOUNTER — Ambulatory Visit (HOSPITAL_COMMUNITY)
Admission: RE | Admit: 2022-09-03 | Discharge: 2022-09-03 | Disposition: A | Discharge status: Home / Self Care | Payer: Commercial Managed Care - PPO | Source: Ambulatory Visit | Attending: Sports Medicine | Admitting: Sports Medicine

## 2022-09-03 DIAGNOSIS — S52122A Displaced fracture of head of left radius, initial encounter for closed fracture: Secondary | ICD-10-CM | POA: Insufficient documentation

## 2022-09-05 ENCOUNTER — Other Ambulatory Visit (HOSPITAL_COMMUNITY): Payer: Self-pay

## 2022-09-05 ENCOUNTER — Other Ambulatory Visit: Payer: Self-pay

## 2022-09-08 ENCOUNTER — Other Ambulatory Visit (HOSPITAL_COMMUNITY): Payer: Self-pay

## 2022-09-08 MED ORDER — ZOLPIDEM TARTRATE 10 MG PO TABS
10.0000 mg | ORAL_TABLET | Freq: Every evening | ORAL | 0 refills | Status: DC | PRN
  Filled 2022-09-08: qty 30, 30d supply, fill #0

## 2022-09-09 ENCOUNTER — Other Ambulatory Visit (HOSPITAL_COMMUNITY): Payer: Self-pay

## 2022-09-16 ENCOUNTER — Other Ambulatory Visit (HOSPITAL_COMMUNITY): Payer: Self-pay

## 2022-09-17 ENCOUNTER — Other Ambulatory Visit (HOSPITAL_COMMUNITY): Payer: Self-pay

## 2022-09-17 MED ORDER — OZEMPIC (2 MG/DOSE) 8 MG/3ML ~~LOC~~ SOPN
2.0000 mg | PEN_INJECTOR | SUBCUTANEOUS | 2 refills | Status: DC
  Filled 2022-09-17 – 2022-09-23 (×3): qty 3, 28d supply, fill #0

## 2022-09-18 ENCOUNTER — Other Ambulatory Visit (HOSPITAL_COMMUNITY): Payer: Self-pay

## 2022-09-22 DIAGNOSIS — E782 Mixed hyperlipidemia: Secondary | ICD-10-CM | POA: Diagnosis not present

## 2022-09-22 DIAGNOSIS — E668 Other obesity: Secondary | ICD-10-CM | POA: Diagnosis not present

## 2022-09-23 ENCOUNTER — Other Ambulatory Visit (HOSPITAL_COMMUNITY): Payer: Self-pay

## 2022-09-23 DIAGNOSIS — S52125A Nondisplaced fracture of head of left radius, initial encounter for closed fracture: Secondary | ICD-10-CM | POA: Diagnosis not present

## 2022-09-25 ENCOUNTER — Other Ambulatory Visit (HOSPITAL_COMMUNITY): Payer: Self-pay

## 2022-09-25 DIAGNOSIS — Z86010 Personal history of colonic polyps: Secondary | ICD-10-CM | POA: Diagnosis not present

## 2022-09-25 DIAGNOSIS — Z1211 Encounter for screening for malignant neoplasm of colon: Secondary | ICD-10-CM | POA: Diagnosis not present

## 2022-09-25 DIAGNOSIS — G4733 Obstructive sleep apnea (adult) (pediatric): Secondary | ICD-10-CM | POA: Diagnosis not present

## 2022-09-25 DIAGNOSIS — E669 Obesity, unspecified: Secondary | ICD-10-CM | POA: Diagnosis not present

## 2022-09-25 DIAGNOSIS — E039 Hypothyroidism, unspecified: Secondary | ICD-10-CM | POA: Diagnosis not present

## 2022-09-26 ENCOUNTER — Other Ambulatory Visit (HOSPITAL_COMMUNITY): Payer: Self-pay

## 2022-09-26 DIAGNOSIS — E668 Other obesity: Secondary | ICD-10-CM | POA: Diagnosis not present

## 2022-09-26 MED ORDER — METFORMIN HCL ER 500 MG PO TB24
500.0000 mg | ORAL_TABLET | Freq: Two times a day (BID) | ORAL | 2 refills | Status: DC
  Filled 2022-09-26: qty 60, 30d supply, fill #0
  Filled 2023-04-20: qty 60, 30d supply, fill #1
  Filled 2023-05-25: qty 60, 30d supply, fill #2

## 2022-09-29 DIAGNOSIS — G4733 Obstructive sleep apnea (adult) (pediatric): Secondary | ICD-10-CM | POA: Diagnosis not present

## 2022-10-07 ENCOUNTER — Other Ambulatory Visit: Payer: Self-pay

## 2022-10-07 ENCOUNTER — Other Ambulatory Visit (HOSPITAL_COMMUNITY): Payer: Self-pay

## 2022-10-07 DIAGNOSIS — S52125D Nondisplaced fracture of head of left radius, subsequent encounter for closed fracture with routine healing: Secondary | ICD-10-CM | POA: Diagnosis not present

## 2022-10-09 DIAGNOSIS — S52125D Nondisplaced fracture of head of left radius, subsequent encounter for closed fracture with routine healing: Secondary | ICD-10-CM | POA: Diagnosis not present

## 2022-10-10 ENCOUNTER — Other Ambulatory Visit (HOSPITAL_COMMUNITY): Payer: Self-pay

## 2022-10-10 DIAGNOSIS — Z6830 Body mass index (BMI) 30.0-30.9, adult: Secondary | ICD-10-CM | POA: Diagnosis not present

## 2022-10-10 DIAGNOSIS — N952 Postmenopausal atrophic vaginitis: Secondary | ICD-10-CM | POA: Diagnosis not present

## 2022-10-10 DIAGNOSIS — Z01419 Encounter for gynecological examination (general) (routine) without abnormal findings: Secondary | ICD-10-CM | POA: Diagnosis not present

## 2022-10-10 DIAGNOSIS — R319 Hematuria, unspecified: Secondary | ICD-10-CM | POA: Diagnosis not present

## 2022-10-10 DIAGNOSIS — R87611 Atypical squamous cells cannot exclude high grade squamous intraepithelial lesion on cytologic smear of cervix (ASC-H): Secondary | ICD-10-CM | POA: Diagnosis not present

## 2022-10-13 ENCOUNTER — Other Ambulatory Visit: Payer: Self-pay | Admitting: Physician Assistant

## 2022-10-13 ENCOUNTER — Other Ambulatory Visit (HOSPITAL_COMMUNITY): Payer: Self-pay

## 2022-10-13 DIAGNOSIS — S52125D Nondisplaced fracture of head of left radius, subsequent encounter for closed fracture with routine healing: Secondary | ICD-10-CM | POA: Diagnosis not present

## 2022-10-13 MED ORDER — ZOLPIDEM TARTRATE 10 MG PO TABS
10.0000 mg | ORAL_TABLET | Freq: Every evening | ORAL | 2 refills | Status: DC | PRN
  Filled 2022-10-13: qty 30, 30d supply, fill #0
  Filled 2022-11-14: qty 30, 30d supply, fill #1
  Filled 2022-12-16: qty 30, 30d supply, fill #2

## 2022-10-14 ENCOUNTER — Other Ambulatory Visit (HOSPITAL_COMMUNITY): Payer: Self-pay

## 2022-10-14 DIAGNOSIS — S52125D Nondisplaced fracture of head of left radius, subsequent encounter for closed fracture with routine healing: Secondary | ICD-10-CM | POA: Diagnosis not present

## 2022-10-14 MED ORDER — PROPAFENONE HCL ER 225 MG PO CP12
225.0000 mg | ORAL_CAPSULE | Freq: Two times a day (BID) | ORAL | 0 refills | Status: DC
  Filled 2022-10-14 – 2022-11-05 (×2): qty 180, 90d supply, fill #0

## 2022-10-15 DIAGNOSIS — S52125D Nondisplaced fracture of head of left radius, subsequent encounter for closed fracture with routine healing: Secondary | ICD-10-CM | POA: Diagnosis not present

## 2022-10-27 ENCOUNTER — Other Ambulatory Visit (HOSPITAL_COMMUNITY): Payer: Self-pay

## 2022-10-27 DIAGNOSIS — S52125D Nondisplaced fracture of head of left radius, subsequent encounter for closed fracture with routine healing: Secondary | ICD-10-CM | POA: Diagnosis not present

## 2022-10-27 MED ORDER — LEVOTHYROXINE SODIUM 50 MCG PO TABS
50.0000 ug | ORAL_TABLET | Freq: Every day | ORAL | 1 refills | Status: DC
  Filled 2022-10-27: qty 90, 90d supply, fill #0
  Filled 2023-04-20: qty 90, 90d supply, fill #1

## 2022-11-02 NOTE — Progress Notes (Unsigned)
Office Visit    Patient Name: Pamela Underwood Date of Encounter: 11/02/2022  Primary Care Provider:  Harvest Forest, MD Primary Cardiologist:  Lance Muss, MD Primary Electrophysiologist: None   Past Medical History    Past Medical History:  Diagnosis Date   Arthritis    Atrial tachycardia (HCC)    Back pain    occasionally   Breast cancer (HCC) 02/22/14   right upper inner   Chronic anxiety    takes Xanax daily as needed when heart rate going up   Colitis    hx-one   Hyperlipidemia    Insomnia    takes Ambien nightly as needed   Overweight (BMI 25.0-29.9) 12/10/2016   PSVT (paroxysmal supraventricular tachycardia) (HCC)    S/P radiation therapy 03/28/2014 through 04/27/2014                                03/28/2014 through 04/27/2014                                                                         Right breast 4250 cGy 17 sessions, right breast boost 1000 cGy in 4 sessions (hypofractionated)                          Sleep apnea    uses a CPAP   Past Surgical History:  Procedure Laterality Date   BREAST BIOPSY Left 05/02/1999   benign   BREAST LUMPECTOMY WITH AXILLARY LYMPH NODE BIOPSY Right 02/22/14   upper inner   COLONOSCOPY     CYSTOSCOPY     KNEE ARTHROSCOPY WITH MEDIAL MENISECTOMY Left 03/31/2013   Procedure: LEFT KNEE ARTHROSCOPY WITH PARTIAL MEDIAL MENISECTOMY, CHONDROPLASTY OF PATELLA  FEMORAL JOINT, EXCISION OF MEDIAL PLICA, PARTIAL LATERAL MENISECTOMY;  Surgeon: Loreta Ave, MD;  Location: [facility];  Service: Orthopedics;  Laterality: Left;   TOTAL KNEE ARTHROPLASTY Left 06/29/2013   Procedure: TOTAL KNEE ARTHROPLASTY;  Surgeon: Loreta Ave, MD;  Location: [facility] OR;  Service: Orthopedics;  Laterality: Left;   TOTAL KNEE ARTHROPLASTY Right 03/12/2016   TOTAL KNEE ARTHROPLASTY Right 03/12/2016   Procedure: RIGHT TOTAL KNEE ARTHROPLASTY;  Surgeon: Loreta Ave, MD;  Location: [facility] OR;  Service: Orthopedics;  Laterality:  Right;   TUBAL LIGATION      Allergies  Allergies  Allergen Reactions   Escitalopram Oxalate Other (See Comments)    Hives, welts, all over skin rash   Cardizem  [Diltiazem Hcl] Hives     History of Present Illness    Pamela Underwood  is a 67 year old female with a PMH of SVT, HLD, breast CA s/p surgery and radiation therapy, obesity, hypothyroidism, OSA (on CPAP) who presents today for 1 year follow-up.  Pamela Underwood is followed by Dr. Eldridge Dace for management of SVT and Dr. Mayford Knife for sleep apnea.  She was initially controlled with Cardizem and propafenone but developed reaction and Cardizem was switched to Toprol.  She was offered EP workup and consideration of ablation but patient declined.  She was last seen by Carlean Jews, PA on 10/31/2021 for annual follow-up.  During visit patient reported doing well and tolerating Toprol-XL and propafenone without any adverse reactions.  She reported using an additional Toprol as needed for elevated heart rate.  She was also noted to have prolonged QT on EKG and advised to avoid QT prolonging drugs.  Since last being seen in the office patient reports***.  Patient denies chest pain, palpitations, dyspnea, PND, orthopnea, nausea, vomiting, dizziness, syncope, edema, weight gain, or early satiety.     ***Notes: -Avoid QT prolonging drugs Home Medications    Current Outpatient Medications  Medication Sig Dispense Refill   ALPRAZolam (XANAX) 0.25 MG tablet Take 0.25 mg by mouth daily as needed for anxiety.  4   ALPRAZolam (XANAX) 0.25 MG tablet Take 1 tablet (0.25 mg total) by mouth daily as needed. 30 tablet 2   amoxicillin (AMOXIL) 500 MG capsule Take 4 capsules (2,000 mg total) by mouth 1 hour prior to appointment 4 capsule 2   aspirin 81 MG tablet Take 81 mg by mouth daily.     benzonatate (TESSALON) 200 MG capsule Take 1 capsule (200 mg total) by mouth 3 (three) times daily. (Patient not taking: Reported on 10/31/2021) 21 capsule 0    blood glucose meter kit and supplies KIT Use as directed daily. 1 each 0   celecoxib (CELEBREX) 100 MG capsule Take 1 capsule (100 mg total) by mouth daily. 30 capsule 0   celecoxib (CELEBREX) 200 MG capsule TAKE 1 CAPSULE BY MOUTH ONCE A DAY WITH FOOD FOR PAIN AND SWELLING (Patient not taking: Reported on 10/31/2021) 30 capsule 0   celecoxib (CELEBREX) 200 MG capsule Take 1 capsule (200 mg total) by mouth daily with food as needed for pain and swelling. (Patient not taking: Reported on 10/31/2021) 90 capsule 0   glucose blood test strip Use as directed daily. 100 each 1   Lancets (FREESTYLE) lancets Use as directed daily. 100 each 1   levothyroxine (EUTHYROX) 50 MCG tablet Take 1 tablet (50 mcg total) by mouth daily before meal 90 tablet 1   levothyroxine (SYNTHROID) 50 MCG tablet TAKE 1 TABLET BY MOUTH ONCE DAILY BEFORE MEALS 30 tablet 3   levothyroxine (SYNTHROID) 50 MCG tablet Take 1 tablet (50 mcg total) by mouth daily before a meal 60 tablet 1   metFORMIN (GLUCOPHAGE-XR) 500 MG 24 hr tablet Take 1 tablet (500 mg total) by mouth every evening. 90 tablet 1   metFORMIN (GLUCOPHAGE-XR) 500 MG 24 hr tablet Take 2 tablets (1,000 mg total) by mouth every evening. 180 tablet 1   metFORMIN (GLUCOPHAGE-XR) 500 MG 24 hr tablet Take 1 tablet (500 mg total) by mouth 2 (two) times daily. 60 tablet 2   metoprolol succinate (TOPROL-XL) 50 MG 24 hr tablet Take 1 tablet (50 mg total) by mouth daily or immediately following a meal. May take an extra 0.5 tablet as needed for palpitations. 135 tablet 2   Molnupiravir 200 MG CAPS Take 4 capsules every 12 hours by oral route for 5 days. (Patient not taking: Reported on 10/31/2021) 40 capsule 0   naproxen (NAPROSYN) 500 MG tablet Take 1 tablet (500 mg total) by mouth 2 (two) times daily if needed for pain, with food 60 tablet 1   oxyCODONE-acetaminophen (PERCOCET/ROXICET) 5-325 MG tablet Take 1 tablet by mouth 2 (two) times daily. as needed for pain for severe pain 10 tablet  0   propafenone (RYTHMOL SR) 225 MG 12 hr capsule Take 1 capsule (225 mg total) by mouth every 12 (twelve) hours. 180 capsule 0  rosuvastatin (CRESTOR) 10 MG tablet Take 1 tablet (10 mg total) by mouth daily. (Patient not taking: Reported on 10/31/2021) 90 tablet 1   rosuvastatin (CRESTOR) 10 MG tablet Take 1 tablet (10 mg total) by mouth daily. 90 tablet 1   rosuvastatin (CRESTOR) 10 MG tablet Take 1 tablet (10 mg total) by mouth daily. 90 tablet 1   Semaglutide, 1 MG/DOSE, (OZEMPIC, 1 MG/DOSE,) 4 MG/3ML SOPN Inject 1 mg into the skin once a week. 3 mL 1   Semaglutide, 2 MG/DOSE, (OZEMPIC, 2 MG/DOSE,) 8 MG/3ML SOPN Inject 2 mg into the skin once a week. 3 mL 2   Semaglutide, 2 MG/DOSE, (OZEMPIC, 2 MG/DOSE,) 8 MG/3ML SOPN Inject 2 mg into the skin once a week. 3 mL 2   Semaglutide,0.25 or 0.5MG /DOS, (OZEMPIC, 0.25 OR 0.5 MG/DOSE,) 2 MG/3ML SOPN Inject 0.25 mg into the skin once a week. 3 mL 0   Semaglutide,0.25 or 0.5MG /DOS, (OZEMPIC, 0.25 OR 0.5 MG/DOSE,) 2 MG/3ML SOPN Inject 0.5 mg into the skin once a week. 3 mL 0   VITAMIN D, CHOLECALCIFEROL, PO Take 1,000 Units by mouth daily.      zolpidem (AMBIEN) 10 MG tablet Take 10 mg by mouth at bedtime as needed for sleep. (Patient not taking: Reported on 10/31/2021)     zolpidem (AMBIEN) 10 MG tablet Take 1 tablet (10 mg total) by mouth at bedtime as needed. 30 tablet 2   No current facility-administered medications for this visit.     Review of Systems  Please see the history of present illness.    (+)*** (+)***  All other systems reviewed and are otherwise negative except as noted above.  Physical Exam    Wt Readings from Last 3 Encounters:  10/31/21 208 lb (94.3 kg)  09/04/20 206 lb 6.4 oz (93.6 kg)  02/27/20 199 lb 11.2 oz (90.6 kg)   ON:GEXBM were no vitals filed for this visit.,There is no height or weight on file to calculate BMI.  Constitutional:      Appearance: Healthy appearance. Not in distress.  Neck:     Vascular: JVD  normal.  Pulmonary:     Effort: Pulmonary effort is normal.     Breath sounds: No wheezing. No rales. Diminished in the bases Cardiovascular:     Normal rate. Regular rhythm. Normal S1. Normal S2.      Murmurs: There is no murmur.  Edema:    Peripheral edema absent.  Abdominal:     Palpations: Abdomen is soft non tender. There is no hepatomegaly.  Skin:    General: Skin is warm and dry.  Neurological:     General: No focal deficit present.     Mental Status: Alert and oriented to person, place and time.     Cranial Nerves: Cranial nerves are intact.  EKG/LABS/ Recent Cardiac Studies    ECG personally reviewed by me today - ***      Risk Assessment/Calculations:   {Does this patient have ATRIAL FIBRILLATION?:[id number]}        Lab Results  Component Value Date   WBC 12.7 (H) 03/14/2016   HGB 10.6 (L) 03/14/2016   HCT 31.8 (L) 03/14/2016   MCV 95.2 03/14/2016   PLT 146 (L) 03/14/2016   Lab Results  Component Value Date   CREATININE 0.78 07/13/2018   BUN 19 07/13/2018   NA 141 07/13/2018   K 4.5 07/13/2018   CL 104 07/13/2018   CO2 23 07/13/2018   Lab Results  Component Value Date  ALT 20 02/29/2016   AST 23 02/29/2016   ALKPHOS 53 02/29/2016   BILITOT 0.2 (L) 02/29/2016   Lab Results  Component Value Date   CHOL 254 (H) 06/12/2014   HDL 84 06/12/2014   LDLCALC 153 (H) 06/12/2014   TRIG 84 06/12/2014   CHOLHDL 3.0 06/12/2014    No results found for: "HGBA1C"   Assessment & Plan    1.  History of SVT: -Today patient reports*** -Patient currently managed with propafenone and Toprol-XL  2.  Prolonged QT: -Patient noted to have previous prolonged QT on EKG and today has***  3.  Hyperlipidemia: -Patient's last LDL cholesterol was*** -Currently followed by PCP  4.  Obstructive sleep apnea: -Patient currently tolerating CPAP***      Disposition: Follow-up with Lance Muss, MD or APP in *** months {Are you ordering a CV Procedure (e.g.  stress test, cath, DCCV, TEE, etc)?   Press F2        :[id number]}   Medication Adjustments/Labs and Tests Ordered: Current medicines are reviewed at length with the patient today.  Concerns regarding medicines are outlined above.   Signed, Napoleon Form, Leodis Rains, NP 11/02/2022, 6:02 PM [facility]

## 2022-11-03 ENCOUNTER — Ambulatory Visit: Payer: Commercial Managed Care - PPO | Attending: Nurse Practitioner | Admitting: Nurse Practitioner

## 2022-11-03 ENCOUNTER — Encounter: Payer: Self-pay | Admitting: Nurse Practitioner

## 2022-11-03 VITALS — BP 128/78 | HR 85 | Ht 67.0 in | Wt 188.0 lb

## 2022-11-03 DIAGNOSIS — E039 Hypothyroidism, unspecified: Secondary | ICD-10-CM

## 2022-11-03 DIAGNOSIS — G4733 Obstructive sleep apnea (adult) (pediatric): Secondary | ICD-10-CM | POA: Diagnosis not present

## 2022-11-03 DIAGNOSIS — R030 Elevated blood-pressure reading, without diagnosis of hypertension: Secondary | ICD-10-CM

## 2022-11-03 DIAGNOSIS — I471 Supraventricular tachycardia, unspecified: Secondary | ICD-10-CM

## 2022-11-03 DIAGNOSIS — R9431 Abnormal electrocardiogram [ECG] [EKG]: Secondary | ICD-10-CM | POA: Diagnosis not present

## 2022-11-03 NOTE — Patient Instructions (Signed)
Medication Instructions:  Your physician recommends that you continue on your current medications as directed. Please refer to the Current Medication list given to you today. *If you need a refill on your cardiac medications before your next appointment, please call your pharmacy*   Lab Work: None Ordered If you have labs (blood work) drawn today and your tests are completely normal, you will receive your results only by: MyChart Message (if you have MyChart) OR A paper copy in the mail If you have any lab test that is abnormal or we need to change your treatment, we will call you to review the results.   Testing/Procedures: None Ordered   Follow-Up: At [facility], you and your health needs are our priority.  As part of our continuing mission to provide you with exceptional heart care, we have created designated Provider Care Teams.  These Care Teams include your primary Cardiologist (physician) and Advanced Practice Providers (APPs -  Physician Assistants and Nurse Practitioners) who all work together to provide you with the care you need, when you need it.  We recommend signing up for the patient portal called "MyChart".  Sign up information is provided on this After Visit Summary.  MyChart is used to connect with patients for Virtual Visits (Telemedicine).  Patients are able to view lab/test results, encounter notes, upcoming appointments, etc.  Non-urgent messages can be sent to your provider as well.   To learn more about what you can do with MyChart, go to [email or web address].    Your next appointment:   12 month(s)  Provider:   Jayadeep Varanasi, MD     Other Instructions   

## 2022-11-05 ENCOUNTER — Other Ambulatory Visit (HOSPITAL_COMMUNITY): Payer: Self-pay

## 2022-11-06 ENCOUNTER — Other Ambulatory Visit (HOSPITAL_COMMUNITY): Payer: Self-pay

## 2022-11-06 MED ORDER — ALPRAZOLAM 0.25 MG PO TABS
0.2500 mg | ORAL_TABLET | Freq: Every day | ORAL | 2 refills | Status: DC | PRN
  Filled 2022-11-06: qty 30, 30d supply, fill #0
  Filled 2022-12-16: qty 30, 30d supply, fill #1
  Filled 2023-02-23: qty 30, 30d supply, fill #2

## 2022-11-07 ENCOUNTER — Other Ambulatory Visit (HOSPITAL_COMMUNITY): Payer: Self-pay

## 2022-11-10 ENCOUNTER — Other Ambulatory Visit (HOSPITAL_COMMUNITY): Payer: Self-pay

## 2022-11-12 ENCOUNTER — Other Ambulatory Visit (HOSPITAL_COMMUNITY): Payer: Self-pay

## 2022-11-14 ENCOUNTER — Other Ambulatory Visit (HOSPITAL_COMMUNITY): Payer: Self-pay

## 2022-11-17 DIAGNOSIS — Z1231 Encounter for screening mammogram for malignant neoplasm of breast: Secondary | ICD-10-CM | POA: Diagnosis not present

## 2022-11-25 ENCOUNTER — Other Ambulatory Visit (HOSPITAL_COMMUNITY): Payer: Self-pay

## 2022-11-25 MED ORDER — ROSUVASTATIN CALCIUM 10 MG PO TABS
10.0000 mg | ORAL_TABLET | Freq: Every day | ORAL | 1 refills | Status: DC
  Filled 2022-11-25 – 2023-01-12 (×2): qty 90, 90d supply, fill #0
  Filled 2023-04-20: qty 90, 90d supply, fill #1

## 2022-11-26 ENCOUNTER — Other Ambulatory Visit (HOSPITAL_COMMUNITY): Payer: Self-pay

## 2022-11-26 MED ORDER — OZEMPIC (2 MG/DOSE) 8 MG/3ML ~~LOC~~ SOPN
2.0000 mg | PEN_INJECTOR | SUBCUTANEOUS | 2 refills | Status: DC
  Filled 2022-11-26: qty 3, 28d supply, fill #0

## 2022-12-08 ENCOUNTER — Other Ambulatory Visit (HOSPITAL_COMMUNITY): Payer: Self-pay

## 2022-12-09 DIAGNOSIS — S52125D Nondisplaced fracture of head of left radius, subsequent encounter for closed fracture with routine healing: Secondary | ICD-10-CM | POA: Diagnosis not present

## 2022-12-16 ENCOUNTER — Other Ambulatory Visit (HOSPITAL_COMMUNITY): Payer: Self-pay

## 2022-12-26 ENCOUNTER — Other Ambulatory Visit (HOSPITAL_COMMUNITY): Payer: Self-pay

## 2023-01-05 ENCOUNTER — Other Ambulatory Visit (HOSPITAL_COMMUNITY): Payer: Self-pay

## 2023-01-05 MED ORDER — GOLYTELY 236 G PO SOLR
4000.0000 mL | ORAL | 0 refills | Status: DC
  Filled 2023-01-05: qty 4000, 1d supply, fill #0

## 2023-01-12 ENCOUNTER — Other Ambulatory Visit (HOSPITAL_COMMUNITY): Payer: Self-pay

## 2023-01-16 ENCOUNTER — Other Ambulatory Visit (HOSPITAL_COMMUNITY): Payer: Self-pay

## 2023-01-19 ENCOUNTER — Other Ambulatory Visit: Payer: Self-pay | Admitting: Physician Assistant

## 2023-01-19 ENCOUNTER — Other Ambulatory Visit (HOSPITAL_BASED_OUTPATIENT_CLINIC_OR_DEPARTMENT_OTHER): Payer: Self-pay

## 2023-01-19 ENCOUNTER — Other Ambulatory Visit (HOSPITAL_COMMUNITY): Payer: Self-pay

## 2023-01-19 DIAGNOSIS — D125 Benign neoplasm of sigmoid colon: Secondary | ICD-10-CM | POA: Diagnosis not present

## 2023-01-19 DIAGNOSIS — K6389 Other specified diseases of intestine: Secondary | ICD-10-CM | POA: Diagnosis not present

## 2023-01-19 DIAGNOSIS — D120 Benign neoplasm of cecum: Secondary | ICD-10-CM | POA: Diagnosis not present

## 2023-01-19 DIAGNOSIS — Z1211 Encounter for screening for malignant neoplasm of colon: Secondary | ICD-10-CM | POA: Diagnosis not present

## 2023-01-19 DIAGNOSIS — K635 Polyp of colon: Secondary | ICD-10-CM | POA: Diagnosis not present

## 2023-01-19 DIAGNOSIS — K5730 Diverticulosis of large intestine without perforation or abscess without bleeding: Secondary | ICD-10-CM | POA: Diagnosis not present

## 2023-01-19 DIAGNOSIS — Z86010 Personal history of colonic polyps: Secondary | ICD-10-CM | POA: Diagnosis not present

## 2023-01-19 DIAGNOSIS — K633 Ulcer of intestine: Secondary | ICD-10-CM | POA: Diagnosis not present

## 2023-01-19 MED ORDER — METOPROLOL SUCCINATE ER 50 MG PO TB24
50.0000 mg | ORAL_TABLET | Freq: Every day | ORAL | 2 refills | Status: DC
  Filled 2023-01-19: qty 135, 90d supply, fill #0
  Filled 2023-04-20: qty 135, 90d supply, fill #1
  Filled 2023-10-13: qty 135, 90d supply, fill #2

## 2023-01-20 DIAGNOSIS — G4733 Obstructive sleep apnea (adult) (pediatric): Secondary | ICD-10-CM | POA: Diagnosis not present

## 2023-01-23 ENCOUNTER — Other Ambulatory Visit (HOSPITAL_COMMUNITY): Payer: Self-pay

## 2023-01-23 MED ORDER — ZOLPIDEM TARTRATE 10 MG PO TABS
10.0000 mg | ORAL_TABLET | Freq: Every evening | ORAL | 0 refills | Status: DC | PRN
Start: 2023-01-22 — End: 2024-01-14
  Filled 2023-01-23: qty 30, 30d supply, fill #0

## 2023-01-26 ENCOUNTER — Other Ambulatory Visit (HOSPITAL_COMMUNITY): Payer: Self-pay

## 2023-01-26 MED ORDER — ZOLPIDEM TARTRATE 10 MG PO TABS
10.0000 mg | ORAL_TABLET | Freq: Every evening | ORAL | 2 refills | Status: DC | PRN
Start: 2023-01-26 — End: 2023-05-22
  Filled 2023-02-23: qty 30, 30d supply, fill #0
  Filled 2023-03-24: qty 30, 30d supply, fill #1
  Filled 2023-04-24: qty 30, 30d supply, fill #2

## 2023-01-27 ENCOUNTER — Other Ambulatory Visit (HOSPITAL_COMMUNITY): Payer: Self-pay

## 2023-01-28 ENCOUNTER — Other Ambulatory Visit: Payer: Self-pay

## 2023-02-11 ENCOUNTER — Other Ambulatory Visit (HOSPITAL_COMMUNITY): Payer: Self-pay

## 2023-02-17 ENCOUNTER — Other Ambulatory Visit (HOSPITAL_COMMUNITY): Payer: Self-pay

## 2023-02-17 ENCOUNTER — Other Ambulatory Visit: Payer: Self-pay | Admitting: Interventional Cardiology

## 2023-02-17 MED ORDER — PROPAFENONE HCL ER 225 MG PO CP12
225.0000 mg | ORAL_CAPSULE | Freq: Two times a day (BID) | ORAL | 2 refills | Status: DC
  Filled 2023-02-17: qty 120, 60d supply, fill #0
  Filled 2023-04-20: qty 120, 60d supply, fill #1
  Filled 2023-06-22: qty 120, 60d supply, fill #2
  Filled 2023-08-18: qty 120, 60d supply, fill #3
  Filled 2023-10-19 – 2023-10-23 (×3): qty 60, 30d supply, fill #4

## 2023-02-20 DIAGNOSIS — G4733 Obstructive sleep apnea (adult) (pediatric): Secondary | ICD-10-CM | POA: Diagnosis not present

## 2023-02-23 ENCOUNTER — Other Ambulatory Visit (HOSPITAL_COMMUNITY): Payer: Self-pay

## 2023-03-22 DIAGNOSIS — G4733 Obstructive sleep apnea (adult) (pediatric): Secondary | ICD-10-CM | POA: Diagnosis not present

## 2023-04-20 ENCOUNTER — Other Ambulatory Visit (HOSPITAL_COMMUNITY): Payer: Self-pay

## 2023-04-21 ENCOUNTER — Other Ambulatory Visit (HOSPITAL_COMMUNITY): Payer: Self-pay

## 2023-04-22 ENCOUNTER — Other Ambulatory Visit (HOSPITAL_COMMUNITY): Payer: Self-pay

## 2023-04-24 ENCOUNTER — Other Ambulatory Visit (HOSPITAL_COMMUNITY): Payer: Self-pay

## 2023-05-20 ENCOUNTER — Other Ambulatory Visit (HOSPITAL_COMMUNITY): Payer: Self-pay

## 2023-05-22 ENCOUNTER — Other Ambulatory Visit (HOSPITAL_COMMUNITY): Payer: Self-pay

## 2023-05-22 MED ORDER — ZOLPIDEM TARTRATE 10 MG PO TABS
10.0000 mg | ORAL_TABLET | Freq: Every day | ORAL | 2 refills | Status: DC
Start: 2023-05-21 — End: 2023-08-24
  Filled 2023-05-22: qty 30, 30d supply, fill #0
  Filled 2023-06-22: qty 30, 30d supply, fill #1
  Filled 2023-07-22: qty 30, 30d supply, fill #2

## 2023-05-25 ENCOUNTER — Other Ambulatory Visit (HOSPITAL_COMMUNITY): Payer: Self-pay

## 2023-05-25 MED ORDER — ALPRAZOLAM 0.25 MG PO TABS
0.2500 mg | ORAL_TABLET | Freq: Every day | ORAL | 1 refills | Status: DC | PRN
Start: 2023-05-25 — End: 2023-10-13
  Filled 2023-05-25: qty 30, 30d supply, fill #0
  Filled 2023-08-17: qty 30, 30d supply, fill #1

## 2023-05-26 ENCOUNTER — Other Ambulatory Visit (HOSPITAL_COMMUNITY): Payer: Self-pay

## 2023-06-22 ENCOUNTER — Other Ambulatory Visit (HOSPITAL_COMMUNITY): Payer: Self-pay

## 2023-06-22 ENCOUNTER — Other Ambulatory Visit: Payer: Self-pay

## 2023-06-23 ENCOUNTER — Other Ambulatory Visit: Payer: Self-pay

## 2023-07-07 ENCOUNTER — Other Ambulatory Visit (HOSPITAL_COMMUNITY): Payer: Self-pay

## 2023-07-07 MED ORDER — AMOXICILLIN 500 MG PO CAPS
2000.0000 mg | ORAL_CAPSULE | ORAL | 2 refills | Status: AC | PRN
  Filled 2023-07-07: qty 4, 1d supply, fill #0
  Filled 2024-03-23: qty 4, 1d supply, fill #1

## 2023-07-16 ENCOUNTER — Other Ambulatory Visit (HOSPITAL_COMMUNITY): Payer: Self-pay

## 2023-07-22 ENCOUNTER — Other Ambulatory Visit: Payer: Self-pay

## 2023-07-22 ENCOUNTER — Other Ambulatory Visit (HOSPITAL_COMMUNITY): Payer: Self-pay

## 2023-08-10 ENCOUNTER — Other Ambulatory Visit (HOSPITAL_COMMUNITY): Payer: Self-pay

## 2023-08-10 DIAGNOSIS — E663 Overweight: Secondary | ICD-10-CM | POA: Diagnosis not present

## 2023-08-10 DIAGNOSIS — I10 Essential (primary) hypertension: Secondary | ICD-10-CM | POA: Diagnosis not present

## 2023-08-10 DIAGNOSIS — Z0001 Encounter for general adult medical examination with abnormal findings: Secondary | ICD-10-CM | POA: Diagnosis not present

## 2023-08-10 DIAGNOSIS — Z13820 Encounter for screening for osteoporosis: Secondary | ICD-10-CM | POA: Diagnosis not present

## 2023-08-10 DIAGNOSIS — Z23 Encounter for immunization: Secondary | ICD-10-CM | POA: Diagnosis not present

## 2023-08-10 DIAGNOSIS — R7303 Prediabetes: Secondary | ICD-10-CM | POA: Diagnosis not present

## 2023-08-10 DIAGNOSIS — M8580 Other specified disorders of bone density and structure, unspecified site: Secondary | ICD-10-CM | POA: Diagnosis not present

## 2023-08-10 DIAGNOSIS — Z1329 Encounter for screening for other suspected endocrine disorder: Secondary | ICD-10-CM | POA: Diagnosis not present

## 2023-08-10 DIAGNOSIS — Z7982 Long term (current) use of aspirin: Secondary | ICD-10-CM | POA: Diagnosis not present

## 2023-08-10 DIAGNOSIS — J301 Allergic rhinitis due to pollen: Secondary | ICD-10-CM | POA: Diagnosis not present

## 2023-08-10 DIAGNOSIS — G4700 Insomnia, unspecified: Secondary | ICD-10-CM | POA: Diagnosis not present

## 2023-08-10 DIAGNOSIS — E785 Hyperlipidemia, unspecified: Secondary | ICD-10-CM | POA: Diagnosis not present

## 2023-08-10 DIAGNOSIS — I4891 Unspecified atrial fibrillation: Secondary | ICD-10-CM | POA: Diagnosis not present

## 2023-08-10 DIAGNOSIS — D6869 Other thrombophilia: Secondary | ICD-10-CM | POA: Diagnosis not present

## 2023-08-10 DIAGNOSIS — Z853 Personal history of malignant neoplasm of breast: Secondary | ICD-10-CM | POA: Diagnosis not present

## 2023-08-10 DIAGNOSIS — G4733 Obstructive sleep apnea (adult) (pediatric): Secondary | ICD-10-CM | POA: Diagnosis not present

## 2023-08-10 DIAGNOSIS — Z1239 Encounter for other screening for malignant neoplasm of breast: Secondary | ICD-10-CM | POA: Diagnosis not present

## 2023-08-10 DIAGNOSIS — E559 Vitamin D deficiency, unspecified: Secondary | ICD-10-CM | POA: Diagnosis not present

## 2023-08-10 DIAGNOSIS — G8929 Other chronic pain: Secondary | ICD-10-CM | POA: Diagnosis not present

## 2023-08-10 DIAGNOSIS — E039 Hypothyroidism, unspecified: Secondary | ICD-10-CM | POA: Diagnosis not present

## 2023-08-10 DIAGNOSIS — E7849 Other hyperlipidemia: Secondary | ICD-10-CM | POA: Diagnosis not present

## 2023-08-10 DIAGNOSIS — Z6831 Body mass index (BMI) 31.0-31.9, adult: Secondary | ICD-10-CM | POA: Diagnosis not present

## 2023-08-10 MED ORDER — ROSUVASTATIN CALCIUM 10 MG PO TABS
10.0000 mg | ORAL_TABLET | Freq: Every day | ORAL | 1 refills | Status: DC
  Filled 2023-08-10: qty 90, 90d supply, fill #0
  Filled 2023-11-18: qty 90, 90d supply, fill #1

## 2023-08-17 ENCOUNTER — Other Ambulatory Visit (HOSPITAL_COMMUNITY): Payer: Self-pay

## 2023-08-18 ENCOUNTER — Other Ambulatory Visit (HOSPITAL_COMMUNITY): Payer: Self-pay

## 2023-08-19 ENCOUNTER — Other Ambulatory Visit (HOSPITAL_COMMUNITY): Payer: Self-pay

## 2023-08-20 ENCOUNTER — Other Ambulatory Visit (HOSPITAL_COMMUNITY): Payer: Self-pay

## 2023-08-21 ENCOUNTER — Other Ambulatory Visit (HOSPITAL_COMMUNITY): Payer: Self-pay

## 2023-08-24 ENCOUNTER — Other Ambulatory Visit (HOSPITAL_COMMUNITY): Payer: Self-pay

## 2023-08-24 MED ORDER — ZOLPIDEM TARTRATE 10 MG PO TABS
10.0000 mg | ORAL_TABLET | Freq: Every evening | ORAL | 2 refills | Status: DC | PRN
  Filled 2023-08-24: qty 30, 30d supply, fill #0
  Filled 2023-09-23: qty 30, 30d supply, fill #1
  Filled 2023-10-23: qty 30, 30d supply, fill #2

## 2023-09-01 DIAGNOSIS — E559 Vitamin D deficiency, unspecified: Secondary | ICD-10-CM | POA: Diagnosis not present

## 2023-09-01 DIAGNOSIS — E7849 Other hyperlipidemia: Secondary | ICD-10-CM | POA: Diagnosis not present

## 2023-09-01 DIAGNOSIS — R7303 Prediabetes: Secondary | ICD-10-CM | POA: Diagnosis not present

## 2023-09-01 DIAGNOSIS — Z1329 Encounter for screening for other suspected endocrine disorder: Secondary | ICD-10-CM | POA: Diagnosis not present

## 2023-09-01 DIAGNOSIS — Z0001 Encounter for general adult medical examination with abnormal findings: Secondary | ICD-10-CM | POA: Diagnosis not present

## 2023-09-24 ENCOUNTER — Other Ambulatory Visit: Payer: Self-pay

## 2023-10-13 ENCOUNTER — Other Ambulatory Visit: Payer: Self-pay

## 2023-10-13 ENCOUNTER — Other Ambulatory Visit (HOSPITAL_COMMUNITY): Payer: Self-pay

## 2023-10-13 MED ORDER — LEVOTHYROXINE SODIUM 50 MCG PO TABS
50.0000 ug | ORAL_TABLET | Freq: Every day | ORAL | 1 refills | Status: DC
  Filled 2023-10-13: qty 90, 90d supply, fill #0
  Filled 2024-01-15: qty 90, 90d supply, fill #1

## 2023-10-13 MED ORDER — ALPRAZOLAM 0.25 MG PO TABS
0.2500 mg | ORAL_TABLET | Freq: Every day | ORAL | 0 refills | Status: DC | PRN
  Filled 2023-10-13: qty 30, 30d supply, fill #0

## 2023-10-20 ENCOUNTER — Other Ambulatory Visit: Payer: Self-pay

## 2023-10-20 ENCOUNTER — Other Ambulatory Visit (HOSPITAL_COMMUNITY): Payer: Self-pay

## 2023-10-21 ENCOUNTER — Other Ambulatory Visit (HOSPITAL_COMMUNITY): Payer: Self-pay

## 2023-10-23 ENCOUNTER — Other Ambulatory Visit (HOSPITAL_COMMUNITY): Payer: Self-pay

## 2023-10-26 DIAGNOSIS — F419 Anxiety disorder, unspecified: Secondary | ICD-10-CM | POA: Diagnosis not present

## 2023-10-26 DIAGNOSIS — N952 Postmenopausal atrophic vaginitis: Secondary | ICD-10-CM | POA: Diagnosis not present

## 2023-10-26 DIAGNOSIS — F5104 Psychophysiologic insomnia: Secondary | ICD-10-CM | POA: Diagnosis not present

## 2023-10-26 DIAGNOSIS — Z124 Encounter for screening for malignant neoplasm of cervix: Secondary | ICD-10-CM | POA: Diagnosis not present

## 2023-11-18 ENCOUNTER — Other Ambulatory Visit: Payer: Self-pay | Admitting: Interventional Cardiology

## 2023-11-20 ENCOUNTER — Other Ambulatory Visit (HOSPITAL_COMMUNITY): Payer: Self-pay

## 2023-11-20 MED ORDER — PROPAFENONE HCL ER 225 MG PO CP12
225.0000 mg | ORAL_CAPSULE | Freq: Two times a day (BID) | ORAL | 0 refills | Status: DC
  Filled 2023-11-20: qty 30, 15d supply, fill #0

## 2023-11-23 ENCOUNTER — Other Ambulatory Visit (HOSPITAL_COMMUNITY): Payer: Self-pay

## 2023-11-23 MED ORDER — ZOLPIDEM TARTRATE 10 MG PO TABS
10.0000 mg | ORAL_TABLET | Freq: Every evening | ORAL | 2 refills | Status: DC | PRN
  Filled 2023-11-23: qty 30, 30d supply, fill #0
  Filled 2023-12-23: qty 30, 30d supply, fill #1
  Filled 2024-01-22: qty 30, 30d supply, fill #2

## 2023-11-30 DIAGNOSIS — G4733 Obstructive sleep apnea (adult) (pediatric): Secondary | ICD-10-CM | POA: Diagnosis not present

## 2023-12-08 ENCOUNTER — Other Ambulatory Visit (HOSPITAL_COMMUNITY): Payer: Self-pay

## 2023-12-08 ENCOUNTER — Other Ambulatory Visit: Payer: Self-pay | Admitting: Nurse Practitioner

## 2023-12-08 MED ORDER — PROPAFENONE HCL ER 225 MG PO CP12
225.0000 mg | ORAL_CAPSULE | Freq: Two times a day (BID) | ORAL | 0 refills | Status: DC
  Filled 2023-12-08 – 2023-12-11 (×2): qty 60, 30d supply, fill #0

## 2023-12-09 ENCOUNTER — Telehealth: Payer: Self-pay | Admitting: Nurse Practitioner

## 2023-12-09 ENCOUNTER — Other Ambulatory Visit (HOSPITAL_COMMUNITY): Payer: Self-pay

## 2023-12-09 MED ORDER — PROPAFENONE HCL ER 225 MG PO CP12: 225.0000 mg | ORAL_CAPSULE | Freq: Two times a day (BID) | ORAL | 1 refills | Status: DC

## 2023-12-09 NOTE — Telephone Encounter (Signed)
 Pt's medication was sent to pt's pharmacy as requested. Confirmation received.

## 2023-12-09 NOTE — Telephone Encounter (Signed)
*  STAT* If patient is at the pharmacy, call can be transferred to refill team.   1. Which medications need to be refilled? (please list name of each medication and dose if known) propafenone  (RYTHMOL  SR) 225 MG 12 hr capsule    2. Would you like to learn more about the convenience, safety, & potential cost savings by using the [facility]? NO   3. Are you open to using the [facility] Yes   4. Which pharmacy/location (including street and city if local pharmacy) is medication to be sent to? [place] [facility]    5. Do they need a 30 day or 90 day supply? 30  Patients appt with Jackee Alberts, NP got cancelled for 07/18, provider had emergency come up and had to be out. Appt r/s for 08/19

## 2023-12-10 ENCOUNTER — Other Ambulatory Visit (HOSPITAL_COMMUNITY): Payer: Self-pay

## 2023-12-11 ENCOUNTER — Ambulatory Visit: Payer: Self-pay | Admitting: Nurse Practitioner

## 2023-12-11 ENCOUNTER — Other Ambulatory Visit (HOSPITAL_COMMUNITY): Payer: Self-pay

## 2023-12-14 ENCOUNTER — Other Ambulatory Visit (HOSPITAL_COMMUNITY): Payer: Self-pay

## 2023-12-14 MED ORDER — ALPRAZOLAM 0.25 MG PO TABS
0.2500 mg | ORAL_TABLET | Freq: Every day | ORAL | 1 refills | Status: DC | PRN
  Filled 2023-12-14: qty 30, 30d supply, fill #0
  Filled 2024-01-15: qty 30, 30d supply, fill #1

## 2023-12-23 ENCOUNTER — Other Ambulatory Visit (HOSPITAL_COMMUNITY): Payer: Self-pay

## 2023-12-29 DIAGNOSIS — Z1231 Encounter for screening mammogram for malignant neoplasm of breast: Secondary | ICD-10-CM | POA: Diagnosis not present

## 2023-12-29 DIAGNOSIS — M85852 Other specified disorders of bone density and structure, left thigh: Secondary | ICD-10-CM | POA: Diagnosis not present

## 2023-12-29 DIAGNOSIS — M85851 Other specified disorders of bone density and structure, right thigh: Secondary | ICD-10-CM | POA: Diagnosis not present

## 2024-01-11 ENCOUNTER — Other Ambulatory Visit: Payer: Self-pay | Admitting: Nurse Practitioner

## 2024-01-11 NOTE — Progress Notes (Unsigned)
 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   Chronic anxiety    takes Xanax  daily as needed when heart rate going up   Colitis    hx-one   Hyperlipidemia    Insomnia    takes Ambien  nightly as needed   Overweight (BMI 25.0-29.9) 12/10/2016   PSVT (paroxysmal supraventricular tachycardia)    S/P radiation therapy 03/28/2014 through 04/27/2014                                03/28/2014 through 04/27/2014                                                                         Right breast 4250 cGy 17 sessions, right breast boost 1000 cGy in 4 sessions (hypofractionated)                          Sleep apnea    uses a CPAP    History of Present Illness  Pamela Underwood  is a 68 year old female with a PMH of SVT, HLD, breast CA s/p surgery and radiation therapy, obesity, hypothyroidism, OSA (on CPAP) who presents today for 1 year follow-up.   Pamela Underwood was last seen on 11/03/2022 for annual follow-up.  During follow-up patient was doing well with no new cardiac complaints.  She was losing weight on GLP-1 and tolerating medication without any adverse reactions.  Pamela Underwood presents today for annual follow-up.  She reports since her previous follow-up experiencing occasional episodes of atrial tachycardia that resolves with as needed Toprol -XL.  She denies any new cardiac complaints such as chest pain or shortness of breath.  She has been compliant with her current medications and denies any adverse reactions.  She reports compliance with her CPAP and is interested in completing another sleep study to see if sleep apnea has resolved with  weight loss.   During today's visit an annual EKG was completed that revealed a new left bundle branch block.  We discussed the pathophysiology and risks with her current medication regimen with plan to discontinue medicine and referred to EP for further evaluation. Patient denies chest pain, palpitations, dyspnea, PND, orthopnea, nausea, vomiting, dizziness, syncope, edema, weight gain, or early satiety.   Discussed the use of AI scribe software for clinical note transcription with the patient, who gave verbal consent to proceed.  History of Present Illness   Review of Systems  Please see the history of present illness.    All other systems reviewed and are otherwise negative except as noted above.  Physical Exam    Wt Readings from Last 3 Encounters:  11/03/22 188 lb (85.3 kg)  10/31/21 208 lb (94.3 kg)  09/04/20 206 lb 6.4 oz (93.6 kg)  CD:Uyzmz were no vitals filed for this visit.,There is no height or weight on file to calculate BMI. GEN: Well nourished, well developed in no acute distress Neck: No JVD; No carotid bruits Pulmonary: Clear to auscultation without rales, wheezing or rhonchi  Cardiovascular: Normal rate. Regular rhythm. Normal S1. Normal S2.   Murmurs: There is no murmur.  ABDOMEN: Soft, non-tender, non-distended EXTREMITIES:  No edema; No deformity   EKG/LABS/ Recent Cardiac Studies   ECG personally reviewed by me today -sinus rhythm with left axis deviation and LBBB with a rate of 80 bpm prolonged QT interval of 509 ms  Risk Assessment/Calculations:          Lab Results  Component Value Date   WBC 12.7 (H) 03/14/2016   HGB 10.6 (L) 03/14/2016   HCT 31.8 (L) 03/14/2016   MCV 95.2 03/14/2016   PLT 146 (L) 03/14/2016   Lab Results  Component Value Date   CREATININE 0.78 07/13/2018   BUN 19 07/13/2018   NA 141 07/13/2018   K 4.5 07/13/2018   CL 104 07/13/2018   CO2 23 07/13/2018   Lab Results  Component Value Date   CHOL 254 (H) 06/12/2014    HDL 84 06/12/2014   LDLCALC 153 (H) 06/12/2014   TRIG 84 06/12/2014   CHOLHDL 3.0 06/12/2014    No results found for: HGBA1C Assessment & Plan    Assessment and Plan Assessment & Plan   1.History of SVT: - EKG completed today revealing new left bundle branch block with prolonged QT interval -Patient will complete updated 2D echo - Case was discussed with EP team with advisement to discontinue Rythmol  SR 225 mg - Patient will continue Toprol -XL 50 mg in the a.m. and 25 mg in the p.m. - We will refer patient to EP to discuss further management of SVT inclusive of possible ablation and medications if indicated  2.Prolonged QT: -QT interval prolonged at 509 ms with shared decision to discontinue Rythmol  SR at this time  3.Obstructive sleep apnea: -Patient currently tolerating CPAP with no difficulty and is compliant - We will refer patient to Dr. Shlomo for advisement and possible repeat sleep study.  4.Hyperlipidemia: -Currently followed by PCP Continue Crestor  10 mg daily  5. hypothyroidism: -Continue current therapy per PCP  Disposition: Follow-up with Dr. Shlomo or APP in 2 months    Signed, Wyn Raddle, Jackee Shove, NP 01/11/2024, 1:21 PM [facility]

## 2024-01-12 ENCOUNTER — Other Ambulatory Visit: Payer: Self-pay

## 2024-01-12 ENCOUNTER — Ambulatory Visit: Payer: Self-pay | Attending: Nurse Practitioner | Admitting: Nurse Practitioner

## 2024-01-12 ENCOUNTER — Other Ambulatory Visit (HOSPITAL_COMMUNITY): Payer: Self-pay

## 2024-01-12 ENCOUNTER — Encounter: Payer: Self-pay | Admitting: Nurse Practitioner

## 2024-01-12 VITALS — BP 100/80 | HR 80 | Ht 67.0 in | Wt 181.0 lb

## 2024-01-12 DIAGNOSIS — R9431 Abnormal electrocardiogram [ECG] [EKG]: Secondary | ICD-10-CM

## 2024-01-12 DIAGNOSIS — R030 Elevated blood-pressure reading, without diagnosis of hypertension: Secondary | ICD-10-CM | POA: Diagnosis not present

## 2024-01-12 DIAGNOSIS — I471 Supraventricular tachycardia, unspecified: Secondary | ICD-10-CM | POA: Diagnosis not present

## 2024-01-12 DIAGNOSIS — E039 Hypothyroidism, unspecified: Secondary | ICD-10-CM | POA: Diagnosis not present

## 2024-01-12 DIAGNOSIS — G4733 Obstructive sleep apnea (adult) (pediatric): Secondary | ICD-10-CM

## 2024-01-12 MED ORDER — PROPAFENONE HCL ER 225 MG PO CP12
225.0000 mg | ORAL_CAPSULE | Freq: Two times a day (BID) | ORAL | 0 refills | Status: DC
  Filled 2024-01-12: qty 60, 30d supply, fill #0

## 2024-01-12 MED ORDER — METOPROLOL SUCCINATE ER 50 MG PO TB24: ORAL_TABLET | ORAL | 1 refills | Status: DC

## 2024-01-12 NOTE — Patient Instructions (Addendum)
 Medication Instructions:  STOP Propafenone  INCREASE Toprol  to 50mg  in the morning and 25mg  in the evening  *If you need a refill on your cardiac medications before your next appointment, please call your pharmacy*  Lab Work: None ordered If you have labs (blood work) drawn today and your tests are completely normal, you will receive your results only by: MyChart Message (if you have MyChart) OR A paper copy in the mail If you have any lab test that is abnormal or we need to change your treatment, we will call you to review the results.  Testing/Procedures: Your physician has requested that you have an echocardiogram. Echocardiography is a painless test that uses sound waves to create images of your heart. It provides your doctor with information about the size and shape of your heart and how well your heart's chambers and valves are working. This procedure takes approximately one hour. There are no restrictions for this procedure. Please do NOT wear cologne, perfume, aftershave, or lotions (deodorant is allowed). Please arrive 15 minutes prior to your appointment time.  Please note: We ask at that you not bring children with you during ultrasound (echo/ vascular) testing. Due to room size and safety concerns, children are not allowed in the ultrasound rooms during exams. Our front office staff cannot provide observation of children in our lobby area while testing is being conducted. An adult accompanying a patient to their appointment will only be allowed in the ultrasound room at the discretion of the ultrasound technician under special circumstances. We apologize for any inconvenience.  Follow-Up: At [facility], you and your health needs are our priority.  As part of our continuing mission to provide you with exceptional heart care, our providers are all part of one team.  This team includes your primary Cardiologist (physician) and Advanced Practice Providers or APPs (Physician  Assistants and Nurse Practitioners) who all work together to provide you with the care you need, when you need it.  Your next appointment:   First available    Provider:   Wilbert Bihari, MD  We recommend signing up for the patient portal called MyChart.  Sign up information is provided on this After Visit Summary.  MyChart is used to connect with patients for Virtual Visits (Telemedicine).  Patients are able to view lab/test results, encounter notes, upcoming appointments, etc.  Non-urgent messages can be sent to your provider as well.   To learn more about what you can do with MyChart, go to [email or web address].   Other Instructions You have been referred to EP for SVT

## 2024-01-14 ENCOUNTER — Ambulatory Visit: Attending: Cardiology | Admitting: Cardiology

## 2024-01-14 ENCOUNTER — Encounter: Payer: Self-pay | Admitting: *Deleted

## 2024-01-14 ENCOUNTER — Telehealth: Payer: Self-pay | Admitting: Cardiology

## 2024-01-14 ENCOUNTER — Other Ambulatory Visit (HOSPITAL_COMMUNITY): Payer: Self-pay

## 2024-01-14 ENCOUNTER — Encounter: Payer: Self-pay | Admitting: Cardiology

## 2024-01-14 VITALS — BP 106/72 | HR 90 | Ht 67.0 in | Wt 180.5 lb

## 2024-01-14 DIAGNOSIS — R9431 Abnormal electrocardiogram [ECG] [EKG]: Secondary | ICD-10-CM

## 2024-01-14 DIAGNOSIS — G4733 Obstructive sleep apnea (adult) (pediatric): Secondary | ICD-10-CM

## 2024-01-14 DIAGNOSIS — I471 Supraventricular tachycardia, unspecified: Secondary | ICD-10-CM

## 2024-01-14 DIAGNOSIS — R000 Tachycardia, unspecified: Secondary | ICD-10-CM

## 2024-01-14 MED ORDER — METOPROLOL SUCCINATE ER 50 MG PO TB24
50.0000 mg | ORAL_TABLET | Freq: Two times a day (BID) | ORAL | 3 refills | Status: AC
  Filled 2024-01-14: qty 180, 90d supply, fill #0
  Filled 2024-04-27: qty 180, 90d supply, fill #1

## 2024-01-14 NOTE — Telephone Encounter (Signed)
 Spoke with pt, aware of dr inocencio recommendations. Zio information sent to patient via my chart.

## 2024-01-14 NOTE — Patient Instructions (Signed)
 Medication Instructions:  Your physician has recommended you make the following change in your medication:  1-Increase Metoprolol  50 mg by mouth twice daily  *If you need a refill on your cardiac medications before your next appointment, please call your pharmacy*  Lab Work: If you have labs (blood work) drawn today and your tests are completely normal, you will receive your results only by: MyChart Message (if you have MyChart) OR A paper copy in the mail If you have any lab test that is abnormal or we need to change your treatment, we will call you to review the results.  Testing/Procedures: None ordered today.  Follow-Up: At [facility], you and your health needs are our priority.  As part of our continuing mission to provide you with exceptional heart care, our providers are all part of one team.  This team includes your primary Cardiologist (physician) and Advanced Practice Providers or APPs (Physician Assistants and Nurse Practitioners) who all work together to provide you with the care you need, when you need it.  Your next appointment:   3 month(s)  Provider:   You may see Dr. Inocencio or one of the following Advanced Practice Providers on your designated Care Team:   Charlies Arthur, PA-C Michael Andy Tillery, PA-C Suzann Riddle, NP Daphne Barrack, NP    We recommend signing up for the patient portal called MyChart.  Sign up information is provided on this After Visit Summary.  MyChart is used to connect with patients for Virtual Visits (Telemedicine).  Patients are able to view lab/test results, encounter notes, upcoming appointments, etc.  Non-urgent messages can be sent to your provider as well.   To learn more about what you can do with MyChart, go to [email or web address].

## 2024-01-14 NOTE — Telephone Encounter (Signed)
 Patient calling back to see that she does want to get the heart monitor. Please advise

## 2024-01-14 NOTE — Progress Notes (Signed)
  Electrophysiology Office Note:   Date:  01/14/2024  ID:  Pamela Underwood, DOB [birth year], MRN [id number]  Primary Cardiologist: None Primary Heart Failure: None Electrophysiologist: None      History of Present Illness:   Pamela Underwood is a 68 y.o. female with h/o SVT, hyperlipidemia, breast cancer postsurgery and radiation, obesity, hypothyroidism, sleep apnea seen today for routine electrophysiology followup.   She has episodes of atrial tachycardia.  She takes Toprol -XL on an as-needed basis.  She has lost a significant amount of weight and Gordon Carlson potentially have another sleep study.  She had an EKG done at her last cardiology visit which showed a new left bundle branch block.  Since the propafenone  was stopped, she has noted that her heart rates have been elevated.  She has some mild fatigue and feels palpitations.  She is also concerned about her elevated heart rate and she feels that it should not be as high as it is.  At times at home, heart rate is in the 110s to 120s.  She still works once a week, and has concerns working as an Programmer, multimedia with rapid heart rates.  she denies chest pain, dyspnea, PND, orthopnea, nausea, vomiting, dizziness, syncope, edema, weight gain, or early satiety.   Review of systems complete and found to be negative unless listed in HPI.   EP Information / Studies Reviewed:    EKG is not ordered today. EKG from 01/12/2024 reviewed which showed sinus rhythm       Risk Assessment/Calculations:            Physical Exam:   VS:  BP 106/72   Pulse 90   Ht 5' 7 (1.702 m)   Wt 180 lb 8 oz (81.9 kg)   SpO2 99%   BMI 28.27 kg/m    Wt Readings from Last 3 Encounters:  01/14/24 180 lb 8 oz (81.9 kg)  01/12/24 181 lb (82.1 kg)  11/03/22 188 lb (85.3 kg)     GEN: Well nourished, well developed in no acute distress NECK: No JVD; No carotid bruits CARDIAC: Regular rate and rhythm, no murmurs, rubs, gallops RESPIRATORY:  Clear to  auscultation without rales, wheezing or rhonchi  ABDOMEN: Soft, non-tender, non-distended EXTREMITIES:  No edema; No deformity   ASSESSMENT AND PLAN:    1.  SVT: On Toprol -XL.  She has had apparently an atrial tachycardia in the past.  Her heart rates are also more elevated now that she is off propafenone .  Lamonte Hartt increase Toprol -XL to 50 mg twice daily.  If she continues to have symptoms, could increase her metoprolol  more versus starting diltiazem .  2.  Obstructive sleep apnea: CPAP compliance encouraged  3.  Hyperlipidemia: Continue Crestor  per PCP  4.  Left bundle branch block: Has upcoming echo scheduled.  Follow up with Dr. Inocencio in 3 months  Signed, Talmage Teaster Gladis Inocencio, MD

## 2024-01-14 NOTE — Telephone Encounter (Signed)
 Patient calling back to say that she does want to get the heart monitor. Please advise      Forwarding to provider and covering

## 2024-01-15 ENCOUNTER — Ambulatory Visit: Attending: Cardiology

## 2024-01-15 ENCOUNTER — Other Ambulatory Visit (HOSPITAL_COMMUNITY): Payer: Self-pay

## 2024-01-15 DIAGNOSIS — R000 Tachycardia, unspecified: Secondary | ICD-10-CM

## 2024-01-15 NOTE — Progress Notes (Unsigned)
 Enrolled for [employer] to mail a ZIO XT long term holter monitor to the patients address on file.

## 2024-01-18 ENCOUNTER — Encounter: Payer: Self-pay | Admitting: Cardiology

## 2024-01-18 NOTE — Telephone Encounter (Signed)
  Patient is calling back to follow up. She is hoping to get recommendation before the day ends

## 2024-01-18 NOTE — Telephone Encounter (Signed)
  Pt is calling back to follow up. She is very concern about her elevated HR and hoping to get recommendation today

## 2024-01-19 ENCOUNTER — Other Ambulatory Visit: Payer: Self-pay

## 2024-01-19 ENCOUNTER — Emergency Department (HOSPITAL_COMMUNITY): Admission: EM | Admit: 2024-01-19 | Discharge: 2024-01-19 | Disposition: A | Discharge status: Home / Self Care

## 2024-01-19 DIAGNOSIS — I4719 Other supraventricular tachycardia: Secondary | ICD-10-CM

## 2024-01-19 DIAGNOSIS — I471 Supraventricular tachycardia, unspecified: Secondary | ICD-10-CM | POA: Diagnosis not present

## 2024-01-19 DIAGNOSIS — Z7982 Long term (current) use of aspirin: Secondary | ICD-10-CM | POA: Diagnosis not present

## 2024-01-19 DIAGNOSIS — I447 Left bundle-branch block, unspecified: Secondary | ICD-10-CM | POA: Diagnosis not present

## 2024-01-19 DIAGNOSIS — R000 Tachycardia, unspecified: Secondary | ICD-10-CM | POA: Diagnosis present

## 2024-01-19 LAB — CBC WITH DIFFERENTIAL/PLATELET
Abs Immature Granulocytes: 0.02 K/uL (ref 0.00–0.07)
Basophils Absolute: 0 K/uL (ref 0.0–0.1)
Basophils Relative: 1 %
Eosinophils Absolute: 0.4 K/uL (ref 0.0–0.5)
Eosinophils Relative: 4 %
HCT: 40.3 % (ref 36.0–46.0)
Hemoglobin: 14.1 g/dL (ref 12.0–15.0)
Immature Granulocytes: 0 %
Lymphocytes Relative: 44 %
Lymphs Abs: 3.5 K/uL (ref 0.7–4.0)
MCH: 32.6 pg (ref 26.0–34.0)
MCHC: 35 g/dL (ref 30.0–36.0)
MCV: 93.3 fL (ref 80.0–100.0)
Monocytes Absolute: 0.8 K/uL (ref 0.1–1.0)
Monocytes Relative: 10 %
Neutro Abs: 3.3 K/uL (ref 1.7–7.7)
Neutrophils Relative %: 41 %
Platelets: 204 K/uL (ref 150–400)
RBC: 4.32 MIL/uL (ref 3.87–5.11)
RDW: 11.9 % (ref 11.5–15.5)
WBC: 8.1 K/uL (ref 4.0–10.5)
nRBC: 0 % (ref 0.0–0.2)

## 2024-01-19 LAB — BASIC METABOLIC PANEL WITH GFR
Anion gap: 12 (ref 5–15)
BUN: 10 mg/dL (ref 8–23)
CO2: 24 mmol/L (ref 22–32)
Calcium: 8.9 mg/dL (ref 8.9–10.3)
Chloride: 103 mmol/L (ref 98–111)
Creatinine, Ser: 0.87 mg/dL (ref 0.44–1.00)
GFR, Estimated: >60 mL/min (ref >60)
Glucose, Bld: 110 mg/dL — ABNORMAL HIGH (ref 70–99)
Potassium: 4.1 mmol/L (ref 3.5–5.1)
Sodium: 139 mmol/L (ref 135–145)

## 2024-01-19 LAB — MAGNESIUM: Magnesium: 1.9 mg/dL (ref 1.7–2.4)

## 2024-01-19 LAB — TSH: TSH: 1.557 u[IU]/mL (ref 0.350–4.500)

## 2024-01-19 LAB — TROPONIN I (HIGH SENSITIVITY)
Troponin I (High Sensitivity): 3 ng/L (ref <18)
Troponin I (High Sensitivity): 3 ng/L (ref <18)

## 2024-01-19 MED ORDER — SODIUM CHLORIDE 0.9 % IV BOLUS
1000.0000 mL | Freq: Once | INTRAVENOUS | Status: AC
  Administered 2024-01-19: 1000 mL via INTRAVENOUS

## 2024-01-19 MED ORDER — METOPROLOL TARTRATE 5 MG/5ML IV SOLN
5.0000 mg | Freq: Once | INTRAVENOUS | Status: AC
  Administered 2024-01-19: 5 mg via INTRAVENOUS
  Filled 2024-01-19: qty 5

## 2024-01-19 NOTE — ED Triage Notes (Signed)
 Pt states she has a hx of SVT, resting HR is 125. Pt states she has been diaphoretic. Denies CP and SHOB. Pt recently stopped taking antiarrythmic medication. Axox4.

## 2024-01-19 NOTE — ED Notes (Signed)
 Pt. Verbalized understanding of her discharge instructions and was given the opportunity to ask questions if needed.

## 2024-01-19 NOTE — ED Notes (Signed)
 Pt is aware we need a urine sample, but reports she already knows what is going on and we dont need a sample.

## 2024-01-19 NOTE — ED Triage Notes (Signed)
 Pt. Stated, I've had an ongoing increase heart beat and a left bundle branch that was new with being diaphoretic while resting. Ive seen my cardiologist , he changed some medications. This became August 17. He did increase my Metropolol 50 mg. This is not helping. I called yesterday and no response so I decided to come here.

## 2024-01-19 NOTE — Consult Note (Addendum)
 ELECTROPHYSIOLOGY CONSULT NOTE    Patient ID: NIDYA BOUYER MRN: [id number], DOB/AGE: November 30, 1955 68 y.o.  Admit date: 01/19/2024 Date of Consult: 01/19/2024  Primary Physician: Roanna Ezekiel NOVAK, MD Primary Cardiologist: None  Electrophysiologist: Dr. Inocencio   Referring Provider: Dr. Simon  Patient Profile: PAYSLEE BATESON is a 68 y.o. female with a history of SVT, hyperlipidemia, breast cancer s/p surgery and radiation, obesity, hypothyroidism, sleep apnea who is being seen today for the evaluation of recurrent SVT at the request of Dr. Simon.  HPI:  RAEANNE DESCHLER is a 68 y.o. female who was recently referred to EP. She has a longstanding history of SVT, previously managed by Dr. Dann with Propafenone  225mg  BID(started 2013).   Patient recently saw Jackee Alberts, NP on 01/12/24 in clinic. At this visit, LBBB noted on ECG with suspicion for prolonged QT interval, so Propafenone  was stopped. Patient referred to EP for further management. With discontinuation, patient's Toprol  XL increased first to 50mg  AM, 25mg  PM. Patient saw Dr. Inocencio on 01/14/24 and it was already noted that she had observed increased HR off Propafenone  with fatigue and palpitations. Toprol  XL then further increased to 50mg  BID.  Despite increase, patient with worsening rate control over the past few days. She reached out to clinic yesterday with MyChart message reporting elevated HR as well. No focal symptoms reported with rapid HR, rather a general sense of malaise over the last few days. Patient presented to the ED today due to sustained rates. ECG with atrial tachycardia.  Denies palpitations, dizziness/lightheadedness, chest pain, dyspnea.   Labs Potassium4.1 (08/26 0732)   Creatinine, ser  0.87 (08/26 0732) PLT  204 (08/26 0732) HGB  14.1 (08/26 0732) WBC 8.1 (08/26 0732) Troponin I (High Sensitivity)3 (08/26 0957).    Past Medical History:  Diagnosis Date   Arthritis    Atrial  tachycardia (HCC)    Back pain    occasionally   Breast cancer (HCC) 02/22/14   right upper inner   Chronic anxiety    takes Xanax  daily as needed when heart rate going up   Colitis    hx-one   Hyperlipidemia    Insomnia    takes Ambien  nightly as needed   Overweight (BMI 25.0-29.9) 12/10/2016   PSVT (paroxysmal supraventricular tachycardia) (HCC)    S/P radiation therapy 03/28/2014 through 04/27/2014                                03/28/2014 through 04/27/2014                                                                         Right breast 4250 cGy 17 sessions, right breast boost 1000 cGy in 4 sessions (hypofractionated)                          Sleep apnea    uses a CPAP     Surgical History:  Past Surgical History:  Procedure Laterality Date   BREAST BIOPSY Left 05/02/1999   benign   BREAST LUMPECTOMY WITH AXILLARY LYMPH NODE BIOPSY Right 02/22/14   upper inner   COLONOSCOPY  CYSTOSCOPY     KNEE ARTHROSCOPY WITH MEDIAL MENISECTOMY Left 03/31/2013   Procedure: LEFT KNEE ARTHROSCOPY WITH PARTIAL MEDIAL MENISECTOMY, CHONDROPLASTY OF PATELLA  FEMORAL JOINT, EXCISION OF MEDIAL PLICA, PARTIAL LATERAL MENISECTOMY;  Surgeon: Toribio JULIANNA Chancy, MD;  Location: [facility];  Service: Orthopedics;  Laterality: Left;   TOTAL KNEE ARTHROPLASTY Left 06/29/2013   Procedure: TOTAL KNEE ARTHROPLASTY;  Surgeon: Toribio JULIANNA Chancy, MD;  Location: [facility] OR;  Service: Orthopedics;  Laterality: Left;   TOTAL KNEE ARTHROPLASTY Right 03/12/2016   TOTAL KNEE ARTHROPLASTY Right 03/12/2016   Procedure: RIGHT TOTAL KNEE ARTHROPLASTY;  Surgeon: Toribio JULIANNA Chancy, MD;  Location: [facility] OR;  Service: Orthopedics;  Laterality: Right;   TUBAL LIGATION       (Not in a hospital admission)   Inpatient Medications:   Allergies:  Allergies  Allergen Reactions   Escitalopram Oxalate Other (See Comments)    Hives, welts, all over skin rash   Cardizem   [Diltiazem  Hcl] Hives    Family History  Problem  Relation Age of Onset   Heart disease Father    Hypertension Father    Diabetes Father    Heart attack Neg Hx      Physical Exam: Vitals:   01/19/24 1030 01/19/24 1045 01/19/24 1100 01/19/24 1115  BP: (!) 100/54  109/63 113/81  Pulse: (!) 113 (!) 118 (!) 113 (!) 111  Resp: (!) 21 (!) 26 19 (!) 25  Temp:      SpO2: 98% 99% 99% 100%    GEN- NAD, A&O x 3, normal affect HEENT: Normocephalic, atraumatic Lungs- CTAB, Normal effort.  Heart- regular rhythm, rapid rate in low 100s. No M/G/R.  GI- Soft, NT, ND.  Extremities- No clubbing, cyanosis, or edema   Radiology/Studies: No results found.  ZXH:jumpjo tachycardia with HR 128 bpm (personally reviewed)  TELEMETRY: regular tachycardic rhythm with rates decreasing since admission, now low 100s. (personally reviewed)  DEVICE HISTORY: N/A  AAD Hx: Propafenone  225mg  BID started 2013, stopped 01/12/24.  Assessment/Plan:  SVT Atrial tachycardia On review of prior ECGs, patient seen with LBBB back in June of 2024. ECG in clinic on 01/12/24 without change. Stable appearing LBBB and QT segment (when corrected to account for LBBB). Today her ECG continues to demonstrate LBBB, stable QT off Propafenone . With previously well controlled symptoms on Propafenone , Treydon Henricks have patient restart 225mg  BID. Decrease Toprol  XL to 50mg  daily.  [facility] [place] arrange exercise tolerance test in the next few weeks.  Continue with plans for outpatient echocardiogram.  OSA Continue nightly CPAP.  Hyperlipidemia Continue home statin.     For questions or updates, please contact [facility] Please consult [email or web address] for contact info under     Signed, Artist Pouch, PA-C  01/19/2024, 11:25 AM       I have seen and examined this patient with Artist Pouch.  Agree with above, note added to reflect my findings.  With a history of atrial tachycardia presented to the emergency room with rapid heart rates.  She was previously taken off of her  propafenone  due to a widening of her QRS.  She has had issues with tachycardia since then.  She presents today with palpitations.  She has had heart rates at home in the 120s to 130s.  Heart rate is 110 currently and she is feeling well without major complaint.  GEN: No acute distress.   Neck: No JVD Cardiac: Tachycardic Respiratory: Normal work of breathing GI: Soft, nontender, non-distended  MS: No edema; No deformity.  Neuro:  Nonfocal  Skin: warm and dry, Psych: Normal affect    Atrial tachycardia: Patient had propafenone  stopped due to left bundle branch block.  It appears that left bundle branch block is chronic.  Her QRS has not changed since stopping her propafenone .  Tenaya Hilyer restart propafenone  to 25 mg twice daily.  Reco Shonk have her come back to clinic in 1 week for an exercise treadmill test.  If her QRS does not change during the treadmill test, we Darien Kading continue with propafenone .  Corrigan Kretschmer M. Amaranta Mehl MD 01/19/2024 5:06 PM

## 2024-01-19 NOTE — ED Notes (Signed)
 ED Provider at bedside.

## 2024-01-19 NOTE — ED Provider Notes (Signed)
 [place] EMERGENCY DEPARTMENT AT [facility] Provider Note   CSN: [id number] Arrival date & time: 01/19/24  9296     Patient presents with: Tachycardia, Abnormal ECG, and diaphoretic   Pamela Underwood is a 68 y.o. female.   HPI     Presents because of tachycardia.  Patient states that she has a history of SVT.  Patient has a long history of this.  Used to be on propafenone  but this was discontinued because of concern for new left bundle branch block.  Patient had a metoprolol  dosage increased.  Patient states that since Thursday, she has been tachycardic.  Heart rates been above 110 this whole time.  Patient states that the lowest this it has been is 115.  Denies any chest pain.  No prior chest pain or hemoptysis.  No history DVT or PE.  No exertional chest pain.  No shortness of breath.  Otherwise, she is pretty asymptomatic.  Previous medical history reviewed : Patient was last seen by cardiology in August 2025.  History of SVT.  History of atrial tachycardia. .  At her last cardiology visit, new left bundle branch block was seen.  Used to be on propafenone . Cardiology increased toprol  XL to 50 mg BID     Prior to Admission medications   Medication Sig Start Date End Date Taking? Authorizing Provider  ALPRAZolam  (XANAX ) 0.25 MG tablet Take 1 tablet (0.25 mg total) by mouth daily as needed. Patient taking differently: Take 0.25 mg by mouth daily as needed for anxiety. 12/14/23  Yes   aspirin  81 MG tablet Take 81 mg by mouth daily.   Yes [provider]  levothyroxine  (EUTHYROX ) 50 MCG tablet Take 1 tablet (50 mcg total) by mouth daily before meal. 10/13/23  Yes   metoprolol  succinate (TOPROL -XL) 50 MG 24 hr tablet Take 1 tablet (50 mg total) by mouth in the morning and at bedtime. 01/14/24  Yes Camnitz, Will Gladis, MD  rosuvastatin  (CRESTOR ) 10 MG tablet Take 1 tablet (10 mg total) by mouth daily. 08/10/23  Yes   Semaglutide , 2 MG/DOSE, (OZEMPIC , 2 MG/DOSE,) 8  MG/3ML SOPN Inject 2 mg into the skin once a week. 08/15/22  Yes   VITAMIN D, CHOLECALCIFEROL, PO Take 1,000 Units by mouth daily.    Yes [provider]  zolpidem  (AMBIEN ) 10 MG tablet Take 1 tablet (10 mg total) by mouth at bedtime as needed. Patient taking differently: Take 10 mg by mouth at bedtime as needed for sleep. 11/23/23  Yes   amoxicillin  (AMOXIL ) 500 MG capsule Take 4 capsules (2,000 mg total) by mouth 1 hour prior to appointment. Patient not taking: Reported on 01/19/2024 07/07/23       Allergies: Escitalopram oxalate and Cardizem   [diltiazem  hcl]    Review of Systems  Constitutional:  Negative for chills and fever.  HENT:  Negative for ear pain and sore throat.   Eyes:  Negative for pain and visual disturbance.  Respiratory:  Negative for cough and shortness of breath.   Cardiovascular:  Negative for chest pain and palpitations.  Gastrointestinal:  Negative for abdominal pain and vomiting.  Genitourinary:  Negative for dysuria and hematuria.  Musculoskeletal:  Negative for arthralgias and back pain.  Skin:  Negative for color change and rash.  Neurological:  Negative for seizures and syncope.  All other systems reviewed and are negative.   Updated Vital Signs BP 116/80   Pulse (!) 108   Temp 97.8 F (36.6 C) (Oral)  Resp 20   SpO2 99%   Physical Exam Vitals and nursing note reviewed.  Constitutional:      General: She is not in acute distress.    Appearance: She is well-developed.  HENT:     Head: Normocephalic and atraumatic.  Eyes:     Conjunctiva/sclera: Conjunctivae normal.  Cardiovascular:     Rate and Rhythm: Normal rate and regular rhythm.     Heart sounds: No murmur heard. Pulmonary:     Effort: Pulmonary effort is normal. No respiratory distress.     Breath sounds: Normal breath sounds.  Abdominal:     Palpations: Abdomen is soft.     Tenderness: There is no abdominal tenderness.  Musculoskeletal:        General: No swelling.      Cervical back: Neck supple.  Skin:    General: Skin is warm and dry.     Capillary Refill: Capillary refill takes less than 2 seconds.  Neurological:     Mental Status: She is alert.  Psychiatric:        Mood and Affect: Mood normal.     (all labs ordered are listed, but only abnormal results are displayed) Labs Reviewed  BASIC METABOLIC PANEL WITH GFR - Abnormal; Notable for the following components:      Result Value   Glucose, Bld 110 (*)    All other components within normal limits  CBC WITH DIFFERENTIAL/PLATELET  URINALYSIS, ROUTINE W REFLEX MICROSCOPIC  MAGNESIUM   TSH  TROPONIN I (HIGH SENSITIVITY)  TROPONIN I (HIGH SENSITIVITY)    EKG: EKG Interpretation Date/Time:  Tuesday January 19 2024 08:16:07 EDT Ventricular Rate:  128 PR Interval:    QRS Duration:  135 QT Interval:  390 QTC Calculation: 570 R Axis:   -41  Text Interpretation: Atrial tachycardia Left bundle branch block Confirmed by Simon Rea [phone]) on 01/19/2024 10:40:03 AM  Radiology: No results found.   Procedures   Medications Ordered in the ED  sodium chloride  0.9 % bolus 1,000 mL (0 mLs Intravenous Stopped 01/19/24 1222)  metoprolol  tartrate (LOPRESSOR ) injection 5 mg (5 mg Intravenous Given 01/19/24 0959)  metoprolol  tartrate (LOPRESSOR ) injection 5 mg (5 mg Intravenous Given 01/19/24 1113)    Clinical Course as of 01/19/24 1235  Tue Jan 19, 2024  0840 Tob  [TL]  1153 Keep at toprol  xl 50 mg per day.   [TL]    Clinical Course User Index [TL] Simon Rea SAILOR, MD                                 Medical Decision Making Amount and/or Complexity of Data Reviewed Labs: ordered.  Risk Prescription drug management.    Presents because of tachycardia.  Patient states that she has a history of SVT.  Patient has a long history of this.  Used to be on propafenone  but this was discontinued because of concern for new left bundle branch block.  Patient had a metoprolol  dosage increased.  Patient  states that since Thursday, she has been tachycardic.  Heart rates been above 110 this whole time.  Patient states that the lowest this it has been is 115.  Denies any chest pain.  No prior chest pain or hemoptysis.  No history DVT or PE.  No exertional chest pain.  No shortness of breath.  Otherwise, she is pretty asymptomatic.  Previous medical history reviewed : Patient was last seen by cardiology in  August 2025.  History of SVT.  History of atrial tachycardia. .  At her last cardiology visit, n   On exam, patient heart rate in the 120s.  Looks to be regular.  Some form of SVT.  Looks like he has been diagnosed with atrial tachycardia in the past.  Has not miss any of her doses of metoprolol   Gave metoprolol  5 mg IV push x 2 as well as started patient on a liter of fluid.  Heart rate improved to about 108-110.  Systolics remain appropriate.  Mentation remained at baseline.   No large electrolyte derangements.  Consulted cardiology.  Recommended patient go back on the propafenone  and to decrease metoprolol  to 50 mg daily as opposed to twice daily. They saw the patient here in the ED.  No concerns for  ACS pathology at  this point in time.  Troponin negative x 2.  No chest pain.  No shortness of breath.   No concerns for PE.  No history DVT or PE.  No clear chest pain or hemoptysis.  No risk factors.        Final diagnoses:  SVT (supraventricular tachycardia) [facility])    ED Discharge Orders     None          Simon Lavonia SAILOR, MD 01/19/24 1235

## 2024-01-19 NOTE — Discharge Instructions (Addendum)
 Please restart your Propafenone . Please take your 50 mg metoprolol  once a day as well.   If any of your symptoms worsen or change despite this change then come back to the ED.   Please follow up with your cardiologist.

## 2024-01-22 ENCOUNTER — Other Ambulatory Visit: Payer: Self-pay

## 2024-01-22 ENCOUNTER — Other Ambulatory Visit (HOSPITAL_COMMUNITY): Payer: Self-pay

## 2024-01-29 NOTE — Telephone Encounter (Addendum)
 Received a message from Jodie Passey, [place] about pt needed a ETT.  Called pt to advise/discuss. She reports feeling much improved since restarting the Rythmol , HRs are back to normal.  We discussed next steps, I also re-discussed with Jodie Passey, PA, as she would like to wait until after echocardiogram & monitor.  Patient made aware that yes she will still need an ETT if remains on this medication, but ok to wait and schedule after echocardiogram.  She is going to be out of town some in October but unsure of the exact dates (2 different weeks).  She is going to discuss this further with her son and let us  know.  Advised to send a mychart message to us , or I will call her Monday to determine when treadmill can be scheduled. Patient verbalized understanding and agreeable to plan.

## 2024-02-01 NOTE — Telephone Encounter (Signed)
 Left message to call back.

## 2024-02-04 MED ORDER — PROPAFENONE HCL ER 225 MG PO CP12: 225.0000 mg | ORAL_CAPSULE | Freq: Two times a day (BID) | ORAL | Status: AC

## 2024-02-04 NOTE — Telephone Encounter (Signed)
 Spoke to pt, she reports she will gone the week of October 6-10.  She would like to arrange the ETT for the week of 10/13 -10/17.  She would like to aim for the week after, but cannot do on 10/16. Aware scheduler would contact her to arrange.  She is agreeable to plan.

## 2024-02-08 ENCOUNTER — Encounter (HOSPITAL_COMMUNITY): Payer: Self-pay

## 2024-02-08 ENCOUNTER — Other Ambulatory Visit (HOSPITAL_COMMUNITY): Payer: Self-pay

## 2024-02-08 DIAGNOSIS — Z6831 Body mass index (BMI) 31.0-31.9, adult: Secondary | ICD-10-CM | POA: Diagnosis not present

## 2024-02-08 DIAGNOSIS — E669 Obesity, unspecified: Secondary | ICD-10-CM | POA: Diagnosis not present

## 2024-02-08 DIAGNOSIS — Z1211 Encounter for screening for malignant neoplasm of colon: Secondary | ICD-10-CM | POA: Diagnosis not present

## 2024-02-08 DIAGNOSIS — R7303 Prediabetes: Secondary | ICD-10-CM | POA: Diagnosis not present

## 2024-02-08 DIAGNOSIS — G4733 Obstructive sleep apnea (adult) (pediatric): Secondary | ICD-10-CM | POA: Diagnosis not present

## 2024-02-08 DIAGNOSIS — E039 Hypothyroidism, unspecified: Secondary | ICD-10-CM | POA: Diagnosis not present

## 2024-02-08 DIAGNOSIS — I471 Supraventricular tachycardia, unspecified: Secondary | ICD-10-CM | POA: Diagnosis not present

## 2024-02-08 DIAGNOSIS — E7849 Other hyperlipidemia: Secondary | ICD-10-CM | POA: Diagnosis not present

## 2024-02-08 MED ORDER — ZEPBOUND 2.5 MG/0.5ML ~~LOC~~ SOAJ
2.5000 mg | SUBCUTANEOUS | 3 refills | Status: AC
  Filled 2024-02-08 – 2024-04-27 (×2): qty 2, 28d supply, fill #0

## 2024-02-09 ENCOUNTER — Other Ambulatory Visit (HOSPITAL_COMMUNITY): Payer: Self-pay

## 2024-02-09 DIAGNOSIS — R000 Tachycardia, unspecified: Secondary | ICD-10-CM | POA: Diagnosis not present

## 2024-02-09 MED ORDER — ROSUVASTATIN CALCIUM 10 MG PO TABS
10.0000 mg | ORAL_TABLET | Freq: Every day | ORAL | 1 refills | Status: AC
  Filled 2024-02-09: qty 90, 90d supply, fill #0
  Filled 2024-04-27: qty 90, 90d supply, fill #1

## 2024-02-11 DIAGNOSIS — R000 Tachycardia, unspecified: Secondary | ICD-10-CM | POA: Diagnosis not present

## 2024-02-12 ENCOUNTER — Ambulatory Visit (HOSPITAL_COMMUNITY)
Admission: RE | Admit: 2024-02-12 | Discharge: 2024-02-12 | Disposition: A | Discharge status: Home / Self Care | Source: Ambulatory Visit | Attending: Nurse Practitioner | Admitting: Nurse Practitioner

## 2024-02-12 DIAGNOSIS — E039 Hypothyroidism, unspecified: Secondary | ICD-10-CM | POA: Insufficient documentation

## 2024-02-12 DIAGNOSIS — G4733 Obstructive sleep apnea (adult) (pediatric): Secondary | ICD-10-CM | POA: Insufficient documentation

## 2024-02-12 DIAGNOSIS — R030 Elevated blood-pressure reading, without diagnosis of hypertension: Secondary | ICD-10-CM | POA: Insufficient documentation

## 2024-02-12 DIAGNOSIS — R9431 Abnormal electrocardiogram [ECG] [EKG]: Secondary | ICD-10-CM | POA: Diagnosis not present

## 2024-02-12 DIAGNOSIS — I471 Supraventricular tachycardia, unspecified: Secondary | ICD-10-CM | POA: Insufficient documentation

## 2024-02-12 LAB — ECHOCARDIOGRAM COMPLETE
Area-P 1/2: 3.39 cm2
S' Lateral: 3.2 cm

## 2024-02-13 ENCOUNTER — Ambulatory Visit: Payer: Self-pay | Admitting: Nurse Practitioner

## 2024-02-13 ENCOUNTER — Ambulatory Visit: Payer: Self-pay | Admitting: Cardiology

## 2024-02-16 ENCOUNTER — Other Ambulatory Visit: Payer: Self-pay | Admitting: Nurse Practitioner

## 2024-02-16 ENCOUNTER — Other Ambulatory Visit (HOSPITAL_COMMUNITY): Payer: Self-pay

## 2024-02-16 MED ORDER — PROPAFENONE HCL ER 225 MG PO CP12
225.0000 mg | ORAL_CAPSULE | Freq: Two times a day (BID) | ORAL | 3 refills | Status: AC
  Filled 2024-02-16: qty 180, 90d supply, fill #0
  Filled 2024-02-16 (×2): qty 60, 30d supply, fill #0
  Filled 2024-03-17: qty 60, 30d supply, fill #1
  Filled 2024-04-15: qty 60, 30d supply, fill #2
  Filled 2024-05-20: qty 30, 15d supply, fill #3
  Filled 2024-05-20: qty 60, 30d supply, fill #3
  Filled 2024-05-31 – 2024-06-01 (×2): qty 30, 15d supply, fill #4
  Filled 2024-06-21: qty 30, 15d supply, fill #5

## 2024-02-22 ENCOUNTER — Other Ambulatory Visit (HOSPITAL_COMMUNITY): Payer: Self-pay

## 2024-02-22 MED ORDER — ZOLPIDEM TARTRATE 10 MG PO TABS
10.0000 mg | ORAL_TABLET | Freq: Every evening | ORAL | 2 refills | Status: DC | PRN
  Filled 2024-02-22: qty 30, 30d supply, fill #0
  Filled 2024-03-28: qty 30, 30d supply, fill #1
  Filled 2024-04-27: qty 30, 30d supply, fill #2

## 2024-02-22 MED ORDER — ALPRAZOLAM 0.25 MG PO TABS
0.2500 mg | ORAL_TABLET | Freq: Every day | ORAL | 1 refills | Status: AC | PRN
  Filled 2024-02-22: qty 30, 30d supply, fill #0
  Filled 2024-03-28: qty 30, 30d supply, fill #1

## 2024-02-29 ENCOUNTER — Encounter (HOSPITAL_COMMUNITY): Payer: Self-pay | Admitting: *Deleted

## 2024-03-09 ENCOUNTER — Ambulatory Visit (HOSPITAL_COMMUNITY)
Admission: RE | Admit: 2024-03-09 | Discharge: 2024-03-09 | Disposition: A | Discharge status: Home / Self Care | Source: Ambulatory Visit | Attending: Cardiovascular Disease | Admitting: Cardiovascular Disease

## 2024-03-09 DIAGNOSIS — I471 Supraventricular tachycardia, unspecified: Secondary | ICD-10-CM | POA: Insufficient documentation

## 2024-03-09 DIAGNOSIS — R9431 Abnormal electrocardiogram [ECG] [EKG]: Secondary | ICD-10-CM | POA: Diagnosis not present

## 2024-03-09 NOTE — Progress Notes (Unsigned)
 Cardiology Office Note   Date:  03/14/2024  ID:  Sherley, Mckenney 31-May-1955, MRN [id number] PCP: Roanna Ezekiel NOVAK, MD  [facility] Providers Cardiologist:  None Electrophysiologist:  Will Gladis Norton, MD     History of Present Illness Pamela Underwood is a 68 y.o. female with a past medical history of PSVT, OSA, dyslipidemia, history of breast cancer, LBBB  03/09/2024 ETT no exercise-induced arrhythmia noted 02/12/2024 echo EF 55 to 60%, grade 1 DD, trivial MR, borderline dilatation of ascending aorta 39 mm 02/10/2024 monitor average heart rate 80 bpm, greater than 3000 episodes of SVT >> Toprol  increased to 100 mg  She is a longstanding patient of [facility] initially established with Dr. Dann for the evaluation and management of PSVT.  Also follows with Dr. Shlomo for OSA.  Regarding her PSVT, previously controlled on propafenone  and diltiazem  and had declined ablation in the past.  She was evaluated by Dr. Norton after she had had an EKG revealing a new left bundle branch block and her propafenone  had been discontinued since that time she noticed that her heart had been racing more, initially her Toprol  was increased to 50 mg twice daily.  A monitor was arranged at this time as well revealing greater than 3000 episodes of SVT and her Toprol  was increased to 100 mg twice daily.  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  We discussed recent transaction of events, she came in for routine appointment was noted to have a new left bundle branch block and her Rythmol  was discontinued.  Promptly she began having elevated heart rates continuously in the 120-130 range and she presented to the ED was given IV metoprolol , initially it appears like Toprol  was adjusted and increased however she was ultimately put back on Rythmol .  Since this time, she has been feeling well, she has no  formal complaints, she is somewhat unsure as to why it was stopped for her left bundle branch block but then restarted but overall she is feeling well. She denies chest pain, palpitations, dyspnea, pnd, orthopnea, n, v, dizziness, syncope, edema, weight gain, or early satiety.   ROS: Review of Systems  All other systems reviewed and are negative.    Studies Reviewed      Cardiac Studies & Procedures   ______________________________________________________________________________________________   STRESS TESTS  EXERCISE TOLERANCE TEST (ETT) 03/10/2024  Interpretation Summary   No ST deviation was noted.   Prior study not available for comparison.   Non-diagnostic EKG stress for ischemia due to baseline LBBB.   Average exercise capacity (5:30 min:s; 7.0 METS). Normal BP response to exercise. Only acheived 71% MPHR.   No exercise induced arrhythmias noted.   ECHOCARDIOGRAM  ECHOCARDIOGRAM COMPLETE 02/12/2024  Narrative ECHOCARDIOGRAM REPORT    Patient Name:   Pamela Underwood Date of Exam: 02/12/2024 Medical Rec #:  [id number]         Height:       67.0 in Accession #:    [id number]        Weight:       180.5 lb Date of Birth:  1956/03/22         BSA:          1.936 m Patient Age:    67 years          BP:  135/81 mmHg Patient Gender: F                 HR:           76 bpm. Exam Location:  [address]  Procedure: 2D Echo, Cardiac Doppler, Color Doppler and 3D Echo (Both Spectral and Color Flow Doppler were utilized during procedure).  Indications:    BBB (bundle branch block)  History:        Patient has prior history of Echocardiogram examinations, most recent 06/14/2009. Risk Factors:Dyslipidemia.  Sonographer:    Augustin Seals RDCS Referring Phys: [phone] JACKEE VEAR RADDLE DICK  IMPRESSIONS   1. Left ventricular ejection fraction, by estimation, is 55 to 60%. Left ventricular ejection fraction by 3D volume is 57 %. The left ventricle has normal function. The  left ventricle has no regional wall motion abnormalities. Left ventricular diastolic parameters are consistent with Grade I diastolic dysfunction (impaired relaxation). 2. Right ventricular systolic function is normal. The right ventricular size is normal. 3. The mitral valve is normal in structure. Trivial mitral valve regurgitation. No evidence of mitral stenosis. 4. The aortic valve is normal in structure. Aortic valve regurgitation is not visualized. No aortic stenosis is present. 5. Aortic dilatation noted. There is borderline dilatation of the ascending aorta, measuring 39 mm. 6. The inferior vena cava is normal in size with greater than 50% respiratory variability, suggesting right atrial pressure of 3 mmHg.  FINDINGS Left Ventricle: Left ventricular ejection fraction, by estimation, is 55 to 60%. Left ventricular ejection fraction by 3D volume is 57 %. The left ventricle has normal function. The left ventricle has no regional wall motion abnormalities. The left ventricular internal cavity size was normal in size. There is no left ventricular hypertrophy. Left ventricular diastolic parameters are consistent with Grade I diastolic dysfunction (impaired relaxation).  Right Ventricle: The right ventricular size is normal. No increase in right ventricular wall thickness. Right ventricular systolic function is normal.  Left Atrium: Left atrial size was normal in size.  Right Atrium: Right atrial size was normal in size.  Pericardium: There is no evidence of pericardial effusion.  Mitral Valve: The mitral valve is normal in structure. Trivial mitral valve regurgitation. No evidence of mitral valve stenosis.  Tricuspid Valve: The tricuspid valve is normal in structure. Tricuspid valve regurgitation is trivial. No evidence of tricuspid stenosis.  Aortic Valve: The aortic valve is normal in structure. Aortic valve regurgitation is not visualized. No aortic stenosis is present.  Pulmonic  Valve: The pulmonic valve was normal in structure. Pulmonic valve regurgitation is trivial. No evidence of pulmonic stenosis.  Aorta: Aortic dilatation noted. There is borderline dilatation of the ascending aorta, measuring 39 mm.  Venous: The inferior vena cava is normal in size with greater than 50% respiratory variability, suggesting right atrial pressure of 3 mmHg.  IAS/Shunts: No atrial level shunt detected by color flow Doppler.  Additional Comments: 3D was performed not requiring image post processing on an independent workstation and was normal.   LEFT VENTRICLE PLAX 2D LVIDd:         4.70 cm         Diastology LVIDs:         3.20 cm         LV e' medial:    7.18 cm/s LV PW:         0.90 cm         LV E/e' medial:  10.6 LV IVS:  0.70 cm         LV e' lateral:   6.74 cm/s LVOT diam:     2.10 cm         LV E/e' lateral: 11.3 LV SV:         79 LV SV Index:   41 LVOT Area:     3.46 cm        3D Volume EF LV 3D EF:    Left ventricul ar ejection fraction by 3D volume is 57 %.  3D Volume EF: 3D EF:        57 % LV EDV:       109 ml LV ESV:       47 ml LV SV:        62 ml  RIGHT VENTRICLE             IVC RV Basal diam:  3.60 cm     IVC diam: 1.20 cm RV Mid diam:    2.70 cm RV S prime:     12.80 cm/s  PULMONARY VEINS TAPSE (M-mode): 2.2 cm      Diastolic Velocity: 42.00 cm/s S/D Velocity:       1.50 Systolic Velocity:  62.80 cm/s  LEFT ATRIUM             Index        RIGHT ATRIUM           Index LA diam:        3.50 cm 1.81 cm/m   RA Area:     13.50 cm LA Vol (A2C):   23.6 ml 12.19 ml/m  RA Volume:   28.70 ml  14.82 ml/m LA Vol (A4C):   33.5 ml 17.30 ml/m LA Biplane Vol: 29.1 ml 15.03 ml/m AORTIC VALVE LVOT Vmax:   108.00 cm/s LVOT Vmean:  70.700 cm/s LVOT VTI:    0.228 m  AORTA Ao Root diam: 2.90 cm Ao Asc diam:  3.90 cm  MITRAL VALVE MV Area (PHT): 3.39 cm    SHUNTS MV Decel Time: 224 msec    Systemic VTI:  0.23 m MV E velocity: 76.00 cm/s   Systemic Diam: 2.10 cm MV A velocity: 94.90 cm/s MV E/A ratio:  0.80  Toribio Fuel MD Electronically signed by Toribio Fuel MD Signature Date/Time: 02/12/2024/6:00:11 PM    Final    MONITORS  LONG TERM MONITOR (3-14 DAYS) 02/10/2024  Narrative Patch Wear Time:  13 days and 14 hours  HR 58 - 143, average 80 bpm. 3070 SVT episodes, longest 1 hour 1 minute and 112 bpm, fastest 143 bpm for 2 minutes and 42 seconds No atrial fibrillation detected. Rare supraventricular ectopy. Rare ventricular ectopy. Symptom trigger episodes correspond to SVT   Cardiology referral placed based on protocol.   [facility] Cardiac Electrophysiology       ______________________________________________________________________________________________      Risk Assessment/Calculations           Physical Exam VS:  BP 118/74 (BP Location: Left Arm, Patient Position: Sitting)   Pulse 75   Ht 5' 7 (1.702 m)   Wt 183 lb 6.4 oz (83.2 kg)   SpO2 98%   BMI 28.72 kg/m        Wt Readings from Last 3 Encounters:  03/14/24 183 lb 6.4 oz (83.2 kg)  03/09/24 180 lb (81.6 kg)  01/14/24 180 lb 8 oz (81.9 kg)    GEN: Well nourished, well developed in no acute distress NECK: No JVD;  No carotid bruits CARDIAC: RRR, no murmurs, rubs, gallops RESPIRATORY:  Clear to auscultation without rales, wheezing or rhonchi  ABDOMEN: Soft, non-tender, non-distended EXTREMITIES:  No edema; No deformity   ASSESSMENT AND PLAN PSVT -symptoms are currently quiescent, she politely declined an EKG today, continue Rythmol  to 25 mg twice daily, metoprolol  50 mg daily.  OSA -she has been on CPAP for some time, has appointment to see Dr. Shlomo in the near future for sleep evaluation, it appears this was arranged by her PCP to see if she could possibly be approved for tirzepatide .  LBBB -declined EKG today.  She had an ETT which was normal, echocardiogram was as well.  She is asymptomatic.        Dispo: Keep follow-up with Dr. Shlomo for OSA evaluation, keep follow-up with EP, otherwise follow-up with general cardiology in 1 year with Dr. Shlomo to establish care with her.  Signed, Delon JAYSON Hoover, NP

## 2024-03-10 LAB — EXERCISE TOLERANCE TEST
Angina Index: 0
Duke Treadmill Score: 6
Estimated workload: 7
Exercise duration (min): 5 min
Exercise duration (sec): 30 s
MPHR: 153 {beats}/min
Peak HR: 109 {beats}/min
Percent HR: 71 %
RPE: 19
Rest HR: 70 {beats}/min
ST Depression (mm): 0 mm

## 2024-03-14 ENCOUNTER — Encounter: Payer: Self-pay | Admitting: Cardiology

## 2024-03-14 ENCOUNTER — Ambulatory Visit: Attending: Cardiology | Admitting: Cardiology

## 2024-03-14 ENCOUNTER — Ambulatory Visit: Payer: Self-pay | Admitting: Cardiology

## 2024-03-14 VITALS — BP 118/74 | HR 75 | Ht 67.0 in | Wt 183.4 lb

## 2024-03-14 DIAGNOSIS — G4733 Obstructive sleep apnea (adult) (pediatric): Secondary | ICD-10-CM

## 2024-03-14 DIAGNOSIS — I447 Left bundle-branch block, unspecified: Secondary | ICD-10-CM

## 2024-03-14 DIAGNOSIS — I471 Supraventricular tachycardia, unspecified: Secondary | ICD-10-CM | POA: Diagnosis not present

## 2024-03-14 NOTE — Patient Instructions (Signed)
 Medication Instructions:   Your physician recommends that you continue on your current medications as directed. Please refer to the Current Medication list given to you today.    *If you need a refill on your cardiac medications before your next appointment, please call your pharmacy*   Lab Work:  NONE ORDERED  TODAY   If you have labs (blood work) drawn today and your tests are completely normal, you will receive your results only by: MyChart Message (if you have MyChart) OR A paper copy in the mail If you have any lab test that is abnormal or we need to change your treatment, we will call you to review the results.  Testing/Procedures:  NONE ORDERED  TODAY      Follow-Up: At [facility], you and your health needs are our priority.  As part of our continuing mission to provide you with exceptional heart care, our providers are all part of one team.  This team includes your primary Cardiologist (physician) and Advanced Practice Providers or APPs (Physician Assistants and Nurse Practitioners) who all work together to provide you with the care you need, when you need it.  Your next appointment:   1 year(s)  Provider:   Dr. Shlomo   We recommend signing up for the patient portal called MyChart.  Sign up information is provided on this After Visit Summary.  MyChart is used to connect with patients for Virtual Visits (Telemedicine).  Patients are able to view lab/test results, encounter notes, upcoming appointments, etc.  Non-urgent messages can be sent to your provider as well.   To learn more about what you can do with MyChart, go to [email or web address].   Other Instructions

## 2024-03-18 ENCOUNTER — Other Ambulatory Visit (HOSPITAL_COMMUNITY): Payer: Self-pay

## 2024-03-23 ENCOUNTER — Other Ambulatory Visit (HOSPITAL_COMMUNITY): Payer: Self-pay

## 2024-03-27 NOTE — Progress Notes (Unsigned)
 Sleep Medicine CONSULT Note    Date:  03/28/2024   ID:  Pamela Underwood, DOB 1955-10-24, MRN [id number]  PCP:  Roanna Ezekiel NOVAK, MD  Cardiologist: None   Chief Complaint  Patient presents with   New Patient (Initial Visit)    OSA    History of Present Illness:  Pamela Underwood is a 68 y.o. female who is being seen today for the evaluation of OSA at the request of Ezekiel Roanna, MD.  This is a 68yo female with a hx of atrial tachycardia, breast CA, chronic anxiety, HLD, Insomnia, and OSA on CPAP.  She had been followed by Dr. Dann in the past for PAT and by me for OSA but I had not seen her since 2018 and is now referred for evaluation of her OSA as well as see if patient could get insurance to approve tirzepatide  for weight loss..    She is doing well with her PAP device but says that it is old and over 67 years old and she wants a new one.  She says her ears are starting to fall apart.  She tolerates the fullface mask and feels the pressure is adequate.  On occasion she will feel rested in the morning but she does have some mornings where she does not feel like she has slept well and does get sleepy in the afternoon.  An Epworth Sleepiness Scale score was calculated the office today and this endorsed at 1 arguing against residual daytime sleepiness.   Past Medical History:  Diagnosis Date   Arthritis    Atrial tachycardia    Back pain    occasionally   Breast cancer (HCC) 02/22/14   right upper inner   Chronic anxiety    takes Xanax  daily as needed when heart rate going up   Colitis    hx-one   Hyperlipidemia    Insomnia    takes Ambien  nightly as needed   Overweight (BMI 25.0-29.9) 12/10/2016   PSVT (paroxysmal supraventricular tachycardia)    S/P radiation therapy 03/28/2014 through 04/27/2014                                03/28/2014 through 04/27/2014                                                                         Right breast 4250 cGy 17 sessions,  right breast boost 1000 cGy in 4 sessions (hypofractionated)                          Sleep apnea    uses a CPAP    Past Surgical History:  Procedure Laterality Date   BREAST BIOPSY Left 05/02/1999   benign   BREAST LUMPECTOMY WITH AXILLARY LYMPH NODE BIOPSY Right 02/22/14   upper inner   COLONOSCOPY     CYSTOSCOPY     KNEE ARTHROSCOPY WITH MEDIAL MENISECTOMY Left 03/31/2013   Procedure: LEFT KNEE ARTHROSCOPY WITH PARTIAL MEDIAL MENISECTOMY, CHONDROPLASTY OF PATELLA  FEMORAL JOINT, EXCISION OF MEDIAL PLICA, PARTIAL LATERAL MENISECTOMY;  Surgeon: Toribio JULIANNA Chancy, MD;  Location: [facility];  Service: Orthopedics;  Laterality: Left;   TOTAL KNEE ARTHROPLASTY Left 06/29/2013   Procedure: TOTAL KNEE ARTHROPLASTY;  Surgeon: Toribio JULIANNA Chancy, MD;  Location: [facility] OR;  Service: Orthopedics;  Laterality: Left;   TOTAL KNEE ARTHROPLASTY Right 03/12/2016   TOTAL KNEE ARTHROPLASTY Right 03/12/2016   Procedure: RIGHT TOTAL KNEE ARTHROPLASTY;  Surgeon: Toribio JULIANNA Chancy, MD;  Location: [facility] OR;  Service: Orthopedics;  Laterality: Right;   TUBAL LIGATION      Current Medications: Current Meds  Medication Sig   ALPRAZolam  (XANAX ) 0.25 MG tablet Take 1 tablet (0.25 mg total) by mouth daily as needed.   aspirin  81 MG tablet Take 81 mg by mouth daily.   levothyroxine  (EUTHYROX ) 50 MCG tablet Take 1 tablet (50 mcg total) by mouth daily before meal.   metoprolol  succinate (TOPROL -XL) 50 MG 24 hr tablet Take 1 tablet (50 mg total) by mouth in the morning and at bedtime.   propafenone  (RYTHMOL  SR) 225 MG 12 hr capsule Take 1 capsule (225 mg total) by mouth 2 (two) times daily.   propafenone  (RYTHMOL  SR) 225 MG 12 hr capsule Take 1 capsule (225 mg total) by mouth every 12 (twelve) hours.   rosuvastatin  (CRESTOR ) 10 MG tablet Take 1 tablet (10 mg total) by mouth daily.   Semaglutide , 2 MG/DOSE, (OZEMPIC , 2 MG/DOSE,) 8 MG/3ML SOPN Inject 2 mg into the skin once a week.   VITAMIN D, CHOLECALCIFEROL, PO Take  1,000 Units by mouth daily.    zolpidem  (AMBIEN ) 10 MG tablet Take 1 tablet (10 mg total) by mouth at bedtime as needed.    Allergies:   Escitalopram oxalate and Cardizem   [diltiazem  hcl]   Social History   Socioeconomic History   Marital status: Married    Spouse name: Not on file   Number of children: Not on file   Years of education: Not on file   Highest education level: Not on file  Occupational History   Not on file  Tobacco Use   Smoking status: Never   Smokeless tobacco: Never  Substance and Sexual Activity   Alcohol use: Yes    Comment: socailly   Drug use: No   Sexual activity: Yes    Birth control/protection: Post-menopausal    Comment: menarche age 89, g22, P42, 1st live birth age 45, menopause age [age]  Other Topics Concern   Not on file  Social History Narrative   Not on file   Social Drivers of Health   Financial Resource Strain: Not on file  Food Insecurity: Not on file  Transportation Needs: Not on file  Physical Activity: Not on file  Stress: Not on file  Social Connections: Not on file     Family History:  The patient's family history includes Diabetes in her father; Heart disease in her father; Hypertension in her father.   ROS:   Please see the history of present illness.    ROS All other systems reviewed and are negative.      No data to display             PHYSICAL EXAM:   VS:  BP 118/84 (BP Location: Left Arm, Patient Position: Sitting)   Pulse 73   Ht 5' 7 (1.702 m)   Wt 183 lb 3.2 oz (83.1 kg)   SpO2 98%   BMI 28.69 kg/m    GEN: Well nourished, well developed, in no acute distress  HEENT: normal  Neck: no JVD, carotid bruits, or masses Cardiac: RRR; no murmurs,  rubs, or gallops,no edema.  Intact distal pulses bilaterally.  Respiratory:  clear to auscultation bilaterally, normal work of breathing GI: soft, nontender, nondistended, + BS MS: no deformity or atrophy  Skin: warm and dry, no rash Neuro:  Alert and Oriented x  3, Strength and sensation are intact Psych: euthymic mood, full affect  Wt Readings from Last 3 Encounters:  03/28/24 183 lb 3.2 oz (83.1 kg)  03/14/24 183 lb 6.4 oz (83.2 kg)  03/09/24 180 lb (81.6 kg)      Studies/Labs Reviewed:   PAP compliance download  Recent Labs: 01/19/2024: BUN 10; Creatinine, Ser 0.87; Hemoglobin 14.1; Magnesium  1.9; Platelets 204; Potassium 4.1; Sodium 139; TSH 1.557     ASSESSMENT:    1. OSA (obstructive sleep apnea)   2. Paroxysmal supraventricular tachycardia      PLAN:  In order of problems listed above:  OSA - The patient is tolerating PAP therapy well without any problems. The patient has been using and benefiting from PAP use and will continue to benefit from therapy.  - Her device is now over 39 years old and she says it is falling apart - She also wants to start on a weight loss medication and would like to try to get insurance to pay for it so we need to document that she has moderate to severe sleep apnea - Will set her up for a split-night sleep study and see her after the study  Paroxysmal atrial tachycardia - Followed by Dr. Inocencio - Denies any palpitations - Continue Rythmol  225 mg as well as Toprol  XL 50 mg twice daily with as needed refills   Time Spent: 20 minutes total time of encounter, including 15 minutes spent in face-to-face patient care on the date of this encounter. This time includes coordination of care and counseling regarding above mentioned problem list. Remainder of non-face-to-face time involved reviewing chart documents/testing relevant to the patient encounter and documentation in the medical record. I have independently reviewed documentation from referring provider  Medication Adjustments/Labs and Tests Ordered: Current medicines are reviewed at length with the patient today.  Concerns regarding medicines are outlined above.  Medication changes, Labs and Tests ordered today are listed in the Patient  Instructions below.  There are no Patient Instructions on file for this visit.  Follow-up after split-night sleep study completed   Signed, Wilbert Bihari, MD  03/28/2024 8:19 AM    [facility] [address] [place], [place], [place]  [address] Phone: [phone]; Fax: [phone]

## 2024-03-28 ENCOUNTER — Other Ambulatory Visit (HOSPITAL_COMMUNITY): Payer: Self-pay

## 2024-03-28 ENCOUNTER — Ambulatory Visit: Attending: Cardiology | Admitting: Cardiology

## 2024-03-28 ENCOUNTER — Encounter: Payer: Self-pay | Admitting: Cardiology

## 2024-03-28 VITALS — BP 118/84 | HR 73 | Ht 67.0 in | Wt 183.2 lb

## 2024-03-28 DIAGNOSIS — I471 Supraventricular tachycardia, unspecified: Secondary | ICD-10-CM | POA: Diagnosis not present

## 2024-03-28 DIAGNOSIS — G4733 Obstructive sleep apnea (adult) (pediatric): Secondary | ICD-10-CM

## 2024-03-28 NOTE — Patient Instructions (Signed)
 Medication Instructions:  No medication changes were made at this visit. Continue current regimen.   *If you need a refill on your cardiac medications before your next appointment, please call your pharmacy*  Lab Work: None ordered today. If you have labs (blood work) drawn today and your tests are completely normal, you will receive your results only by: MyChart Message (if you have MyChart) OR A paper copy in the mail If you have any lab test that is abnormal or we need to change your treatment, we will call you to review the results.  Testing/Procedures: Your physician has recommended that you have a sleep study. This test records several body functions during sleep, including: brain activity, eye movement, oxygen and carbon dioxide blood levels, heart rate and rhythm, breathing rate and rhythm, the flow of air through your mouth and nose, snoring, body muscle movements, and chest and belly movement.  Follow-Up: At [facility], you and your health needs are our priority.  As part of our continuing mission to provide you with exceptional heart care, our providers are all part of one team.  This team includes your primary Cardiologist (physician) and Advanced Practice Providers or APPs (Physician Assistants and Nurse Practitioners) who all work together to provide you with the care you need, when you need it.  Your next appointment:   Per sleep team  Provider:   Wilbert Bihari, MD

## 2024-04-04 ENCOUNTER — Ambulatory Visit: Attending: Student | Admitting: Student

## 2024-04-04 ENCOUNTER — Encounter: Payer: Self-pay | Admitting: Student

## 2024-04-04 VITALS — BP 124/70 | HR 83 | Ht 67.0 in | Wt 183.0 lb

## 2024-04-04 DIAGNOSIS — I471 Supraventricular tachycardia, unspecified: Secondary | ICD-10-CM | POA: Diagnosis not present

## 2024-04-04 DIAGNOSIS — I447 Left bundle-branch block, unspecified: Secondary | ICD-10-CM

## 2024-04-04 NOTE — Patient Instructions (Signed)
 Medication Instructions:  No medication changes today. *If you need a refill on your cardiac medications before your next appointment, please call your pharmacy*  Lab Work: No labwork ordered today. If you have labs (blood work) drawn today and your tests are completely normal, you will receive your results only by: MyChart Message (if you have MyChart) OR A paper copy in the mail If you have any lab test that is abnormal or we need to change your treatment, we will call you to review the results.  Testing/Procedures: No testing ordered today  Follow-Up: At [facility], you and your health needs are our priority.  As part of our continuing mission to provide you with exceptional heart care, our providers are all part of one team.  This team includes your primary Cardiologist (physician) and Advanced Practice Providers or APPs (Physician Assistants and Nurse Practitioners) who all work together to provide you with the care you need, when you need it.  Your next appointment:   6 month(s)  Provider:   You may see Will Cortland Ding, MD or one of the following Advanced Practice Providers on your designated Care Team:   Mertha Abrahams, [place] [address] [place], PA-C Suzann Riddle, NP Creighton Doffing, NP    We recommend signing up for the patient portal called "MyChart".  Sign up information is provided on this After Visit Summary.  MyChart is used to connect with patients for Virtual Visits (Telemedicine).  Patients are able to view lab/test results, encounter notes, upcoming appointments, etc.  Non-urgent messages can be sent to your provider as well.   To learn more about what you can do with MyChart, go to [email or web address].

## 2024-04-04 NOTE — Progress Notes (Signed)
  Electrophysiology Office Note:   Date:  04/04/2024  ID:  Pamela Underwood, Pamela Underwood 1956-05-24, MRN [id number]  Primary Cardiologist: Wilbert Bihari, MD Electrophysiologist: Soyla Gladis Norton, MD   Electrophysiologist:  Soyla Gladis Norton, MD      History of Present Illness:   Pamela Underwood is a 68 y.o. female with h/o SVT, hyperlipidemia, breast cancer s/p surgery and radiation, obesity, hypothyroidism, sleep apnea seen today for routine electrophysiology followup.   Seen 01/12/2024 and rhythmol stopped due to concerns for prolonged QT interval.   Presented 01/19/2024 with recurrent SVT. Review of records showed chronic LBBB and EKG corrected to relatively stable QT. Felt stable to resume rhythmol with exercise testing.   ETT 03/10/2024 with no QRS changes with exercise on propafenone .    Since last being seen in our clinic the patient reports doing very well. No breakthrough arrhythmia of which she is aware.  she denies chest pain, palpitations, dyspnea, PND, orthopnea, nausea, vomiting, dizziness, syncope, edema, weight gain, or early satiety.   Review of systems complete and found to be negative unless listed in HPI.   EP Information / Studies Reviewed:    EKG is ordered today. Personal review as below.  EKG Interpretation Date/Time:  Monday April 04 2024 11:06:13 EST Ventricular Rate:  83 PR Interval:  220 QRS Duration:  158 QT Interval:  444 QTC Calculation: 521 R Axis:   -43  Text Interpretation: Sinus rhythm with 1st degree A-V block Left axis deviation ,chronic Confirmed by Lesia Sharper [phone]) on 04/04/2024 11:09:13 AM    Arrhythmia/Device History No specialty comments available.   Physical Exam:   VS:  BP 124/70   Pulse 83   Ht 5' 7 (1.702 m)   Wt 183 lb (83 kg)   SpO2 95%   BMI 28.66 kg/m    Wt Readings from Last 3 Encounters:  04/04/24 183 lb (83 kg)  03/28/24 183 lb 3.2 oz (83.1 kg)  03/14/24 183 lb 6.4 oz (83.2 kg)     GEN: No acute  distress NECK: No JVD; No carotid bruits CARDIAC: Regular rate and rhythm, no murmurs, rubs, gallops RESPIRATORY:  Clear to auscultation without rales, wheezing or rhonchi  ABDOMEN: Soft, non-tender, non-distended EXTREMITIES:  No edema; No deformity   ASSESSMENT AND PLAN:    SVT Atrial tachycardia EKG today shows NSR with stable intervals Continue propafenone  225 mg BID No breakthrough arrhythmia of which the pt is aware.   OSA  Encouraged nightly CPAP  HLD Continue statin   Follow up with EP Team in 6 months  Signed, Sharper Prentice Lesia, PA-C

## 2024-04-05 ENCOUNTER — Telehealth: Payer: Self-pay

## 2024-04-05 NOTE — Telephone Encounter (Signed)
   Snoring: YES TIRED: YES OBSERVED APNEA: NO BLOOD PRESSURE: NO BMI: NO AGE >50: YES  NECK >40 cm : YES GENDER FEMALE: NO  STOP BANG 4  Call to patient to complete STOP Bang. See responses.

## 2024-04-05 NOTE — Telephone Encounter (Signed)
-----   Message from Nurse Avelina GAILS sent at 03/29/2024 12:43 PM EST ----- We need the Stop [place] questionnaire completed in order to do the PA. Thank you, Macario

## 2024-04-06 ENCOUNTER — Telehealth: Payer: Self-pay

## 2024-04-06 NOTE — Telephone Encounter (Addendum)
**Note De-Identified Celenia Hruska Obfuscation** Per the [facility] provider portal, this Split night Sleep Study has been approved from 04/06/2024 - 07/05/2024. Outpatient Authorization [id number]  I called the pt to advise her of this approval and to offer her the [facility] phone number but I got no answer so I left a message on her VM asking her to call Macario back at Dr Dorine office at [phone].

## 2024-04-11 ENCOUNTER — Ambulatory Visit: Admitting: Cardiology

## 2024-04-18 ENCOUNTER — Other Ambulatory Visit (HOSPITAL_COMMUNITY): Payer: Self-pay

## 2024-04-27 ENCOUNTER — Other Ambulatory Visit: Payer: Self-pay

## 2024-04-27 ENCOUNTER — Other Ambulatory Visit (HOSPITAL_COMMUNITY): Payer: Self-pay

## 2024-04-27 MED ORDER — LEVOTHYROXINE SODIUM 50 MCG PO TABS
50.0000 ug | ORAL_TABLET | Freq: Every day | ORAL | 1 refills | Status: AC
  Filled 2024-04-27: qty 90, 90d supply, fill #0

## 2024-05-02 ENCOUNTER — Other Ambulatory Visit (HOSPITAL_COMMUNITY): Payer: Self-pay

## 2024-05-02 MED ORDER — ALPRAZOLAM 0.25 MG PO TABS
0.2500 mg | ORAL_TABLET | Freq: Every day | ORAL | 1 refills | Status: AC | PRN
  Filled 2024-05-02 – 2024-05-20 (×2): qty 30, 30d supply, fill #0

## 2024-05-03 ENCOUNTER — Other Ambulatory Visit (HOSPITAL_COMMUNITY): Payer: Self-pay

## 2024-05-03 ENCOUNTER — Encounter (HOSPITAL_COMMUNITY): Payer: Self-pay

## 2024-05-12 ENCOUNTER — Other Ambulatory Visit (HOSPITAL_COMMUNITY): Payer: Self-pay

## 2024-05-20 ENCOUNTER — Other Ambulatory Visit (HOSPITAL_COMMUNITY): Payer: Self-pay

## 2024-05-26 ENCOUNTER — Other Ambulatory Visit (HOSPITAL_COMMUNITY): Payer: Self-pay

## 2024-05-30 ENCOUNTER — Other Ambulatory Visit (HOSPITAL_COMMUNITY): Payer: Self-pay

## 2024-05-30 MED ORDER — ZOLPIDEM TARTRATE 10 MG PO TABS
10.0000 mg | ORAL_TABLET | Freq: Every evening | ORAL | 2 refills | Status: AC | PRN
  Filled 2024-05-30: qty 30, 30d supply, fill #0
  Filled 2024-06-30: qty 30, 30d supply, fill #1

## 2024-05-31 ENCOUNTER — Other Ambulatory Visit (HOSPITAL_COMMUNITY): Payer: Self-pay

## 2024-06-04 ENCOUNTER — Other Ambulatory Visit (HOSPITAL_COMMUNITY): Payer: Self-pay

## 2024-06-21 ENCOUNTER — Other Ambulatory Visit (HOSPITAL_COMMUNITY): Payer: Self-pay

## 2024-06-23 ENCOUNTER — Ambulatory Visit (HOSPITAL_BASED_OUTPATIENT_CLINIC_OR_DEPARTMENT_OTHER): Admitting: Cardiology

## 2024-06-30 ENCOUNTER — Other Ambulatory Visit: Payer: Self-pay

## 2024-06-30 ENCOUNTER — Other Ambulatory Visit (HOSPITAL_COMMUNITY): Payer: Self-pay

## 2024-08-29 ENCOUNTER — Ambulatory Visit (HOSPITAL_BASED_OUTPATIENT_CLINIC_OR_DEPARTMENT_OTHER): Admitting: Cardiology
